# Patient Record
Sex: Female | Born: 1940
Health system: Southern US, Community
[De-identification: ages and names within clinical notes are randomized; demographics above are authoritative.]

## PROBLEM LIST (undated history)

## (undated) DIAGNOSIS — Z9889 Other specified postprocedural states: Secondary | ICD-10-CM

## (undated) DIAGNOSIS — K219 Gastro-esophageal reflux disease without esophagitis: Secondary | ICD-10-CM

## (undated) DIAGNOSIS — R0600 Dyspnea, unspecified: Secondary | ICD-10-CM

## (undated) DIAGNOSIS — M1990 Unspecified osteoarthritis, unspecified site: Secondary | ICD-10-CM

## (undated) DIAGNOSIS — Z8719 Personal history of other diseases of the digestive system: Secondary | ICD-10-CM

## (undated) DIAGNOSIS — E7800 Pure hypercholesterolemia, unspecified: Secondary | ICD-10-CM

## (undated) DIAGNOSIS — E039 Hypothyroidism, unspecified: Secondary | ICD-10-CM

## (undated) DIAGNOSIS — R112 Nausea with vomiting, unspecified: Secondary | ICD-10-CM

## (undated) DIAGNOSIS — D649 Anemia, unspecified: Secondary | ICD-10-CM

## (undated) HISTORY — DX: Pure hypercholesterolemia, unspecified: E78.00

## (undated) HISTORY — PX: COLONOSCOPY: SHX174

## (undated) HISTORY — PX: UPPER GASTROINTESTINAL ENDOSCOPY: SHX188

## (undated) HISTORY — DX: Hypothyroidism, unspecified: E03.9

---

## 1983-11-27 HISTORY — PX: TUBAL LIGATION: SHX77

## 1999-01-12 ENCOUNTER — Other Ambulatory Visit: Admission: RE | Admit: 1999-01-12 | Discharge: 1999-01-12 | Payer: Self-pay | Admitting: Obstetrics and Gynecology

## 2000-02-12 ENCOUNTER — Other Ambulatory Visit: Admission: RE | Admit: 2000-02-12 | Discharge: 2000-02-12 | Payer: Self-pay | Admitting: Obstetrics and Gynecology

## 2000-08-05 ENCOUNTER — Other Ambulatory Visit: Admission: RE | Admit: 2000-08-05 | Discharge: 2000-08-05 | Payer: Self-pay | Admitting: Obstetrics and Gynecology

## 2001-10-09 ENCOUNTER — Other Ambulatory Visit: Admission: RE | Admit: 2001-10-09 | Discharge: 2001-10-09 | Payer: Self-pay | Admitting: Obstetrics and Gynecology

## 2003-02-15 ENCOUNTER — Other Ambulatory Visit: Admission: RE | Admit: 2003-02-15 | Discharge: 2003-02-15 | Payer: Self-pay | Admitting: Obstetrics and Gynecology

## 2004-05-31 ENCOUNTER — Other Ambulatory Visit: Admission: RE | Admit: 2004-05-31 | Discharge: 2004-05-31 | Payer: Self-pay | Admitting: Obstetrics and Gynecology

## 2006-01-23 ENCOUNTER — Other Ambulatory Visit: Admission: RE | Admit: 2006-01-23 | Discharge: 2006-01-23 | Payer: Self-pay | Admitting: Obstetrics and Gynecology

## 2008-11-29 ENCOUNTER — Other Ambulatory Visit: Admission: RE | Admit: 2008-11-29 | Discharge: 2008-11-29 | Payer: Self-pay | Admitting: Obstetrics and Gynecology

## 2010-12-27 ENCOUNTER — Encounter: Payer: Self-pay | Admitting: Emergency Medicine

## 2012-01-10 DIAGNOSIS — IMO0001 Reserved for inherently not codable concepts without codable children: Secondary | ICD-10-CM | POA: Diagnosis not present

## 2012-01-10 DIAGNOSIS — M999 Biomechanical lesion, unspecified: Secondary | ICD-10-CM | POA: Diagnosis not present

## 2012-01-10 DIAGNOSIS — M461 Sacroiliitis, not elsewhere classified: Secondary | ICD-10-CM | POA: Diagnosis not present

## 2012-02-21 DIAGNOSIS — M999 Biomechanical lesion, unspecified: Secondary | ICD-10-CM | POA: Diagnosis not present

## 2012-02-21 DIAGNOSIS — M461 Sacroiliitis, not elsewhere classified: Secondary | ICD-10-CM | POA: Diagnosis not present

## 2012-02-21 DIAGNOSIS — IMO0001 Reserved for inherently not codable concepts without codable children: Secondary | ICD-10-CM | POA: Diagnosis not present

## 2012-07-14 DIAGNOSIS — Z79899 Other long term (current) drug therapy: Secondary | ICD-10-CM | POA: Diagnosis not present

## 2012-07-14 DIAGNOSIS — F329 Major depressive disorder, single episode, unspecified: Secondary | ICD-10-CM | POA: Diagnosis not present

## 2012-07-14 DIAGNOSIS — R9431 Abnormal electrocardiogram [ECG] [EKG]: Secondary | ICD-10-CM | POA: Diagnosis not present

## 2012-07-14 DIAGNOSIS — Z0000 Encounter for general adult medical examination without abnormal findings: Secondary | ICD-10-CM | POA: Diagnosis not present

## 2012-07-14 DIAGNOSIS — E559 Vitamin D deficiency, unspecified: Secondary | ICD-10-CM | POA: Diagnosis not present

## 2012-07-14 DIAGNOSIS — F3289 Other specified depressive episodes: Secondary | ICD-10-CM | POA: Diagnosis not present

## 2012-07-14 DIAGNOSIS — E785 Hyperlipidemia, unspecified: Secondary | ICD-10-CM | POA: Diagnosis not present

## 2012-07-30 DIAGNOSIS — E538 Deficiency of other specified B group vitamins: Secondary | ICD-10-CM | POA: Diagnosis not present

## 2012-08-06 DIAGNOSIS — Z23 Encounter for immunization: Secondary | ICD-10-CM | POA: Diagnosis not present

## 2012-08-06 DIAGNOSIS — E538 Deficiency of other specified B group vitamins: Secondary | ICD-10-CM | POA: Diagnosis not present

## 2012-08-07 DIAGNOSIS — Z1231 Encounter for screening mammogram for malignant neoplasm of breast: Secondary | ICD-10-CM | POA: Diagnosis not present

## 2012-08-11 DIAGNOSIS — R9431 Abnormal electrocardiogram [ECG] [EKG]: Secondary | ICD-10-CM | POA: Diagnosis not present

## 2012-09-23 DIAGNOSIS — Z01419 Encounter for gynecological examination (general) (routine) without abnormal findings: Secondary | ICD-10-CM | POA: Diagnosis not present

## 2012-09-23 DIAGNOSIS — Z124 Encounter for screening for malignant neoplasm of cervix: Secondary | ICD-10-CM | POA: Diagnosis not present

## 2012-09-23 DIAGNOSIS — Z0000 Encounter for general adult medical examination without abnormal findings: Secondary | ICD-10-CM | POA: Diagnosis not present

## 2013-03-16 DIAGNOSIS — M5412 Radiculopathy, cervical region: Secondary | ICD-10-CM | POA: Diagnosis not present

## 2013-03-16 DIAGNOSIS — M999 Biomechanical lesion, unspecified: Secondary | ICD-10-CM | POA: Diagnosis not present

## 2013-03-16 DIAGNOSIS — IMO0001 Reserved for inherently not codable concepts without codable children: Secondary | ICD-10-CM | POA: Diagnosis not present

## 2013-03-16 DIAGNOSIS — IMO0002 Reserved for concepts with insufficient information to code with codable children: Secondary | ICD-10-CM | POA: Diagnosis not present

## 2013-03-16 DIAGNOSIS — M9981 Other biomechanical lesions of cervical region: Secondary | ICD-10-CM | POA: Diagnosis not present

## 2013-08-20 DIAGNOSIS — Z1231 Encounter for screening mammogram for malignant neoplasm of breast: Secondary | ICD-10-CM | POA: Diagnosis not present

## 2013-08-31 DIAGNOSIS — E079 Disorder of thyroid, unspecified: Secondary | ICD-10-CM | POA: Diagnosis not present

## 2013-08-31 DIAGNOSIS — Z0000 Encounter for general adult medical examination without abnormal findings: Secondary | ICD-10-CM | POA: Diagnosis not present

## 2013-08-31 DIAGNOSIS — E785 Hyperlipidemia, unspecified: Secondary | ICD-10-CM | POA: Diagnosis not present

## 2013-08-31 DIAGNOSIS — E559 Vitamin D deficiency, unspecified: Secondary | ICD-10-CM | POA: Diagnosis not present

## 2013-08-31 DIAGNOSIS — E538 Deficiency of other specified B group vitamins: Secondary | ICD-10-CM | POA: Diagnosis not present

## 2013-09-07 DIAGNOSIS — E559 Vitamin D deficiency, unspecified: Secondary | ICD-10-CM | POA: Diagnosis not present

## 2013-09-07 DIAGNOSIS — E538 Deficiency of other specified B group vitamins: Secondary | ICD-10-CM | POA: Diagnosis not present

## 2013-09-07 DIAGNOSIS — Z1331 Encounter for screening for depression: Secondary | ICD-10-CM | POA: Diagnosis not present

## 2013-09-07 DIAGNOSIS — N951 Menopausal and female climacteric states: Secondary | ICD-10-CM | POA: Diagnosis not present

## 2013-09-07 DIAGNOSIS — F329 Major depressive disorder, single episode, unspecified: Secondary | ICD-10-CM | POA: Diagnosis not present

## 2013-09-07 DIAGNOSIS — IMO0002 Reserved for concepts with insufficient information to code with codable children: Secondary | ICD-10-CM | POA: Diagnosis not present

## 2013-09-07 DIAGNOSIS — E079 Disorder of thyroid, unspecified: Secondary | ICD-10-CM | POA: Diagnosis not present

## 2013-09-07 DIAGNOSIS — F3289 Other specified depressive episodes: Secondary | ICD-10-CM | POA: Diagnosis not present

## 2013-09-07 DIAGNOSIS — Z0000 Encounter for general adult medical examination without abnormal findings: Secondary | ICD-10-CM | POA: Diagnosis not present

## 2013-09-07 DIAGNOSIS — E785 Hyperlipidemia, unspecified: Secondary | ICD-10-CM | POA: Diagnosis not present

## 2013-09-16 DIAGNOSIS — Z1212 Encounter for screening for malignant neoplasm of rectum: Secondary | ICD-10-CM | POA: Diagnosis not present

## 2013-10-26 ENCOUNTER — Telehealth: Payer: Self-pay | Admitting: Gynecology

## 2013-10-26 MED ORDER — ESTROPIPATE 0.75 MG PO TABS: 0.7500 mg | ORAL_TABLET | Freq: Every day | ORAL | Status: DC

## 2013-10-26 MED ORDER — MEDROXYPROGESTERONE ACETATE 2.5 MG PO TABS: 2.5000 mg | ORAL_TABLET | Freq: Every day | ORAL | Status: DC

## 2013-10-26 NOTE — Telephone Encounter (Signed)
AEX was 09/23/12 #90 with refills x 1 year was sent for both the Provera and Ortho Est.  Aex scheduled for 12/14/13 with Dr. Reina Fuse Patient had 3D mammogram done 08/20/13, requested to have MMG sent over to [place].  Patient notified of refills being sent.

## 2013-10-26 NOTE — Telephone Encounter (Signed)
Patient needs refill for  Medroxyprogesterone 2.5 mg Estroipate 0.75 mg  [facility] on [address]  Has annual scheduled for 12/14/13 with lathrop

## 2013-11-26 DIAGNOSIS — E039 Hypothyroidism, unspecified: Secondary | ICD-10-CM | POA: Diagnosis present

## 2013-11-26 HISTORY — DX: Hypothyroidism, unspecified: E03.9

## 2013-12-14 ENCOUNTER — Ambulatory Visit: Payer: Self-pay | Admitting: Gynecology

## 2014-01-01 ENCOUNTER — Encounter: Payer: Self-pay | Admitting: Obstetrics and Gynecology

## 2014-01-04 ENCOUNTER — Ambulatory Visit: Payer: Self-pay | Admitting: Gynecology

## 2014-01-08 ENCOUNTER — Ambulatory Visit: Payer: Self-pay | Admitting: Gynecology

## 2014-01-13 ENCOUNTER — Ambulatory Visit: Payer: Self-pay | Admitting: Gynecology

## 2014-01-13 ENCOUNTER — Encounter: Payer: Self-pay | Admitting: Gynecology

## 2014-01-13 ENCOUNTER — Ambulatory Visit (INDEPENDENT_AMBULATORY_CARE_PROVIDER_SITE_OTHER): Payer: Medicare Other | Admitting: Gynecology

## 2014-01-13 VITALS — BP 121/85 | HR 61 | Resp 12 | Ht 66.75 in | Wt 152.0 lb

## 2014-01-13 DIAGNOSIS — Z01419 Encounter for gynecological examination (general) (routine) without abnormal findings: Secondary | ICD-10-CM

## 2014-01-13 DIAGNOSIS — Z124 Encounter for screening for malignant neoplasm of cervix: Secondary | ICD-10-CM

## 2014-01-13 DIAGNOSIS — Z79890 Hormone replacement therapy (postmenopausal): Secondary | ICD-10-CM

## 2014-01-13 MED ORDER — ESTROPIPATE 0.75 MG PO TABS: 0.7500 mg | ORAL_TABLET | Freq: Every day | ORAL | Status: DC

## 2014-01-13 MED ORDER — MEDROXYPROGESTERONE ACETATE 2.5 MG PO TABS: 2.5000 mg | ORAL_TABLET | Freq: Every day | ORAL | Status: DC

## 2014-01-13 NOTE — Patient Instructions (Signed)

## 2014-01-13 NOTE — Progress Notes (Signed)
73 y.o. Married Caucasian female   G2P2002 here for annual exam. Pt reports menses are absent due to menopause. She does have night sweats. She does not report post-menopasual bleeding.  Pt is on HRT since menopause.  She reports very happy on regimen and is not interested in stopping.  Pt reports no bleeding.  Had all her labs done with Dr Wellington Hampshire.  Pt refused colonoscopy, had hemocult done with PCP  No LMP recorded.          Sexually active: yes  The current method of family planning is post menopausal status.    Exercising: yes  gym 3-4x/wk Last pap: 06/08/11 Negative Abnormal PAP: no Mammogram: 08/20/13 Bi-Rads 1 BSE: yes  Colonoscopy: none DEXA: 05/31/11  Alcohol: yes 1-2 drinks/wk Tobacco: no  Labs: Crist Infante, MD  Health Maintenance  Topic Date Due  . Colonoscopy  05/27/1991  . Zostavax  05/26/2001  . Pneumococcal Polysaccharide Vaccine Age 28 And Over  05/26/2006  . Influenza Vaccine  06/26/2013  . Mammogram  08/21/2015  . Tetanus/tdap  11/27/2015    No family history on file.  There are no active problems to display for this patient.   No past medical history on file.  Past Surgical History  Procedure Laterality Date  . Tubal ligation  1985    Allergies: Review of patient's allergies indicates no known allergies.  Current Outpatient Prescriptions  Medication Sig Dispense Refill  . Atorvastatin Calcium (LIPITOR PO) Take by mouth.      . calcium carbonate (OS-CAL) 600 MG TABS tablet Take 600 mg by mouth 2 (two) times daily with a meal.      . Cholecalciferol (VITAMIN D PO) Take by mouth.      . Cyanocobalamin (B-12) 1000 MCG CAPS Take by mouth.      . estropipate (ORTHO-EST 0.625) 0.75 MG tablet Take 1 tablet (0.75 mg total) by mouth daily.  90 tablet  0  . medroxyPROGESTERone (PROVERA) 2.5 MG tablet Take 1 tablet (2.5 mg total) by mouth daily.  90 tablet  0  . Multiple Vitamins-Minerals (MULTIVITAMIN PO) Take by mouth.       No current facility-administered  medications for this visit.    ROS: Pertinent items are noted in HPI.  Exam:    There were no vitals taken for this visit. Weight change: @WEIGHTCHANGE @ Last 3 height recordings:  Ht Readings from Last 3 Encounters:  No data found for Ht   General appearance: alert, cooperative and appears stated age Head: Normocephalic, without obvious abnormality, atraumatic Neck: no adenopathy, no carotid bruit, no JVD, supple, symmetrical, trachea midline and thyroid not enlarged, symmetric, no tenderness/mass/nodules Lungs: clear to auscultation bilaterally Breasts: normal appearance, no masses or tenderness Heart: regular rate and rhythm, S1, S2 normal, no murmur, click, rub or gallop Abdomen: soft, non-tender; bowel sounds normal; no masses,  no organomegaly Extremities: extremities normal, atraumatic, no cyanosis or edema Skin: Skin color, texture, turgor normal. No rashes or lesions Lymph nodes: Cervical, supraclavicular, and axillary nodes normal. no inguinal nodes palpated Neurologic: Grossly normal   Pelvic: External genitalia:  no lesions              Urethra: normal appearing urethra with no masses, tenderness or lesions              Bartholins and Skenes: normal                 Vagina: atrophic  Cervix: normal appearance              Pap taken: no        Bimanual Exam:  Uterus:  uterus is normal size, shape, consistency and nontender                                      Adnexa:    no masses                                      Rectovaginal: Confirms                                      Anus:  normal sphincter tone, no lesions  A: well woman no contraindication to continue hormonal therapy      P: mammogram annually Recommend colon cancer screening beyond hemocult cards-pt refused Discussed recent guidelines regarding pelvic exams and ACOG's position Pt is not interested in stopping her hormones, we discussed the the findings of the WHI and ACOG's position, pt  understands these risks but is not interested in stopping.  Non-hormonal medications were also reviewed. counseled on breast self exam, mammography screening, adequate intake of calcium and vitamin D, diet and exercise return annually or prn Discussed PAP guideline changes, importance of weight bearing exercises, calcium, vit D and balanced diet.  An After Visit Summary was printed and given to the patient.

## 2014-04-14 DIAGNOSIS — B351 Tinea unguium: Secondary | ICD-10-CM | POA: Diagnosis not present

## 2014-04-14 DIAGNOSIS — D485 Neoplasm of uncertain behavior of skin: Secondary | ICD-10-CM | POA: Diagnosis not present

## 2014-04-14 DIAGNOSIS — C44519 Basal cell carcinoma of skin of other part of trunk: Secondary | ICD-10-CM | POA: Diagnosis not present

## 2014-04-14 DIAGNOSIS — L609 Nail disorder, unspecified: Secondary | ICD-10-CM | POA: Diagnosis not present

## 2014-05-19 DIAGNOSIS — C44519 Basal cell carcinoma of skin of other part of trunk: Secondary | ICD-10-CM | POA: Diagnosis not present

## 2014-05-19 DIAGNOSIS — B351 Tinea unguium: Secondary | ICD-10-CM | POA: Diagnosis not present

## 2014-05-19 DIAGNOSIS — Z79899 Other long term (current) drug therapy: Secondary | ICD-10-CM | POA: Diagnosis not present

## 2014-06-16 DIAGNOSIS — Z79899 Other long term (current) drug therapy: Secondary | ICD-10-CM | POA: Diagnosis not present

## 2014-08-16 DIAGNOSIS — M5412 Radiculopathy, cervical region: Secondary | ICD-10-CM | POA: Diagnosis not present

## 2014-08-16 DIAGNOSIS — M999 Biomechanical lesion, unspecified: Secondary | ICD-10-CM | POA: Diagnosis not present

## 2014-08-16 DIAGNOSIS — IMO0001 Reserved for inherently not codable concepts without codable children: Secondary | ICD-10-CM | POA: Diagnosis not present

## 2014-08-16 DIAGNOSIS — M9981 Other biomechanical lesions of cervical region: Secondary | ICD-10-CM | POA: Diagnosis not present

## 2014-08-16 DIAGNOSIS — IMO0002 Reserved for concepts with insufficient information to code with codable children: Secondary | ICD-10-CM | POA: Diagnosis not present

## 2014-09-27 ENCOUNTER — Encounter: Payer: Self-pay | Admitting: Gynecology

## 2014-09-29 DIAGNOSIS — E785 Hyperlipidemia, unspecified: Secondary | ICD-10-CM | POA: Diagnosis not present

## 2014-09-29 DIAGNOSIS — Z008 Encounter for other general examination: Secondary | ICD-10-CM | POA: Diagnosis not present

## 2014-09-29 DIAGNOSIS — E559 Vitamin D deficiency, unspecified: Secondary | ICD-10-CM | POA: Diagnosis not present

## 2014-09-29 DIAGNOSIS — E538 Deficiency of other specified B group vitamins: Secondary | ICD-10-CM | POA: Diagnosis not present

## 2014-10-06 DIAGNOSIS — E785 Hyperlipidemia, unspecified: Secondary | ICD-10-CM | POA: Diagnosis not present

## 2014-10-06 DIAGNOSIS — E538 Deficiency of other specified B group vitamins: Secondary | ICD-10-CM | POA: Diagnosis not present

## 2014-10-06 DIAGNOSIS — N951 Menopausal and female climacteric states: Secondary | ICD-10-CM | POA: Diagnosis not present

## 2014-10-06 DIAGNOSIS — Z6822 Body mass index (BMI) 22.0-22.9, adult: Secondary | ICD-10-CM | POA: Diagnosis not present

## 2014-10-06 DIAGNOSIS — E559 Vitamin D deficiency, unspecified: Secondary | ICD-10-CM | POA: Diagnosis not present

## 2014-10-06 DIAGNOSIS — Z0000 Encounter for general adult medical examination without abnormal findings: Secondary | ICD-10-CM | POA: Diagnosis not present

## 2014-10-06 DIAGNOSIS — Z780 Asymptomatic menopausal state: Secondary | ICD-10-CM | POA: Diagnosis not present

## 2014-10-06 DIAGNOSIS — E039 Hypothyroidism, unspecified: Secondary | ICD-10-CM | POA: Diagnosis not present

## 2014-10-06 DIAGNOSIS — Z1212 Encounter for screening for malignant neoplasm of rectum: Secondary | ICD-10-CM | POA: Diagnosis not present

## 2014-10-06 DIAGNOSIS — Z1389 Encounter for screening for other disorder: Secondary | ICD-10-CM | POA: Diagnosis not present

## 2014-10-06 DIAGNOSIS — F329 Major depressive disorder, single episode, unspecified: Secondary | ICD-10-CM | POA: Diagnosis not present

## 2014-10-06 DIAGNOSIS — Z23 Encounter for immunization: Secondary | ICD-10-CM | POA: Diagnosis not present

## 2014-10-07 DIAGNOSIS — M9905 Segmental and somatic dysfunction of pelvic region: Secondary | ICD-10-CM | POA: Diagnosis not present

## 2014-10-07 DIAGNOSIS — M9903 Segmental and somatic dysfunction of lumbar region: Secondary | ICD-10-CM | POA: Diagnosis not present

## 2014-10-07 DIAGNOSIS — M5414 Radiculopathy, thoracic region: Secondary | ICD-10-CM | POA: Diagnosis not present

## 2014-10-07 DIAGNOSIS — M609 Myositis, unspecified: Secondary | ICD-10-CM | POA: Diagnosis not present

## 2014-10-07 DIAGNOSIS — M9901 Segmental and somatic dysfunction of cervical region: Secondary | ICD-10-CM | POA: Diagnosis not present

## 2014-10-07 DIAGNOSIS — M9902 Segmental and somatic dysfunction of thoracic region: Secondary | ICD-10-CM | POA: Diagnosis not present

## 2014-10-07 DIAGNOSIS — M5412 Radiculopathy, cervical region: Secondary | ICD-10-CM | POA: Diagnosis not present

## 2014-10-07 DIAGNOSIS — M7061 Trochanteric bursitis, right hip: Secondary | ICD-10-CM | POA: Diagnosis not present

## 2015-01-13 DIAGNOSIS — M609 Myositis, unspecified: Secondary | ICD-10-CM | POA: Diagnosis not present

## 2015-01-13 DIAGNOSIS — M5414 Radiculopathy, thoracic region: Secondary | ICD-10-CM | POA: Diagnosis not present

## 2015-01-13 DIAGNOSIS — M9903 Segmental and somatic dysfunction of lumbar region: Secondary | ICD-10-CM | POA: Diagnosis not present

## 2015-01-13 DIAGNOSIS — M9901 Segmental and somatic dysfunction of cervical region: Secondary | ICD-10-CM | POA: Diagnosis not present

## 2015-01-13 DIAGNOSIS — M9902 Segmental and somatic dysfunction of thoracic region: Secondary | ICD-10-CM | POA: Diagnosis not present

## 2015-01-13 DIAGNOSIS — M5412 Radiculopathy, cervical region: Secondary | ICD-10-CM | POA: Diagnosis not present

## 2015-01-21 DIAGNOSIS — H35363 Drusen (degenerative) of macula, bilateral: Secondary | ICD-10-CM | POA: Diagnosis not present

## 2015-01-21 DIAGNOSIS — H2513 Age-related nuclear cataract, bilateral: Secondary | ICD-10-CM | POA: Diagnosis not present

## 2015-01-24 DIAGNOSIS — E785 Hyperlipidemia, unspecified: Secondary | ICD-10-CM | POA: Diagnosis not present

## 2015-01-24 DIAGNOSIS — E039 Hypothyroidism, unspecified: Secondary | ICD-10-CM | POA: Diagnosis not present

## 2015-01-31 ENCOUNTER — Telehealth: Payer: Self-pay | Admitting: Obstetrics and Gynecology

## 2015-01-31 DIAGNOSIS — Z79890 Hormone replacement therapy (postmenopausal): Secondary | ICD-10-CM

## 2015-01-31 MED ORDER — MEDROXYPROGESTERONE ACETATE 2.5 MG PO TABS: 2.5000 mg | ORAL_TABLET | Freq: Every day | ORAL | Status: DC

## 2015-01-31 MED ORDER — ESTROPIPATE 0.75 MG PO TABS: 0.7500 mg | ORAL_TABLET | Freq: Every day | ORAL | Status: DC

## 2015-01-31 NOTE — Telephone Encounter (Signed)
Patient is a former Location manager patient. She states that she needs to have her hormone prescription refilled and needs an appt

## 2015-01-31 NOTE — Telephone Encounter (Signed)
Left message to call Kaitlyn at [phone]. 

## 2015-01-31 NOTE — Telephone Encounter (Signed)
Spoke with patient. Patient was last seen 01/13/2014 with Dr.Lathrop. Patient would like to schedule aex and is requesting refills on HRT. Patient has mammogram scheduled with [facility] on 3/10 at 3:30pm. Currently taking Ortho-est 0.75mg  daily and Provera 2.5mg  daily. Last mammogram was 08/20/13 Bi-Rads 1. "What am I going to do about my hormones until I can get in for an appointment?" Patient is going out of town until the first week in April. Appointment scheduled for 03/04/2015 at 10am with Dr.Silva. Patient is agreeable to date and time. Advised will have Dr.Silva review and return call in regards to medication refill. Advised patient final report from [place] will need to be sent to the office for Dr.Silva's review. Patient is agreeable.

## 2015-01-31 NOTE — Telephone Encounter (Signed)
Ok to refill Ortho-est and provera for one month only.  This should give her enough to take until her next visit.  I will be happy to see patient in April for her annual exam as scheduled.

## 2015-02-03 DIAGNOSIS — Z1231 Encounter for screening mammogram for malignant neoplasm of breast: Secondary | ICD-10-CM | POA: Diagnosis not present

## 2015-02-03 NOTE — Telephone Encounter (Signed)
Left message to call Kaitlyn at [phone]. 

## 2015-02-08 NOTE — Telephone Encounter (Signed)
Spoke with patient. Advised patient of Ortho-est and Provera sent in for one month until annual by Dr.Silva. Patient is agreeable.   Routing to provider for final review. Patient agreeable to disposition. Will close encounter

## 2015-02-08 NOTE — Telephone Encounter (Signed)
Patient is returning a call to Kaitlyn. °

## 2015-02-08 NOTE — Telephone Encounter (Signed)
Left message to call Isel Skufca at [phone]. 

## 2015-02-14 DIAGNOSIS — R51 Headache: Secondary | ICD-10-CM | POA: Diagnosis not present

## 2015-02-14 DIAGNOSIS — Z6823 Body mass index (BMI) 23.0-23.9, adult: Secondary | ICD-10-CM | POA: Diagnosis not present

## 2015-02-28 ENCOUNTER — Other Ambulatory Visit: Payer: Self-pay | Admitting: Internal Medicine

## 2015-02-28 DIAGNOSIS — R51 Headache: Principal | ICD-10-CM

## 2015-02-28 DIAGNOSIS — R519 Headache, unspecified: Secondary | ICD-10-CM

## 2015-03-04 ENCOUNTER — Ambulatory Visit (INDEPENDENT_AMBULATORY_CARE_PROVIDER_SITE_OTHER): Payer: Medicare Other | Admitting: Obstetrics and Gynecology

## 2015-03-04 ENCOUNTER — Ambulatory Visit
Admission: RE | Admit: 2015-03-04 | Discharge: 2015-03-04 | Disposition: A | Discharge status: Home / Self Care | Payer: Medicare Other | Source: Ambulatory Visit | Attending: Internal Medicine | Admitting: Internal Medicine

## 2015-03-04 ENCOUNTER — Encounter: Payer: Self-pay | Admitting: Obstetrics and Gynecology

## 2015-03-04 VITALS — BP 100/80 | HR 64 | Resp 16 | Ht 67.0 in | Wt 149.0 lb

## 2015-03-04 DIAGNOSIS — I6782 Cerebral ischemia: Secondary | ICD-10-CM | POA: Diagnosis not present

## 2015-03-04 DIAGNOSIS — R51 Headache: Principal | ICD-10-CM

## 2015-03-04 DIAGNOSIS — R519 Headache, unspecified: Secondary | ICD-10-CM

## 2015-03-04 DIAGNOSIS — Z124 Encounter for screening for malignant neoplasm of cervix: Secondary | ICD-10-CM | POA: Diagnosis not present

## 2015-03-04 DIAGNOSIS — Z01419 Encounter for gynecological examination (general) (routine) without abnormal findings: Secondary | ICD-10-CM

## 2015-03-04 DIAGNOSIS — Z79890 Hormone replacement therapy (postmenopausal): Secondary | ICD-10-CM | POA: Diagnosis not present

## 2015-03-04 MED ORDER — MEDROXYPROGESTERONE ACETATE 2.5 MG PO TABS: 2.5000 mg | ORAL_TABLET | Freq: Every day | ORAL | Status: AC

## 2015-03-04 MED ORDER — ESTROPIPATE 0.75 MG PO TABS: 0.7500 mg | ORAL_TABLET | Freq: Every day | ORAL | Status: DC

## 2015-03-04 NOTE — Patient Instructions (Signed)

## 2015-03-04 NOTE — Progress Notes (Signed)
74 y.o. Monica White UnknownCaucasianF here for annual exam.    On HRT.  Still having hot flashes.  Wants it to treat hot flashes.  Patient is having MRI of the brain today due to headaches.  Waking up with headache occasionally.  Treated for sinusitis.   Has son and daughter.   Patient's last menstrual period was 01/13/1994.          Sexually active: Yes.    The current method of family planning is tubal ligation and post menopausal.    Exercising: Yes.    gym, golf and tennis Smoker:  no  Health Maintenance: Pap: 05/2011 Neg History of abnormal Pap:  no MMG: 02/03/2015 BIRADS1:Neg Self Breast Exam: yes, once or twice a month. Colonoscopy:Never BMD:  2015 - Normal  TDaP:  2007 Screening Labs: PCP, Hb today: PCP, Urine today: PCP   reports that she has never smoked. She has never used smokeless tobacco. She reports that she drinks about 0.5 - 1.0 oz of alcohol per week. She reports that she does not use illicit drugs.  Past Medical History  Diagnosis Date  . Hypothyroid 2015  . Hypercholesteremia     Past Surgical History  Procedure Laterality Date  . Tubal ligation  1985    Current Outpatient Prescriptions  Medication Sig Dispense Refill  . calcium carbonate (OS-CAL) 600 MG TABS tablet Take 600 mg by mouth 2 (two) times daily with a meal.    . Cholecalciferol (VITAMIN D PO) Take by mouth.    . Cyanocobalamin (B-12) 1000 MCG CAPS Take by mouth.    . estropipate (ORTHO-EST 0.625) 0.75 MG tablet Take 1 tablet (0.75 mg total) by mouth daily. 30 tablet 0  . levothyroxine (SYNTHROID, LEVOTHROID) 50 MCG tablet Take 1 tablet by mouth daily.  11  . medroxyPROGESTERone (PROVERA) 2.5 MG tablet Take 1 tablet (2.5 mg total) by mouth daily. 30 tablet 0  . Multiple Vitamins-Minerals (MULTIVITAMIN PO) Take by mouth.    . pravastatin (PRAVACHOL) 20 MG tablet Take 1 tablet by mouth. 3 x weekly  11   No current facility-administered medications for this visit.    Family History   Problem Relation Age of Onset  . Heart disease Mother     ROS:  Pertinent items are noted in HPI.  Otherwise, a comprehensive ROS was negative.  Exam:   BP 100/80 mmHg  Pulse 64  Resp 16  Ht 5\' 7"  (1.702 m)  Wt 149 lb (67.586 kg)  BMI 23.33 kg/m2  LMP 01/13/1994     Height: 5\' 7"  (170.2 cm)  Ht Readings from Last 3 Encounters:  03/04/15 5\' 7"  (1.702 m)  01/13/14 5' 6.75" (1.695 m)    General appearance: alert, cooperative and appears stated age Head: Normocephalic, without obvious abnormality, atraumatic Neck: no adenopathy, supple, symmetrical, trachea midline and thyroid normal to inspection and palpation Lungs: clear to auscultation bilaterally Breasts: normal appearance, no masses or tenderness, Inspection negative, No nipple retraction or dimpling, No nipple discharge or bleeding, No axillary or supraclavicular adenopathy Heart: regular rate and rhythm Abdomen: soft, non-tender; bowel sounds normal; no masses,  no organomegaly Extremities: extremities normal, atraumatic, no cyanosis or edema Skin: Skin color, texture, turgor normal. No rashes or lesions Lymph nodes: Cervical, supraclavicular, and axillary nodes normal. No abnormal inguinal nodes palpated Neurologic: Grossly normal   Pelvic: External genitalia:  no lesions              Urethra:  normal appearing urethra with no masses, tenderness or  lesions              Bartholins and Skenes: normal                 Vagina: normal appearing vagina with normal color and discharge, no lesions              Cervix: no lesions              Pap taken: Yes.   Bimanual Exam:  Uterus:  normal size, contour, position, consistency, mobility, non-tender              Adnexa: normal adnexa and no mass, fullness, tenderness               Rectovaginal: Confirms               Anus:  normal sphincter tone, no lesions  Chaperone was present for exam.  A:  Well Woman with normal exam HRT patient.  Headaches.   P:   Mammogram  recommended yearly. pap smear done.  Discussed Women's Health Initiative - risks and benefits of HRT.  Discussed risk of MI, stroke, PE, DVT, breast cancer.  Wishes to continue HRT.  See orders for OrthoEst and Provera. Colonoscopy recommended.  MRI of brain as ordered by PCP.  Routine labs with PCP.  return annually or prn

## 2015-03-07 LAB — IPS PAP SMEAR ONLY

## 2015-10-14 DIAGNOSIS — E039 Hypothyroidism, unspecified: Secondary | ICD-10-CM | POA: Diagnosis not present

## 2015-10-14 DIAGNOSIS — E538 Deficiency of other specified B group vitamins: Secondary | ICD-10-CM | POA: Diagnosis not present

## 2015-10-14 DIAGNOSIS — E559 Vitamin D deficiency, unspecified: Secondary | ICD-10-CM | POA: Diagnosis not present

## 2015-10-14 DIAGNOSIS — E785 Hyperlipidemia, unspecified: Secondary | ICD-10-CM | POA: Diagnosis not present

## 2015-10-27 DIAGNOSIS — E038 Other specified hypothyroidism: Secondary | ICD-10-CM | POA: Diagnosis not present

## 2015-10-27 DIAGNOSIS — Z0000 Encounter for general adult medical examination without abnormal findings: Secondary | ICD-10-CM | POA: Diagnosis not present

## 2015-10-27 DIAGNOSIS — E559 Vitamin D deficiency, unspecified: Secondary | ICD-10-CM | POA: Diagnosis not present

## 2015-10-27 DIAGNOSIS — I872 Venous insufficiency (chronic) (peripheral): Secondary | ICD-10-CM | POA: Diagnosis not present

## 2015-10-27 DIAGNOSIS — N951 Menopausal and female climacteric states: Secondary | ICD-10-CM | POA: Diagnosis not present

## 2015-10-27 DIAGNOSIS — Z23 Encounter for immunization: Secondary | ICD-10-CM | POA: Diagnosis not present

## 2015-10-27 DIAGNOSIS — Z6823 Body mass index (BMI) 23.0-23.9, adult: Secondary | ICD-10-CM | POA: Diagnosis not present

## 2015-10-27 DIAGNOSIS — E784 Other hyperlipidemia: Secondary | ICD-10-CM | POA: Diagnosis not present

## 2015-10-27 DIAGNOSIS — R51 Headache: Secondary | ICD-10-CM | POA: Diagnosis not present

## 2015-10-27 DIAGNOSIS — Z1389 Encounter for screening for other disorder: Secondary | ICD-10-CM | POA: Diagnosis not present

## 2015-10-27 DIAGNOSIS — E538 Deficiency of other specified B group vitamins: Secondary | ICD-10-CM | POA: Diagnosis not present

## 2015-10-27 DIAGNOSIS — F329 Major depressive disorder, single episode, unspecified: Secondary | ICD-10-CM | POA: Diagnosis not present

## 2015-10-28 DIAGNOSIS — Z1212 Encounter for screening for malignant neoplasm of rectum: Secondary | ICD-10-CM | POA: Diagnosis not present

## 2015-11-23 ENCOUNTER — Other Ambulatory Visit: Payer: Self-pay | Admitting: Internal Medicine

## 2015-11-23 DIAGNOSIS — G4489 Other headache syndrome: Secondary | ICD-10-CM

## 2016-02-06 DIAGNOSIS — M9901 Segmental and somatic dysfunction of cervical region: Secondary | ICD-10-CM | POA: Diagnosis not present

## 2016-02-06 DIAGNOSIS — M545 Low back pain: Secondary | ICD-10-CM | POA: Diagnosis not present

## 2016-02-06 DIAGNOSIS — M62830 Muscle spasm of back: Secondary | ICD-10-CM | POA: Diagnosis not present

## 2016-02-06 DIAGNOSIS — Z1231 Encounter for screening mammogram for malignant neoplasm of breast: Secondary | ICD-10-CM | POA: Diagnosis not present

## 2016-02-06 DIAGNOSIS — M9903 Segmental and somatic dysfunction of lumbar region: Secondary | ICD-10-CM | POA: Diagnosis not present

## 2016-02-06 DIAGNOSIS — M41126 Adolescent idiopathic scoliosis, lumbar region: Secondary | ICD-10-CM | POA: Diagnosis not present

## 2016-02-06 DIAGNOSIS — M9905 Segmental and somatic dysfunction of pelvic region: Secondary | ICD-10-CM | POA: Diagnosis not present

## 2016-02-08 ENCOUNTER — Inpatient Hospital Stay: Admission: RE | Admit: 2016-02-08 | Payer: Medicare Other | Source: Ambulatory Visit

## 2016-02-08 DIAGNOSIS — M9901 Segmental and somatic dysfunction of cervical region: Secondary | ICD-10-CM | POA: Diagnosis not present

## 2016-02-08 DIAGNOSIS — M9903 Segmental and somatic dysfunction of lumbar region: Secondary | ICD-10-CM | POA: Diagnosis not present

## 2016-02-08 DIAGNOSIS — M545 Low back pain: Secondary | ICD-10-CM | POA: Diagnosis not present

## 2016-02-08 DIAGNOSIS — M62830 Muscle spasm of back: Secondary | ICD-10-CM | POA: Diagnosis not present

## 2016-02-08 DIAGNOSIS — M9905 Segmental and somatic dysfunction of pelvic region: Secondary | ICD-10-CM | POA: Diagnosis not present

## 2016-02-08 DIAGNOSIS — M41126 Adolescent idiopathic scoliosis, lumbar region: Secondary | ICD-10-CM | POA: Diagnosis not present

## 2016-02-09 DIAGNOSIS — M9905 Segmental and somatic dysfunction of pelvic region: Secondary | ICD-10-CM | POA: Diagnosis not present

## 2016-02-09 DIAGNOSIS — M545 Low back pain: Secondary | ICD-10-CM | POA: Diagnosis not present

## 2016-02-09 DIAGNOSIS — M62830 Muscle spasm of back: Secondary | ICD-10-CM | POA: Diagnosis not present

## 2016-02-09 DIAGNOSIS — M9901 Segmental and somatic dysfunction of cervical region: Secondary | ICD-10-CM | POA: Diagnosis not present

## 2016-02-09 DIAGNOSIS — M41126 Adolescent idiopathic scoliosis, lumbar region: Secondary | ICD-10-CM | POA: Diagnosis not present

## 2016-02-09 DIAGNOSIS — M9903 Segmental and somatic dysfunction of lumbar region: Secondary | ICD-10-CM | POA: Diagnosis not present

## 2016-02-13 DIAGNOSIS — M41126 Adolescent idiopathic scoliosis, lumbar region: Secondary | ICD-10-CM | POA: Diagnosis not present

## 2016-02-13 DIAGNOSIS — M62830 Muscle spasm of back: Secondary | ICD-10-CM | POA: Diagnosis not present

## 2016-02-13 DIAGNOSIS — M9905 Segmental and somatic dysfunction of pelvic region: Secondary | ICD-10-CM | POA: Diagnosis not present

## 2016-02-13 DIAGNOSIS — M9901 Segmental and somatic dysfunction of cervical region: Secondary | ICD-10-CM | POA: Diagnosis not present

## 2016-02-13 DIAGNOSIS — M9903 Segmental and somatic dysfunction of lumbar region: Secondary | ICD-10-CM | POA: Diagnosis not present

## 2016-02-13 DIAGNOSIS — M545 Low back pain: Secondary | ICD-10-CM | POA: Diagnosis not present

## 2016-02-15 DIAGNOSIS — M545 Low back pain: Secondary | ICD-10-CM | POA: Diagnosis not present

## 2016-02-15 DIAGNOSIS — M9905 Segmental and somatic dysfunction of pelvic region: Secondary | ICD-10-CM | POA: Diagnosis not present

## 2016-02-15 DIAGNOSIS — M41126 Adolescent idiopathic scoliosis, lumbar region: Secondary | ICD-10-CM | POA: Diagnosis not present

## 2016-02-15 DIAGNOSIS — M9901 Segmental and somatic dysfunction of cervical region: Secondary | ICD-10-CM | POA: Diagnosis not present

## 2016-02-15 DIAGNOSIS — M9903 Segmental and somatic dysfunction of lumbar region: Secondary | ICD-10-CM | POA: Diagnosis not present

## 2016-02-15 DIAGNOSIS — M62830 Muscle spasm of back: Secondary | ICD-10-CM | POA: Diagnosis not present

## 2016-02-16 DIAGNOSIS — M545 Low back pain: Secondary | ICD-10-CM | POA: Diagnosis not present

## 2016-02-16 DIAGNOSIS — M9901 Segmental and somatic dysfunction of cervical region: Secondary | ICD-10-CM | POA: Diagnosis not present

## 2016-02-16 DIAGNOSIS — M62830 Muscle spasm of back: Secondary | ICD-10-CM | POA: Diagnosis not present

## 2016-02-16 DIAGNOSIS — M41126 Adolescent idiopathic scoliosis, lumbar region: Secondary | ICD-10-CM | POA: Diagnosis not present

## 2016-02-16 DIAGNOSIS — M9903 Segmental and somatic dysfunction of lumbar region: Secondary | ICD-10-CM | POA: Diagnosis not present

## 2016-02-16 DIAGNOSIS — M9905 Segmental and somatic dysfunction of pelvic region: Secondary | ICD-10-CM | POA: Diagnosis not present

## 2016-02-29 DIAGNOSIS — M9901 Segmental and somatic dysfunction of cervical region: Secondary | ICD-10-CM | POA: Diagnosis not present

## 2016-02-29 DIAGNOSIS — M545 Low back pain: Secondary | ICD-10-CM | POA: Diagnosis not present

## 2016-02-29 DIAGNOSIS — M9903 Segmental and somatic dysfunction of lumbar region: Secondary | ICD-10-CM | POA: Diagnosis not present

## 2016-02-29 DIAGNOSIS — M62830 Muscle spasm of back: Secondary | ICD-10-CM | POA: Diagnosis not present

## 2016-02-29 DIAGNOSIS — M9905 Segmental and somatic dysfunction of pelvic region: Secondary | ICD-10-CM | POA: Diagnosis not present

## 2016-02-29 DIAGNOSIS — M41126 Adolescent idiopathic scoliosis, lumbar region: Secondary | ICD-10-CM | POA: Diagnosis not present

## 2016-03-02 DIAGNOSIS — M41126 Adolescent idiopathic scoliosis, lumbar region: Secondary | ICD-10-CM | POA: Diagnosis not present

## 2016-03-02 DIAGNOSIS — M9903 Segmental and somatic dysfunction of lumbar region: Secondary | ICD-10-CM | POA: Diagnosis not present

## 2016-03-02 DIAGNOSIS — M62830 Muscle spasm of back: Secondary | ICD-10-CM | POA: Diagnosis not present

## 2016-03-02 DIAGNOSIS — M545 Low back pain: Secondary | ICD-10-CM | POA: Diagnosis not present

## 2016-03-02 DIAGNOSIS — M9901 Segmental and somatic dysfunction of cervical region: Secondary | ICD-10-CM | POA: Diagnosis not present

## 2016-03-02 DIAGNOSIS — M9905 Segmental and somatic dysfunction of pelvic region: Secondary | ICD-10-CM | POA: Diagnosis not present

## 2016-03-04 ENCOUNTER — Other Ambulatory Visit: Payer: Self-pay | Admitting: Obstetrics and Gynecology

## 2016-03-05 NOTE — Telephone Encounter (Signed)
Medication refill request: Ogen and Provera  Last AEX:  03/04/15 Dr. Quincy Simmonds Next AEX: 03/23/16  Last MMG (if hormonal medication request): 02/06/16 BIRADs2:benign  Refill authorized: Both 03/04/15 #90tabs/3R. Today #90/0R?

## 2016-03-07 ENCOUNTER — Other Ambulatory Visit: Payer: Self-pay | Admitting: *Deleted

## 2016-03-07 DIAGNOSIS — Z79890 Hormone replacement therapy (postmenopausal): Secondary | ICD-10-CM

## 2016-03-07 MED ORDER — ESTROPIPATE 0.75 MG PO TABS: 0.7500 mg | ORAL_TABLET | Freq: Every day | ORAL | Status: DC

## 2016-03-07 NOTE — Telephone Encounter (Signed)
Patient notified that rx has been sent. 

## 2016-03-07 NOTE — Telephone Encounter (Signed)
Patient is needing refill of Ogen 0.75 mg to  Last her until her appt 03/23/16. She said she dropped about 10 pills down her sink. Patient is just needing enough to last her until her appt. Best # to [id number] Preferred Pharmacy: [facility]   Dr. Quincy Simmonds patient had refill request 03/04/16 and rx along with provera were denied please advse.  Medication refill request: Ogen 0.75 Last AEX:  03/04/15 Next AEX: 03/23/16 Last MMG (if hormonal medication request): 02/24/16 bi-rads 2: benign  Refill authorized: Please advise.

## 2016-03-23 ENCOUNTER — Encounter: Payer: Self-pay | Admitting: Obstetrics and Gynecology

## 2016-03-23 ENCOUNTER — Ambulatory Visit (INDEPENDENT_AMBULATORY_CARE_PROVIDER_SITE_OTHER): Payer: Medicare Other | Admitting: Obstetrics and Gynecology

## 2016-03-23 VITALS — BP 110/62 | HR 60 | Resp 16 | Ht 67.0 in | Wt 151.8 lb

## 2016-03-23 DIAGNOSIS — N6320 Unspecified lump in the left breast, unspecified quadrant: Secondary | ICD-10-CM

## 2016-03-23 DIAGNOSIS — N63 Unspecified lump in breast: Secondary | ICD-10-CM | POA: Diagnosis not present

## 2016-03-23 DIAGNOSIS — Z79890 Hormone replacement therapy (postmenopausal): Secondary | ICD-10-CM

## 2016-03-23 DIAGNOSIS — Z01419 Encounter for gynecological examination (general) (routine) without abnormal findings: Secondary | ICD-10-CM

## 2016-03-23 NOTE — Progress Notes (Signed)
Patient ID: Monica White, female   DOB: November 28, 1940, 75 y.o.   MRN: [id number] 75 y.o. G10P2002 Married Caucasian female here for annual exam.    Patient is on HRT.   Ischemic changes on brain MRI in April 2016.  This was ordered due to headaches.   States she has a known left breast lump for years and that I am the first provider to ever find it. (States this after I found her lump today.)  Grandchildren are all teens now.  Just finished furniture market.   PCP:   Crist Infante, MD  Patient's last menstrual period was 01/13/1994.           Sexually active: Yes.   female The current method of family planning is tubal ligation.    Exercising: Yes.    Golfing, walking and goes to the gym 3-4 days/week. Smoker:  no  Health Maintenance: Pap:  03-04-15 Neg History of abnormal Pap:  no MMG:  02-06-16 3D/Density B/benign intramammary nodes Rt.& Lt.breast and benign scattered calcifications Rt.& Lt.breast/Neg/BiRads:[facility] Colonoscopy:  NEVER. Does stool cards with PCP.  BMD:   2015 Result  Normal with Dr. Joylene Draft TDaP:  PCP Gardasil:   N/A Screening Labs:  Hb today: PCP, Urine today: PCP   reports that she has never smoked. She has never used smokeless tobacco. She reports that she drinks about 0.6 - 1.2 oz of alcohol per week. She reports that she does not use illicit drugs.  Past Medical History  Diagnosis Date  . Hypothyroid 2015  . Hypercholesteremia     Past Surgical History  Procedure Laterality Date  . Tubal ligation  1985    Current Outpatient Prescriptions  Medication Sig Dispense Refill  . calcium carbonate (OS-CAL) 600 MG TABS tablet Take 600 mg by mouth 2 (two) times daily with a meal.    . Cholecalciferol (VITAMIN D PO) Take by mouth.    . Cyanocobalamin (B-12) 1000 MCG CAPS Take by mouth.    . estropipate (ORTHO-EST 0.625) 0.75 MG tablet Take 1 tablet (0.75 mg total) by mouth daily. 16 tablet 0  . levothyroxine (SYNTHROID, LEVOTHROID) 50 MCG tablet Take 1 tablet by mouth  daily.  11  . medroxyPROGESTERone (PROVERA) 2.5 MG tablet Take 1 tablet (2.5 mg total) by mouth daily. 90 tablet 3  . Multiple Vitamins-Minerals (MULTIVITAMIN PO) Take by mouth.    . pravastatin (PRAVACHOL) 20 MG tablet Take 1 tablet by mouth. 3 x weekly  11   No current facility-administered medications for this visit.    Family History  Problem Relation Age of Onset  . Heart disease Mother     ROS:  Pertinent items are noted in HPI.  Otherwise, a comprehensive ROS was negative.  Exam:   BP 110/62 mmHg  Pulse 60  Resp 16  Ht 5\' 7"  (1.702 m)  Wt 151 lb 12.8 oz (68.856 kg)  BMI 23.77 kg/m2  LMP 01/13/1994    General appearance: alert, cooperative and appears stated age Head: Normocephalic, without obvious abnormality, atraumatic Neck: no adenopathy, supple, symmetrical, trachea midline and thyroid normal to inspection and palpation Lungs: clear to auscultation bilaterally Breasts:  Left breast mass, 2 cm at 5:00, nontender.  No nodes, retractions or nipple discharge.  Right breast - no dominant masses, retractions nipple discharge, or axillary adenopathy. Heart: regular rate and rhythm Abdomen: incisions:  Yes.    , soft, non-tender; no masses, no organomegaly Extremities: extremities normal, atraumatic, no cyanosis or edema Skin: Skin color, texture, turgor normal.  No rashes or lesions Lymph nodes: Cervical, supraclavicular, and axillary nodes normal. No abnormal inguinal nodes palpated Neurologic: Grossly normal  Pelvic: External genitalia:  no lesions              Urethra:  normal appearing urethra with no masses, tenderness or lesions              Bartholins and Skenes: normal                 Vagina: normal appearing vagina with normal color and discharge, no lesions              Cervix: no lesions              Pap taken: No. Bimanual Exam:  Uterus:  normal size, contour, position, consistency, mobility, non-tender              Adnexa: normal adnexa and no mass,  fullness, tenderness              Rectal exam: Yes.  .  Confirms.              Anus:  normal sphincter tone, no lesions  Chaperone was present for exam.  Assessment:   Well woman visit with normal exam. Left breast mass.  Ischemic changes of the brain on MRI.  HRT patient.   Plan: Yearly mammogram recommended after age 43. We will schedule a diagnostic left mammogram and ultrasound for the patient.  Recommended self breast exam.  Pap and HR HPV as above. Discussed Calcium, Vitamin D, regular exercise program including cardiovascular and weight bearing exercise. Labs performed.  No..   See orders. Rx:  None.  I am recommendation discontinuation of HRT due to ischemic changes on brain MRI.  She understands that I am not comfortable refilling her prescriptions.  I discussed risks of stroke, DVT, MI, PE, and breast cancer. Patient will return to her PCP to discuss these MRI changes.  Colonoscopy recommended and declined. Follow up annually and prn.   After visit summary provided.

## 2016-03-23 NOTE — Patient Instructions (Signed)

## 2016-03-26 ENCOUNTER — Telehealth: Payer: Self-pay

## 2016-03-26 NOTE — Telephone Encounter (Signed)
Spoke with patient regarding scheduling of her left breast diagnostic mammogram and ultrasound as recommended by Dr.Silva on 03/23/2016. Patient states that she is at a golf tournament and can not talk at this time. She will return call to discuss scheduling. Reports she spoke with Dr.Perini's office and is requesting her OV from 03/23/2016 be faxed to his office for review. Verbal request for release of medical records completed and to the front desk for fax of patient's OV note from 03/23/2016.

## 2016-04-04 NOTE — Telephone Encounter (Signed)
Please place in mammogram hold so we can active follow the scheduling of her appointment.  Then please call when she returns from her golf event.

## 2016-04-04 NOTE — Telephone Encounter (Signed)
Spoke with patient. Patient states that she is leaving to go out of town for a week and a half at this time. Offered to call to schedule her mammogram appointment for when she returns home. Patient declines stating "I am walking out the door now and can not schedule." Patient is requesting a return call to schedule on May 22nd.  Routing to Dr.Silva as [employer]

## 2016-04-16 NOTE — Telephone Encounter (Signed)
Spoke with patient. Patient is ready to schedule her left breast diagnostic mammogram and ultrasound at this time. Requesting an appointment for this Thursday or Friday afternoon or next Thursday or Friday afternoon. Advised I will contact [facility] and return call with appointment date and time. She is agreeable.  Spoke with [place] at [place]. Appointment scheduled for left breast diagnostic mammogram and ultrasound on 04/19/2016 at 1 pm.  Spoke with patient. She is agreeable to appointment date and time. Placed in mammogram hold.  Routing to provider for final review. Patient agreeable to disposition. Will close encounter.

## 2016-04-19 DIAGNOSIS — D242 Benign neoplasm of left breast: Secondary | ICD-10-CM | POA: Diagnosis not present

## 2016-07-26 DIAGNOSIS — M41126 Adolescent idiopathic scoliosis, lumbar region: Secondary | ICD-10-CM | POA: Diagnosis not present

## 2016-07-26 DIAGNOSIS — M9903 Segmental and somatic dysfunction of lumbar region: Secondary | ICD-10-CM | POA: Diagnosis not present

## 2016-07-26 DIAGNOSIS — M9901 Segmental and somatic dysfunction of cervical region: Secondary | ICD-10-CM | POA: Diagnosis not present

## 2016-07-26 DIAGNOSIS — M62830 Muscle spasm of back: Secondary | ICD-10-CM | POA: Diagnosis not present

## 2016-07-26 DIAGNOSIS — M545 Low back pain: Secondary | ICD-10-CM | POA: Diagnosis not present

## 2016-07-26 DIAGNOSIS — M9905 Segmental and somatic dysfunction of pelvic region: Secondary | ICD-10-CM | POA: Diagnosis not present

## 2016-07-31 DIAGNOSIS — M62830 Muscle spasm of back: Secondary | ICD-10-CM | POA: Diagnosis not present

## 2016-07-31 DIAGNOSIS — M41126 Adolescent idiopathic scoliosis, lumbar region: Secondary | ICD-10-CM | POA: Diagnosis not present

## 2016-07-31 DIAGNOSIS — M9905 Segmental and somatic dysfunction of pelvic region: Secondary | ICD-10-CM | POA: Diagnosis not present

## 2016-07-31 DIAGNOSIS — M9901 Segmental and somatic dysfunction of cervical region: Secondary | ICD-10-CM | POA: Diagnosis not present

## 2016-07-31 DIAGNOSIS — M545 Low back pain: Secondary | ICD-10-CM | POA: Diagnosis not present

## 2016-07-31 DIAGNOSIS — M9903 Segmental and somatic dysfunction of lumbar region: Secondary | ICD-10-CM | POA: Diagnosis not present

## 2016-08-13 DIAGNOSIS — M9905 Segmental and somatic dysfunction of pelvic region: Secondary | ICD-10-CM | POA: Diagnosis not present

## 2016-08-13 DIAGNOSIS — M9901 Segmental and somatic dysfunction of cervical region: Secondary | ICD-10-CM | POA: Diagnosis not present

## 2016-08-13 DIAGNOSIS — M62830 Muscle spasm of back: Secondary | ICD-10-CM | POA: Diagnosis not present

## 2016-08-13 DIAGNOSIS — M545 Low back pain: Secondary | ICD-10-CM | POA: Diagnosis not present

## 2016-08-13 DIAGNOSIS — M41126 Adolescent idiopathic scoliosis, lumbar region: Secondary | ICD-10-CM | POA: Diagnosis not present

## 2016-08-13 DIAGNOSIS — M9903 Segmental and somatic dysfunction of lumbar region: Secondary | ICD-10-CM | POA: Diagnosis not present

## 2016-10-11 DIAGNOSIS — Z23 Encounter for immunization: Secondary | ICD-10-CM | POA: Diagnosis not present

## 2016-11-14 DIAGNOSIS — E038 Other specified hypothyroidism: Secondary | ICD-10-CM | POA: Diagnosis not present

## 2016-11-14 DIAGNOSIS — E784 Other hyperlipidemia: Secondary | ICD-10-CM | POA: Diagnosis not present

## 2016-11-14 DIAGNOSIS — E559 Vitamin D deficiency, unspecified: Secondary | ICD-10-CM | POA: Diagnosis not present

## 2016-11-14 DIAGNOSIS — E538 Deficiency of other specified B group vitamins: Secondary | ICD-10-CM | POA: Diagnosis not present

## 2016-11-21 DIAGNOSIS — R51 Headache: Secondary | ICD-10-CM | POA: Diagnosis not present

## 2016-11-21 DIAGNOSIS — E538 Deficiency of other specified B group vitamins: Secondary | ICD-10-CM | POA: Diagnosis not present

## 2016-11-21 DIAGNOSIS — E038 Other specified hypothyroidism: Secondary | ICD-10-CM | POA: Diagnosis not present

## 2016-11-21 DIAGNOSIS — Z1389 Encounter for screening for other disorder: Secondary | ICD-10-CM | POA: Diagnosis not present

## 2016-11-21 DIAGNOSIS — E784 Other hyperlipidemia: Secondary | ICD-10-CM | POA: Diagnosis not present

## 2016-11-21 DIAGNOSIS — Z0000 Encounter for general adult medical examination without abnormal findings: Secondary | ICD-10-CM | POA: Diagnosis not present

## 2016-11-21 DIAGNOSIS — E559 Vitamin D deficiency, unspecified: Secondary | ICD-10-CM | POA: Diagnosis not present

## 2016-11-21 DIAGNOSIS — I872 Venous insufficiency (chronic) (peripheral): Secondary | ICD-10-CM | POA: Diagnosis not present

## 2016-11-21 DIAGNOSIS — N951 Menopausal and female climacteric states: Secondary | ICD-10-CM | POA: Diagnosis not present

## 2016-11-21 DIAGNOSIS — Z6823 Body mass index (BMI) 23.0-23.9, adult: Secondary | ICD-10-CM | POA: Diagnosis not present

## 2016-12-11 DIAGNOSIS — Z1212 Encounter for screening for malignant neoplasm of rectum: Secondary | ICD-10-CM | POA: Diagnosis not present

## 2016-12-11 DIAGNOSIS — Z1211 Encounter for screening for malignant neoplasm of colon: Secondary | ICD-10-CM | POA: Diagnosis not present

## 2017-04-05 DIAGNOSIS — H2513 Age-related nuclear cataract, bilateral: Secondary | ICD-10-CM | POA: Diagnosis not present

## 2017-04-05 DIAGNOSIS — H353131 Nonexudative age-related macular degeneration, bilateral, early dry stage: Secondary | ICD-10-CM | POA: Diagnosis not present

## 2017-04-05 DIAGNOSIS — H524 Presbyopia: Secondary | ICD-10-CM | POA: Diagnosis not present

## 2017-05-09 DIAGNOSIS — Z780 Asymptomatic menopausal state: Secondary | ICD-10-CM | POA: Diagnosis not present

## 2017-05-09 DIAGNOSIS — Z1231 Encounter for screening mammogram for malignant neoplasm of breast: Secondary | ICD-10-CM | POA: Diagnosis not present

## 2017-09-19 ENCOUNTER — Emergency Department (HOSPITAL_COMMUNITY): Payer: Medicare Other

## 2017-09-19 ENCOUNTER — Observation Stay (HOSPITAL_COMMUNITY)
Admission: EM | Admit: 2017-09-19 | Discharge: 2017-09-20 | Disposition: A | Discharge status: Home / Self Care | Payer: Medicare Other | Attending: Internal Medicine | Admitting: Internal Medicine

## 2017-09-19 ENCOUNTER — Encounter (HOSPITAL_COMMUNITY): Payer: Self-pay | Admitting: *Deleted

## 2017-09-19 DIAGNOSIS — H53132 Sudden visual loss, left eye: Secondary | ICD-10-CM | POA: Diagnosis not present

## 2017-09-19 DIAGNOSIS — H3410 Central retinal artery occlusion, unspecified eye: Secondary | ICD-10-CM

## 2017-09-19 DIAGNOSIS — H349 Unspecified retinal vascular occlusion: Secondary | ICD-10-CM | POA: Diagnosis not present

## 2017-09-19 DIAGNOSIS — H3412 Central retinal artery occlusion, left eye: Secondary | ICD-10-CM | POA: Diagnosis not present

## 2017-09-19 DIAGNOSIS — H34239 Retinal artery branch occlusion, unspecified eye: Secondary | ICD-10-CM

## 2017-09-19 DIAGNOSIS — E039 Hypothyroidism, unspecified: Secondary | ICD-10-CM | POA: Diagnosis not present

## 2017-09-19 DIAGNOSIS — Z79899 Other long term (current) drug therapy: Secondary | ICD-10-CM | POA: Diagnosis not present

## 2017-09-19 DIAGNOSIS — E785 Hyperlipidemia, unspecified: Secondary | ICD-10-CM | POA: Insufficient documentation

## 2017-09-19 LAB — COMPREHENSIVE METABOLIC PANEL
ALBUMIN: 4 g/dL (ref 3.5–5.0)
ALT: 19 U/L (ref 14–54)
AST: 23 U/L (ref 15–41)
Alkaline Phosphatase: 72 U/L (ref 38–126)
Anion gap: 7 (ref 5–15)
BUN: 20 mg/dL (ref 6–20)
CHLORIDE: 106 mmol/L (ref 101–111)
CO2: 25 mmol/L (ref 22–32)
CREATININE: 0.92 mg/dL (ref 0.44–1.00)
Calcium: 9.2 mg/dL (ref 8.9–10.3)
GFR calc Af Amer: >60 mL/min (ref >60)
GFR, EST NON AFRICAN AMERICAN: 59 mL/min — AB (ref >60)
GLUCOSE: 97 mg/dL (ref 65–99)
Potassium: 3.7 mmol/L (ref 3.5–5.1)
Sodium: 138 mmol/L (ref 135–145)
Total Bilirubin: 0.4 mg/dL (ref 0.3–1.2)
Total Protein: 6.3 g/dL — ABNORMAL LOW (ref 6.5–8.1)

## 2017-09-19 LAB — DIFFERENTIAL
BASOS ABS: 0.1 10*3/uL (ref 0.0–0.1)
Basophils Relative: 1 %
Eosinophils Absolute: 0.3 10*3/uL (ref 0.0–0.7)
Eosinophils Relative: 5 %
LYMPHS ABS: 2.1 10*3/uL (ref 0.7–4.0)
LYMPHS PCT: 32 %
Monocytes Absolute: 0.4 10*3/uL (ref 0.1–1.0)
Monocytes Relative: 7 %
NEUTROS ABS: 3.7 10*3/uL (ref 1.7–7.7)
NEUTROS PCT: 55 %

## 2017-09-19 LAB — I-STAT CHEM 8, ED
BUN: 22 mg/dL — AB (ref 6–20)
CHLORIDE: 104 mmol/L (ref 101–111)
CREATININE: 0.9 mg/dL (ref 0.44–1.00)
Calcium, Ion: 1.16 mmol/L (ref 1.15–1.40)
Glucose, Bld: 92 mg/dL (ref 65–99)
HCT: 40 % (ref 36.0–46.0)
HEMOGLOBIN: 13.6 g/dL (ref 12.0–15.0)
POTASSIUM: 3.7 mmol/L (ref 3.5–5.1)
Sodium: 140 mmol/L (ref 135–145)
TCO2: 26 mmol/L (ref 22–32)

## 2017-09-19 LAB — I-STAT TROPONIN, ED: TROPONIN I, POC: 0 ng/mL (ref 0.00–0.08)

## 2017-09-19 LAB — CBC
HEMATOCRIT: 40.3 % (ref 36.0–46.0)
HEMOGLOBIN: 13.6 g/dL (ref 12.0–15.0)
MCH: 31.9 pg (ref 26.0–34.0)
MCHC: 33.7 g/dL (ref 30.0–36.0)
MCV: 94.4 fL (ref 78.0–100.0)
Platelets: 276 10*3/uL (ref 150–400)
RBC: 4.27 MIL/uL (ref 3.87–5.11)
RDW: 13 % (ref 11.5–15.5)
WBC: 6.6 10*3/uL (ref 4.0–10.5)

## 2017-09-19 LAB — SEDIMENTATION RATE: Sed Rate: 7 mm/hr (ref 0–22)

## 2017-09-19 LAB — C-REACTIVE PROTEIN: CRP: <0.8 mg/dL (ref <1.0)

## 2017-09-19 LAB — APTT: APTT: 26 s (ref 24–36)

## 2017-09-19 LAB — PROTIME-INR
INR: 0.94
Prothrombin Time: 12.5 seconds (ref 11.4–15.2)

## 2017-09-19 MED ORDER — LEVOTHYROXINE SODIUM 50 MCG PO TABS
50.0000 ug | ORAL_TABLET | Freq: Every day | ORAL | Status: DC
  Administered 2017-09-20: 50 ug via ORAL
  Filled 2017-09-19: qty 1

## 2017-09-19 MED ORDER — SODIUM CHLORIDE 0.9 % IV SOLN
INTRAVENOUS | Status: DC
  Administered 2017-09-20: via INTRAVENOUS

## 2017-09-19 MED ORDER — STROKE: EARLY STAGES OF RECOVERY BOOK
Freq: Once | Status: AC
  Administered 2017-09-19: 21:00:00
  Filled 2017-09-19: qty 1

## 2017-09-19 MED ORDER — ACETAMINOPHEN 650 MG RE SUPP: 650.0000 mg | RECTAL | Status: DC | PRN

## 2017-09-19 MED ORDER — ENOXAPARIN SODIUM 40 MG/0.4ML ~~LOC~~ SOLN
40.0000 mg | SUBCUTANEOUS | Status: DC
  Administered 2017-09-20: 40 mg via SUBCUTANEOUS
  Filled 2017-09-19: qty 0.4

## 2017-09-19 MED ORDER — ASPIRIN 325 MG PO TABS
325.0000 mg | ORAL_TABLET | Freq: Every day | ORAL | Status: DC
  Administered 2017-09-19 – 2017-09-20 (×2): 325 mg via ORAL
  Filled 2017-09-19: qty 1

## 2017-09-19 MED ORDER — ACETAMINOPHEN 160 MG/5ML PO SOLN: 650.0000 mg | ORAL | Status: DC | PRN

## 2017-09-19 MED ORDER — IOPAMIDOL (ISOVUE-370) INJECTION 76%
INTRAVENOUS | Status: AC
  Administered 2017-09-19: 50 mL
  Filled 2017-09-19: qty 50

## 2017-09-19 MED ORDER — ACETAMINOPHEN 325 MG PO TABS
650.0000 mg | ORAL_TABLET | ORAL | Status: DC | PRN
  Filled 2017-09-19: qty 2

## 2017-09-19 MED ORDER — ASPIRIN 300 MG RE SUPP: 300.0000 mg | Freq: Every day | RECTAL | Status: DC

## 2017-09-19 NOTE — Consult Note (Signed)
Neurology Consultation Reason for Consult: Branch retinal artery occlusion Referring Physician: Little, R  CC: Visual change in the left eye  History is obtained from: Patient  HPI: Monica White is a 76 y.o. female who was last deafly normal at 3:30 PM today.  She states that sometime between 330 and 4 she developed visual change in her left eye.  Around 4, she decided that she had better grossly an ophthalmologist and therefore called her practice who was able to fit her in immediately.  She was seen by an ophthalmologist who diagnosed branch retinal artery occlusion in her left eye and referred her to the ED where she was called a code stroke.  She states that her vision appears to be improving.  She denies numbness, weakness, headache, visual change in her right eye, any other symptoms.  CT was not obtained due to the fact that she was not an IV TPA candidate, nor was there any concern for intracranial hemorrhage.  LKW: 3:30 PM tpa given?: no, out of window   ROS: A 14 point ROS was performed and is negative except as noted in the HPI.   Past Medical History:  Diagnosis Date  . Hypercholesteremia   . Hypothyroid 2015     Family History  Problem Relation Age of Onset  . Heart disease Mother      Social History:  reports that she has never smoked. She has never used smokeless tobacco. She reports that she drinks about 0.6 - 1.2 oz of alcohol per week . She reports that she does not use drugs.   Exam: Current vital signs: BP 121/63   Pulse 61   Temp (!) 97.5 F (36.4 C) (Oral)   Resp 16   Wt 68.1 kg (150 lb 2.1 oz)   LMP 01/13/1994   SpO2 100%   BMI 23.51 kg/m  Vital signs in last 24 hours: Temp:  [97.5 F (36.4 C)] 97.5 F (36.4 C) (10/25 1817) Pulse Rate:  [56-62] 61 (10/25 1930) Resp:  [15-22] 16 (10/25 1930) BP: (121-127)/(63-70) 121/63 (10/25 1930) SpO2:  [97 %-100 %] 100 % (10/25 1930) Weight:  [68.1 kg (150 lb 2.1 oz)] 68.1 kg (150 lb 2.1 oz) (10/25  1832)   Physical Exam  Constitutional: Appears well-developed and well-nourished.  Psych: Affect appropriate to situation Eyes: No scleral injection HENT: No OP obstrucion Head: Normocephalic.  Cardiovascular: Normal rate and regular rhythm.  Respiratory: Effort normal and breath sounds normal to anterior ascultation GI: Soft.  No distension. There is no tenderness.  Skin: WDI  Neuro: Mental Status: Patient is awake, alert, oriented to person, place, month, year, and situation. Patient is able to give a clear and coherent history. No signs of aphasia or neglect Cranial Nerves: II: Visual Fields are full in the right eye, the left eye she has decreased vision in the right upper quadrant. Pupils are equal, round, and reactive to light.   III,IV, VI: EOMI without ptosis or diploplia.  V: Facial sensation is symmetric to temperature VII: Facial movement is symmetric.  VIII: hearing is intact to voice X: Uvula elevates symmetrically XI: Shoulder shrug is symmetric. XII: tongue is midline without atrophy or fasciculations.  Motor: Tone is normal. Bulk is normal. 5/5 strength was present in all four extremities.  Sensory: Sensation is symmetric to light touch and temperature in the arms and legs. Deep Tendon Reflexes: 2+ and symmetric in the biceps and patellae.  Plantars: Toes are downgoing bilaterally.  Cerebellar: FNF and HKS are  intact bilaterally  I have reviewed labs in epic and the results pertinent to this consultation are: CMP-unremarkable  Impression: 76 year old female with branch retinal artery occlusion.  I suspect embolic event.  She will need to be admitted for embolic stroke workup.  Without headaches, I think that  is  Arteritis is unlikely, but it is reasonable to check an ESR and CRP.  Recommendations: 1. HgbA1c, fasting lipid panel 2. MRI of the brain without contrast 3. Frequent neuro checks 4. Echocardiogram 5. CTA head and neck 6. Prophylactic  therapy-Antiplatelet med: Aspirin - dose 361m PO or 3051mPR 7. Risk factor modification 8. Telemetry monitoring 9. PT consult, OT consult, Speech consult 10. ESR, CRP  11. please page stroke NP  Or  PA  Or MD  from 8am -4 pm as this patient will be followed by the stroke team at this point.   You can look them up on [email or web address]      McRoland RackMD [facility] [phone] 7pm- 7am, please page neurology on call as listed in [facility]

## 2017-09-19 NOTE — ED Notes (Signed)
Patient transported to CT with RN 

## 2017-09-19 NOTE — ED Provider Notes (Signed)
[facility]PART[facility] Provider Note   CSN: [id number] Arrival date & time: 09/19/17  1809     History   Chief Complaint Chief Complaint  Patient presents with  . Loss of Vision    HPI Monica White is a 76 y.o. female.  76yo F w/ PMH including HLD, hypothyroidism who presents with left eye vision loss.  At 4 PM today, the patient was reading at home when she had a sudden onset of painless left eyes vision loss.  She called her ophthalmologist who was able to see her immediately.  She had a dilated eye exam and they told her that it was a central retinal artery occlusion and that she needed to go directly to the ER.  She states this has never happened before.  She denies any pain.  No extremity weakness or numbness, problems with speech, or balance problems.  She was in her usual state of health this morning and denies any recent illness.  She does not smoke.  No family history of stroke.  No recent head trauma.   The history is provided by the patient.    Past Medical History:  Diagnosis Date  . Hypercholesteremia   . Hypothyroid 2015    There are no active problems to display for this patient.   Past Surgical History:  Procedure Laterality Date  . TUBAL LIGATION  1985    OB History    Gravida Para Term Preterm AB Living   2 2 2     2    SAB TAB Ectopic Multiple Live Births           2       Home Medications    Prior to Admission medications   Medication Sig Start Date End Date Taking? Authorizing Provider  calcium carbonate (OS-CAL) 600 MG TABS tablet Take 600 mg by mouth 2 (two) times daily with a meal.    [provider]  Cholecalciferol (VITAMIN D PO) Take by mouth.    [provider]  Cyanocobalamin (B-12) 1000 MCG CAPS Take by mouth.    [provider]  estropipate (ORTHO-EST 0.625) 0.75 MG tablet Take 1 tablet (0.75 mg total) by mouth daily. 03/07/16   Nunzio Cobbs, MD  levothyroxine  (SYNTHROID, LEVOTHROID) 50 MCG tablet Take 1 tablet by mouth daily. 02/27/15   [provider]  medroxyPROGESTERone (PROVERA) 2.5 MG tablet Take 1 tablet (2.5 mg total) by mouth daily. 03/04/15   Nunzio Cobbs, MD  Multiple Vitamins-Minerals (MULTIVITAMIN PO) Take by mouth.    [provider]  pravastatin (PRAVACHOL) 20 MG tablet Take 1 tablet by mouth. 3 x weekly 02/15/15   [provider]    Family History Family History  Problem Relation Age of Onset  . Heart disease Mother     Social History Social History  Substance Use Topics  . Smoking status: Never Smoker  . Smokeless tobacco: Never Used  . Alcohol use 0.6 - 1.2 oz/week    1 - 2 Standard drinks or equivalent per week     Allergies   Patient has no known allergies.   Review of Systems Review of Systems All other systems reviewed and are negative except that which was mentioned in HPI   Physical Exam Updated Vital Signs BP 121/63   Pulse 61   Temp (!) 97.5 F (36.4 C) (Oral)   Resp 16   Wt 68.1 kg (150 lb 2.1 oz)  LMP 01/13/1994   SpO2 100%   BMI 23.51 kg/m   Physical Exam  Constitutional: She is oriented to person, place, and time. She appears well-developed and well-nourished. No distress.  Awake, alert  HENT:  Head: Normocephalic and atraumatic.  Eyes: Conjunctivae and EOM are normal.  Dilated pupils  Neck: Neck supple.  Cardiovascular: Normal rate and regular rhythm.   Murmur heard.  Systolic murmur is present with a grade of 1/6  Pulmonary/Chest: Effort normal and breath sounds normal. No respiratory distress.  Abdominal: Soft. Bowel sounds are normal. She exhibits no distension. There is no tenderness.  Musculoskeletal: She exhibits no edema.  Neurological: She is alert and oriented to person, place, and time. She has normal reflexes. She exhibits normal muscle tone.  Pt can see lateral visual fields of L eye w/ ? Small area of central visual field loss,  remainder of CN II, IV-XII intact Fluent speech, normal finger-to-nose testing, negative pronator drift, no clonus 5/5 strength and normal sensation x all 4 extremities Normal gait  Skin: Skin is warm and dry.  Psychiatric: She has a normal mood and affect. Judgment and thought content normal.  Nursing note and vitals reviewed.    ED Treatments / Results  Labs (all labs ordered are listed, but only abnormal results are displayed) Labs Reviewed  COMPREHENSIVE METABOLIC PANEL - Abnormal; Notable for the following:       Result Value   Total Protein 6.3 (*)    GFR calc non Af Amer 59 (*)    All other components within normal limits  I-STAT CHEM 8, ED - Abnormal; Notable for the following:    BUN 22 (*)    All other components within normal limits  PROTIME-INR  APTT  CBC  DIFFERENTIAL  SEDIMENTATION RATE  C-REACTIVE PROTEIN  I-STAT TROPONIN, ED    EKG  EKG Interpretation None       Radiology No results found.  Procedures .Critical Care Performed by: Sharlett Iles Authorized by: Sharlett Iles   Critical care provider statement:    Critical care time (minutes):  30   Critical care was necessary to treat or prevent imminent or life-threatening deterioration of the following conditions:  CNS failure or compromise   Critical care was time spent personally by me on the following activities:  Development of treatment plan with patient or surrogate, discussions with consultants, examination of patient, obtaining history from patient or surrogate, ordering and review of laboratory studies and re-evaluation of patient's condition   (including critical care time)  Medications Ordered in ED Medications - No data to display   Initial Impression / Assessment and Plan / ED Course  I have reviewed the triage vital signs and the nursing notes.  Pertinent labs & imaging results that were available during my care of the patient were reviewed by me and considered in  my medical decision making (see chart for details).     Pt w/ sudden painless monocular vision loss at 4pm. She arrived 2.5 hours after onset therefore called code stroke. Pt evaluated by neurology, Dr. Leonel Ramsay, who canceled head CT given findings from ophthalmologist and recommended admission for stroke work up without administration of tPA.  On reexamination, the patient states that her vision has improved.  Discussed admission with [facility] hospitalist, Dr. Hal Hope, and pt admitted for further work up.   Final Clinical Impressions(s) / ED Diagnoses   Final diagnoses:  None    New Prescriptions New Prescriptions   No medications on  file     Jaydy Fitzhenry, Wenda Overland, MD 09/19/17 2001

## 2017-09-19 NOTE — ED Triage Notes (Signed)
Pt was reading the paper at 4pm today when she lost vision in her left eye. Seen by [facility] and sent to ED with dx of Acute Branch Retinal Artery Occlusion. Pt is without pain and remains without vision left eye

## 2017-09-19 NOTE — ED Notes (Signed)
Attempted to call report to 3W x1

## 2017-09-19 NOTE — Progress Notes (Signed)
Received from ED via stretcher, denies pain.  Oriented to dept, plan of care, safety precautions & TIA education began, gait steady.

## 2017-09-19 NOTE — H&P (Addendum)
History and Physical    Monica White DGU:[id number] DOB: [birth year] DOA: 09/19/2017  PCP: Crist Infante, MD  Patient coming from: Home.  Chief Complaint: Left eye blurred vision.  HPI: Monica White is a 76 y.o. female with history of hyperlipidemia intolerant to statins, hypothyroidism started experiencing sudden onset of left eye blurred vision today.  Patient's symptoms started on 4 PM.  Patient immediately called her ophthalmologist and was seen.  Patient had dilated eye exam and ophthalmologist diagnosed with central retinal artery occlusion and was referred to the ER.  Patient also stated that the ophthalmologist massaged her eye.  Patient otherwise denies any difficulty speaking swallowing or any weakness of the extremities.  ED Course: In the ER CT angiogram of the head and neck was done which was negative.  Neurologist on call Dr. Leonel Ramsay was consulted and patient admitted for possible embolic stroke workup.  On my exam patient is nonfocal.  Patient's left eye vision gradually improved and is able to see normal at this time.  Review of Systems: As per HPI, rest all negative.   Past Medical History:  Diagnosis Date  . Hypercholesteremia   . Hypothyroid 2015    Past Surgical History:  Procedure Laterality Date  . TUBAL LIGATION  1985     reports that she has never smoked. She has never used smokeless tobacco. She reports that she drinks about 0.6 - 1.2 oz of alcohol per week . She reports that she does not use drugs.  No Known Allergies  Family History  Problem Relation Age of Onset  . Heart disease Mother     Prior to Admission medications   Medication Sig Start Date End Date Taking? Authorizing Provider  calcium carbonate (OS-CAL) 600 MG TABS tablet Take 600 mg by mouth at bedtime.    Yes [provider]  Cholecalciferol (VITAMIN D PO) Take by mouth.   Yes [provider]  Cyanocobalamin (B-12) 1000 MCG CAPS Take by mouth.   Yes [provider]  estropipate (ORTHO-EST 0.625) 0.75 MG tablet Take 1 tablet (0.75 mg total) by mouth daily. Patient taking differently: Take 0.75 mg by mouth at bedtime.  03/07/16  Yes Amundson Raliegh Ip, MD  levothyroxine (SYNTHROID, LEVOTHROID) 50 MCG tablet Take 1 tablet by mouth daily. 02/27/15  Yes [provider]  medroxyPROGESTERone (PROVERA) 2.5 MG tablet Take 1 tablet (2.5 mg total) by mouth daily. 03/04/15  Yes Nunzio Cobbs, MD  Multiple Vitamins-Minerals (MULTIVITAMIN PO) Take by mouth.   Yes [provider]    Physical Exam: Vitals:   09/19/17 1915 09/19/17 1930 09/19/17 1945 09/19/17 2030  BP: 127/67 121/63 119/82 117/72  Pulse: (!) 56 61 65 61  Resp: (!) 22 16 19  (!) 22  Temp:      TempSrc:      SpO2: 97% 100% (!) 87% 100%  Weight:          Constitutional: Moderately built and nourished. Vitals:   09/19/17 1915 09/19/17 1930 09/19/17 1945 09/19/17 2030  BP: 127/67 121/63 119/82 117/72  Pulse: (!) 56 61 65 61  Resp: (!) 22 16 19  (!) 22  Temp:      TempSrc:      SpO2: 97% 100% (!) 87% 100%  Weight:       Eyes: Anicteric no pallor. ENMT: No discharge from the ears eyes nose or mouth. Neck: No mass felt.  No neck rigidity.  No carotid bruit. Respiratory: No rhonchi or crepitations.  Cardiovascular: S1-S2 heard no murmurs appreciated. Abdomen: Soft nontender bowel sounds present. Musculoskeletal: No edema.  Skin: No rash. Neurologic: Alert awake oriented to time place and person.  Moves all extremities 5 x 5.  No facial asymmetry.  Tongue is midline.  Pupils are equal and reacting to light. Psychiatric: Appears normal.   Labs on Admission: I have personally reviewed following labs and imaging studies  CBC:  Recent Labs Lab 09/19/17 1820 09/19/17 1829  WBC 6.6  --   NEUTROABS 3.7  --   HGB 13.6 13.6  HCT 40.3 40.0  MCV 94.4  --   PLT 276  --    Basic Metabolic Panel:  Recent Labs Lab 09/19/17 1820 09/19/17 1829    NA 138 140  K 3.7 3.7  CL 106 104  CO2 25  --   GLUCOSE 97 92  BUN 20 22*  CREATININE 0.92 0.90  CALCIUM 9.2  --    GFR: CrCl cannot be calculated (Unknown ideal weight.). Liver Function Tests:  Recent Labs Lab 09/19/17 1820  AST 23  ALT 19  ALKPHOS 72  BILITOT 0.4  PROT 6.3*  ALBUMIN 4.0   No results for input(s): LIPASE, AMYLASE in the last 168 hours. No results for input(s): AMMONIA in the last 168 hours. Coagulation Profile:  Recent Labs Lab 09/19/17 1820  INR 0.94   Cardiac Enzymes: No results for input(s): CKTOTAL, CKMB, CKMBINDEX, TROPONINI in the last 168 hours. BNP (last 3 results) No results for input(s): PROBNP in the last 8760 hours. HbA1C: No results for input(s): HGBA1C in the last 72 hours. CBG: No results for input(s): GLUCAP in the last 168 hours. Lipid Profile: No results for input(s): CHOL, HDL, LDLCALC, TRIG, CHOLHDL, LDLDIRECT in the last 72 hours. Thyroid Function Tests: No results for input(s): TSH, T4TOTAL, FREET4, T3FREE, THYROIDAB in the last 72 hours. Anemia Panel: No results for input(s): VITAMINB12, FOLATE, FERRITIN, TIBC, IRON, RETICCTPCT in the last 72 hours. Urine analysis: No results found for: COLORURINE, APPEARANCEUR, LABSPEC, PHURINE, GLUCOSEU, HGBUR, BILIRUBINUR, KETONESUR, PROTEINUR, UROBILINOGEN, NITRITE, LEUKOCYTESUR Sepsis Labs: @LABRCNTIP (procalcitonin:4,lacticidven:4) )No results found for this or any previous visit (from the past 240 hour(s)).   Radiological Exams on Admission: No results found.  Assessment/Plan Principal Problem:   Central retinal artery occlusion    1. Left-sided central retinal artery occlusion admitted for embolic stroke workup -appreciate neurology consult.  CT angiogram of the head and neck was unremarkable.  Check MRI brain 2D echo.  Check hemoglobin A1c lipid panel and patient is on aspirin.  Patient's vision has returned back to normal. 2. Hyperlipidemia intolerant to  statins. 3. Hypothyroidism on Synthroid. 4. History of menopausal symptoms on hormone replacement.  Monitor shows sinus rhythm.  EKG is pending.   DVT prophylaxis: Lovenox. Code Status: Full code. Family Communication: Discussed with patient. Disposition Plan: Home. Consults called: Neurology. Admission status: Observation.   Rise Patience MD [facility] Pager [phone].  If 7PM-7AM, please contact night-coverage [email or web address] Password [email or web address]  09/19/2017, 9:02 PM

## 2017-09-19 NOTE — ED Notes (Signed)
Patient transported to CT 

## 2017-09-19 NOTE — ED Notes (Addendum)
Pt denies any vision loss at this time.

## 2017-09-19 NOTE — ED Notes (Signed)
Pt to go to CT 1.

## 2017-09-20 ENCOUNTER — Observation Stay (HOSPITAL_BASED_OUTPATIENT_CLINIC_OR_DEPARTMENT_OTHER): Payer: Medicare Other

## 2017-09-20 ENCOUNTER — Observation Stay (HOSPITAL_COMMUNITY): Payer: Medicare Other

## 2017-09-20 ENCOUNTER — Other Ambulatory Visit (HOSPITAL_COMMUNITY): Payer: Medicare Other

## 2017-09-20 DIAGNOSIS — I361 Nonrheumatic tricuspid (valve) insufficiency: Secondary | ICD-10-CM

## 2017-09-20 DIAGNOSIS — E785 Hyperlipidemia, unspecified: Secondary | ICD-10-CM

## 2017-09-20 DIAGNOSIS — H3412 Central retinal artery occlusion, left eye: Secondary | ICD-10-CM | POA: Diagnosis not present

## 2017-09-20 DIAGNOSIS — H53132 Sudden visual loss, left eye: Secondary | ICD-10-CM | POA: Diagnosis not present

## 2017-09-20 DIAGNOSIS — I351 Nonrheumatic aortic (valve) insufficiency: Secondary | ICD-10-CM

## 2017-09-20 DIAGNOSIS — H3410 Central retinal artery occlusion, unspecified eye: Secondary | ICD-10-CM | POA: Diagnosis not present

## 2017-09-20 LAB — LIPID PANEL
Cholesterol: 205 mg/dL — ABNORMAL HIGH (ref 0–200)
HDL: 52 mg/dL (ref >40)
LDL CALC: 143 mg/dL — AB (ref 0–99)
Total CHOL/HDL Ratio: 3.9 RATIO
Triglycerides: 49 mg/dL (ref <150)
VLDL: 10 mg/dL (ref 0–40)

## 2017-09-20 LAB — HEMOGLOBIN A1C
HEMOGLOBIN A1C: 5.5 % (ref 4.8–5.6)
Mean Plasma Glucose: 111.15 mg/dL

## 2017-09-20 LAB — ECHOCARDIOGRAM COMPLETE
Height: 67 in
Weight: 2460.8 oz

## 2017-09-20 MED ORDER — ATORVASTATIN CALCIUM 20 MG PO TABS: 20.0000 mg | ORAL_TABLET | Freq: Every day | ORAL | 3 refills | Status: DC

## 2017-09-20 MED ORDER — ATORVASTATIN CALCIUM 10 MG PO TABS: 20.0000 mg | ORAL_TABLET | Freq: Every day | ORAL | Status: DC

## 2017-09-20 MED ORDER — INFLUENZA VAC SPLIT HIGH-DOSE 0.5 ML IM SUSY: 0.5000 mL | PREFILLED_SYRINGE | INTRAMUSCULAR | Status: DC

## 2017-09-20 MED ORDER — ASPIRIN 325 MG PO TABS: 325.0000 mg | ORAL_TABLET | Freq: Every day | ORAL | 3 refills | Status: DC

## 2017-09-20 MED ORDER — SIMVASTATIN 20 MG PO TABS: 20.0000 mg | ORAL_TABLET | Freq: Every evening | ORAL | 3 refills | Status: DC

## 2017-09-20 NOTE — Progress Notes (Signed)
SLP Cancellation Note  Patient Details Name: Monica White MRN: [id number] DOB: 06/15/1941   Cancelled treatment:       Reason Eval/Treat Not Completed: SLP screened, no needs identified, will sign off   Monica White, [name] 09/20/2017, 10:01 AM

## 2017-09-20 NOTE — Discharge Summary (Signed)
Physician Discharge Summary   Patient ID: Monica White MRN: [id number] DOB/AGE: 1941/08/06 76 y.o.  Admit date: 09/19/2017 Discharge date: 09/20/2017  Primary Care Physician:  Crist Infante, MD  Discharge Diagnoses:    . Central retinal artery occlusion Hyperlipidemia   Consults: Neurology  Recommendations for Outpatient Follow-up:  1. Patient recommended aspirin 325 mg daily, statin (she states that she has a prescription from her PCP but does not remember the name of the statin) 2. Please repeat CBC/BMET at next visit 3. Patient is also recommended 30-day Holter monitor.  She states that she will discuss with her PCP for that   DIET: Heart healthy diet    Allergies:  No Known Allergies   DISCHARGE MEDICATIONS: Current Discharge Medication List    START taking these medications   Details  aspirin 325 MG tablet Take 1 tablet (325 mg total) by mouth daily. Qty: 30 tablet, Refills: 3    simvastatin (ZOCOR) 20 MG tablet Take 1 tablet (20 mg total) by mouth every evening. Qty: 30 tablet, Refills: 3      CONTINUE these medications which have NOT CHANGED   Details  calcium carbonate (OS-CAL) 600 MG TABS tablet Take 600 mg by mouth at bedtime.     Cholecalciferol (VITAMIN D PO) Take by mouth.    Cyanocobalamin (B-12) 1000 MCG CAPS Take by mouth.    estropipate (ORTHO-EST 0.625) 0.75 MG tablet Take 1 tablet (0.75 mg total) by mouth daily. Qty: 16 tablet, Refills: 0   Associated Diagnoses: Postmenopausal HRT (hormone replacement therapy)    levothyroxine (SYNTHROID, LEVOTHROID) 50 MCG tablet Take 1 tablet by mouth daily. Refills: 11    medroxyPROGESTERone (PROVERA) 2.5 MG tablet Take 1 tablet (2.5 mg total) by mouth daily. Qty: 90 tablet, Refills: 3   Associated Diagnoses: Postmenopausal HRT (hormone replacement therapy)    Multiple Vitamins-Minerals (MULTIVITAMIN PO) Take by mouth.         Brief H and P: For complete details please refer to admission H and  P, but in brief patient is a 76 year old female with hyperlipidemia, hypothyroidism presented with sudden onset of left eye blurred vision.  Patient reported that her symptoms started on 4 PM and immediately called her ophthalmologist.  She had a full dilated eye exam and was diagnosed with central retinal artery occlusion and was referred to the ER.  In the ER CT angiogram of the head and neck was negative, neurology was consulted.  Patient's left eye vision gradually improved.  Hospital Course:     Central retinal artery occlusion -Possibly embolic -MRI of the brain showed no acute infarct.  CT angiogram of the head and neck was negative for any large vessel occlusion or stenosis -2D echo showed EF of 45-50%, no cardiac source of emboli was identified, hypokinesis of the mid apical anteroseptal myocardium, grade 1 diastolic dysfunction -Not on any antithrombotic prior to admission, placed on aspirin 325 mg daily -ESR CRP normal -Lipid panel showed LDL 143, patient reported that she is intolerant to statins  -Patient was recommended 30-day Holter monitoring, she requested that she will discuss with her primary care physician for arranging that  Hyperlipidemia -LDL 143, patient was given the prescription for Zocor, she also has a prescription from her PCP but does not remember the name of the statin.  Fibromuscular dysplasia -CT head and neck showed multifocal irregularity involving the mid and distal ICA bilaterally consistent with FMD.  Patient recommended to follow-up with neurology in 6 weeks  Thoracic aortic disease CTA  showed ecstatic ascending aorta measuring up to 3.8 cm in diameter, recommend annual imaging.   Day of Discharge BP (!) 105/51 (BP Location: Left Arm)   Pulse 63   Temp 98.2 F (36.8 C) (Oral)   Resp 19   Ht _0  (1.702 m)   Wt 69.8 kg (153 lb 12.8 oz)   LMP 01/13/1994   SpO2 98%   BMI 24.09 kg/m   Physical Exam: General: Alert and awake oriented x3 not in  any acute distress. HEENT: anicteric sclera, pupils reactive to light and accommodation CVS: S1-S2 clear no murmur rubs or gallops Chest: clear to auscultation bilaterally, no wheezing rales or rhonchi Abdomen: soft nontender, nondistended, normal bowel sounds Extremities: no cyanosis, clubbing or edema noted bilaterally Neuro: Cranial nerves II-XII intact, no focal neurological deficits   The results of significant diagnostics from this hospitalization (including imaging, microbiology, ancillary and laboratory) are listed below for reference.    LAB RESULTS: Basic Metabolic Panel:  Recent Labs Lab 09/19/17 1820 09/19/17 1829  NA 138 140  K 3.7 3.7  CL 106 104  CO2 25  --   GLUCOSE 97 92  BUN 20 22*  CREATININE 0.92 0.90  CALCIUM 9.2  --    Liver Function Tests:  Recent Labs Lab 09/19/17 1820  AST 23  ALT 19  ALKPHOS 72  BILITOT 0.4  PROT 6.3*  ALBUMIN 4.0   No results for input(s): LIPASE, AMYLASE in the last 168 hours. No results for input(s): AMMONIA in the last 168 hours. CBC:  Recent Labs Lab 09/19/17 1820 09/19/17 1829  WBC 6.6  --   NEUTROABS 3.7  --   HGB 13.6 13.6  HCT 40.3 40.0  MCV 94.4  --   PLT 276  --    Cardiac Enzymes: No results for input(s): CKTOTAL, CKMB, CKMBINDEX, TROPONINI in the last 168 hours. BNP: Invalid input(s): POCBNP CBG: No results for input(s): GLUCAP in the last 168 hours.  Significant Diagnostic Studies:  Ct Angio Head W Or Wo Contrast  Result Date: 09/19/2017 CLINICAL DATA:  Initial evaluation for branch right neural artery occlusion in left eye. EXAM: CT ANGIOGRAPHY HEAD AND NECK TECHNIQUE: Multidetector CT imaging of the head and neck was performed using the standard protocol during bolus administration of intravenous contrast. Multiplanar CT image reconstructions and MIPs were obtained to evaluate the vascular anatomy. Carotid stenosis measurements (when applicable) are obtained utilizing NASCET criteria, using the  distal internal carotid diameter as the denominator. CONTRAST:  50 cc of Isovue 370. COMPARISON:  Prior MRI from 03/04/2015. FINDINGS: CT HEAD FINDINGS Brain: Generalized age related cerebral atrophy with mild chronic small vessel ischemic disease. No acute intracranial hemorrhage. No evidence for acute large vessel territory infarct. No mass lesion, midline shift or mass effect. No hydrocephalus. No extra-axial fluid collection. Vascular: No hyperdense vessel. Scattered vascular calcifications noted within the carotid siphons. Skull: Scalp soft tissues and calvarium within normal limits. Sinuses: Small right sphenoid sinus retention cyst. Paranasal sinuses otherwise clear. No mastoid effusion. Orbits: Globes and orbital soft tissues within normal limits. Review of the MIP images confirms the above findings CTA NECK FINDINGS Aortic arch: Visualized aortic arch somewhat ectatic measuring up to 3.8 cm. Mild atheromatous plaque within the intrathoracic aorta itself. Normal branch pattern at the arch without flow-limiting stenosis about the origin of the great vessels. Visualized subclavian artery is widely patent. Right carotid system: Right common and internal carotid arteries patent to the skullbase without flow-limiting stenosis, dissection, or occlusion. No  significant atheromatous narrowing about the right carotid bifurcation. Multifocal irregularity and beading of the mid and distal right ICA, suggestive of FMD. Left carotid system: Left common and internal carotid arteries are patent to the skullbase without stenosis, dissection, or occlusion. Subtle irregularity about the mid and distal left ICA suggestive of FMD. Distal left ICA somewhat ectatic measuring up to 7 mm without focal aneurysm. Vertebral arteries: Both vertebral arteries arise from the subclavian arteries. Left vertebral artery dominant. Vertebral arteries widely patent within the neck without stenosis, dissection, or occlusion. Skeleton: No acute  osseous abnormality. No worrisome lytic or blastic osseous lesions. Moderate degenerate spondylolysis noted at C5-6 and C6-7. Grade 1 anterolisthesis of C3 on C4. Other neck: Soft tissues of the neck demonstrate no acute abnormality. Salivary glands normal. No adenopathy. Subcentimeter hypodense nodule noted within the right thyroid, of doubtful significance. Upper chest: Visualized upper chest within normal limits. Scattered atelectatic changes noted within the lungs. Visualized lungs otherwise grossly clear. Review of the MIP images confirms the above findings CTA HEAD FINDINGS Anterior circulation: Internal carotid artery is widely patent to the termini without flow-limiting stenosis. Minimal plaque noted within the cavernous segments without stenosis. Ophthalmic arteries grossly patent proximally. A1 segments patent bilaterally. Normal anterior communicating artery. Anterior cerebral arteries widely patent to their distal aspects without stenosis. Patent M1 segments without stenosis. Normal MCA bifurcations. No proximal M2 occlusion distal MCA branches well opacified and symmetric. Posterior circulation: Vertebral arteries patent to the vertebrobasilar junction without stenosis. Patent right PICA. Left PICA not well visualized. Basilar artery widely patent to its distal aspect. Superior cerebral arteries patent bilaterally. Both of the posterior cerebral artery supplied via the basilar and are well perfused to their distal aspects without stenosis. Venous sinuses: Patent. Anatomic variants: None significant. No aneurysm or vascular malformation. Delayed phase: No pathologic enhancement. Review of the MIP images confirms the above findings IMPRESSION: 1. Negative CTA for large vessel occlusion. No high-grade or correctable stenosis. 2. Multifocal irregularity involving the mid and distal ICAs bilaterally, consistent with FMD. 3. Ectatic ascending aorta measuring up to 3.8 cm in diameter. Recommend annual imaging  followup by CTA or MRA. This recommendation follows 2010 ACCF/AHA/AATS/ACR/ASA/SCA/SCAI/SIR/STS/SVM Guidelines for the Diagnosis and Management of Patients with Thoracic Aortic Disease. Circulation.2010; 121: V956-L875. 4. Minor carotid siphon atherosclerosis for age. Electronically Signed   By: Jeannine Boga M.D.   On: 09/19/2017 23:01   Ct Angio Neck W Or Wo Contrast  Result Date: 09/19/2017 CLINICAL DATA:  Initial evaluation for branch right neural artery occlusion in left eye. EXAM: CT ANGIOGRAPHY HEAD AND NECK TECHNIQUE: Multidetector CT imaging of the head and neck was performed using the standard protocol during bolus administration of intravenous contrast. Multiplanar CT image reconstructions and MIPs were obtained to evaluate the vascular anatomy. Carotid stenosis measurements (when applicable) are obtained utilizing NASCET criteria, using the distal internal carotid diameter as the denominator. CONTRAST:  50 cc of Isovue 370. COMPARISON:  Prior MRI from 03/04/2015. FINDINGS: CT HEAD FINDINGS Brain: Generalized age related cerebral atrophy with mild chronic small vessel ischemic disease. No acute intracranial hemorrhage. No evidence for acute large vessel territory infarct. No mass lesion, midline shift or mass effect. No hydrocephalus. No extra-axial fluid collection. Vascular: No hyperdense vessel. Scattered vascular calcifications noted within the carotid siphons. Skull: Scalp soft tissues and calvarium within normal limits. Sinuses: Small right sphenoid sinus retention cyst. Paranasal sinuses otherwise clear. No mastoid effusion. Orbits: Globes and orbital soft tissues within normal limits. Review of the MIP  images confirms the above findings CTA NECK FINDINGS Aortic arch: Visualized aortic arch somewhat ectatic measuring up to 3.8 cm. Mild atheromatous plaque within the intrathoracic aorta itself. Normal branch pattern at the arch without flow-limiting stenosis about the origin of the great  vessels. Visualized subclavian artery is widely patent. Right carotid system: Right common and internal carotid arteries patent to the skullbase without flow-limiting stenosis, dissection, or occlusion. No significant atheromatous narrowing about the right carotid bifurcation. Multifocal irregularity and beading of the mid and distal right ICA, suggestive of FMD. Left carotid system: Left common and internal carotid arteries are patent to the skullbase without stenosis, dissection, or occlusion. Subtle irregularity about the mid and distal left ICA suggestive of FMD. Distal left ICA somewhat ectatic measuring up to 7 mm without focal aneurysm. Vertebral arteries: Both vertebral arteries arise from the subclavian arteries. Left vertebral artery dominant. Vertebral arteries widely patent within the neck without stenosis, dissection, or occlusion. Skeleton: No acute osseous abnormality. No worrisome lytic or blastic osseous lesions. Moderate degenerate spondylolysis noted at C5-6 and C6-7. Grade 1 anterolisthesis of C3 on C4. Other neck: Soft tissues of the neck demonstrate no acute abnormality. Salivary glands normal. No adenopathy. Subcentimeter hypodense nodule noted within the right thyroid, of doubtful significance. Upper chest: Visualized upper chest within normal limits. Scattered atelectatic changes noted within the lungs. Visualized lungs otherwise grossly clear. Review of the MIP images confirms the above findings CTA HEAD FINDINGS Anterior circulation: Internal carotid artery is widely patent to the termini without flow-limiting stenosis. Minimal plaque noted within the cavernous segments without stenosis. Ophthalmic arteries grossly patent proximally. A1 segments patent bilaterally. Normal anterior communicating artery. Anterior cerebral arteries widely patent to their distal aspects without stenosis. Patent M1 segments without stenosis. Normal MCA bifurcations. No proximal M2 occlusion distal MCA branches  well opacified and symmetric. Posterior circulation: Vertebral arteries patent to the vertebrobasilar junction without stenosis. Patent right PICA. Left PICA not well visualized. Basilar artery widely patent to its distal aspect. Superior cerebral arteries patent bilaterally. Both of the posterior cerebral artery supplied via the basilar and are well perfused to their distal aspects without stenosis. Venous sinuses: Patent. Anatomic variants: None significant. No aneurysm or vascular malformation. Delayed phase: No pathologic enhancement. Review of the MIP images confirms the above findings IMPRESSION: 1. Negative CTA for large vessel occlusion. No high-grade or correctable stenosis. 2. Multifocal irregularity involving the mid and distal ICAs bilaterally, consistent with FMD. 3. Ectatic ascending aorta measuring up to 3.8 cm in diameter. Recommend annual imaging followup by CTA or MRA. This recommendation follows 2010 ACCF/AHA/AATS/ACR/ASA/SCA/SCAI/SIR/STS/SVM Guidelines for the Diagnosis and Management of Patients with Thoracic Aortic Disease. Circulation.2010; 121: Z610-R604. 4. Minor carotid siphon atherosclerosis for age. Electronically Signed   By: Jeannine Boga M.D.   On: 09/19/2017 23:01   Mr Brain Wo Contrast  Result Date: 09/20/2017 CLINICAL DATA:  Acute onset LEFT vision loss from retinal artery occlusion. History of hypercholesterolemia. EXAM: MRI HEAD WITHOUT CONTRAST TECHNIQUE: Multiplanar, multiecho pulse sequences of the brain and surrounding structures were obtained without intravenous contrast. COMPARISON:  CT angiogram head and neck and CT HEAD September 19, 2017 and MRI of the head March 04, 2015 FINDINGS: BRAIN: No reduced diffusion to suggest acute ischemia with particular attention to the optic tracts. No susceptibility artifact to suggest hemorrhage. The ventricles and sulci are normal for patient's age. LEFT inferior basal ganglia perivascular spaces. Unchanged scattered  subcentimeter and patchy supratentorial white matter FLAIR T2 hyperintensities compatible with mild to  moderate chronic small vessel ischemic disease, less than expected for age. No suspicious parenchymal signal, mass or mass effect. No abnormal extra-axial fluid collections. VASCULAR: Normal major intracranial vascular flow voids present at skull base. SKULL AND UPPER CERVICAL SPINE: No abnormal sellar expansion. No suspicious calvarial bone marrow signal. Craniocervical junction maintained. SINUSES/ORBITS: The mastoid air-cells and included paranasal sinuses are well-aerated. The included ocular globes and orbital contents are non-suspicious. OTHER: None. IMPRESSION: No acute intracranial process ; stable negative noncontrast MRI of the head for age. Electronically Signed   By: Elon Alas M.D.   On: 09/20/2017 01:43    2D ECHO: Study Conclusions  - Left ventricle: The cavity size was normal. There was mild focal   basal hypertrophy of the septum. Systolic function was mildly   reduced. The estimated ejection fraction was in the range of 45%   to 50%. There is hypokinesis of the mid-apicalanteroseptal   myocardium. Doppler parameters are consistent with abnormal left   ventricular relaxation (grade 1 diastolic dysfunction). - Ventricular septum: Septal motion showed abnormal function and   dyssynergy. - Aortic valve: There was mild regurgitation. - Mitral valve: There was trivial regurgitation. - Tricuspid valve: There was mild regurgitation.  Impressions:  - No cardiac source of emboli was indentified.   Disposition and Follow-up: Discharge Instructions    Ambulatory referral to Neurology    Complete by:  As directed    An appointment is requested in approximately: 6 Week(s): for TIA   Diet - low sodium heart healthy    Complete by:  As directed    Increase activity slowly    Complete by:  As directed        DISPOSITION: Home   DISCHARGE FOLLOW-UP Follow-up  Information    Crist Infante, MD. Schedule an appointment as soon as possible for a visit in 2 week(s).   Specialty:  Internal Medicine Contact information: [place] [address] [phone]        Rosalin Hawking, MD. Schedule an appointment as soon as possible for a visit in 6 week(s).   Specialty:  Neurology Contact information: [address] [place] [address] [phone]            Time spent on Discharge: 48mns   Signed:   REstill CottaM.D. [facility] 09/20/2017, 2:43 PM Pager: [phone]

## 2017-09-20 NOTE — Progress Notes (Addendum)
STROKE TEAM PROGRESS NOTE   SUBJECTIVE (INTERVAL HISTORY) Her husband is at the bedside.  Overall she feels her condition is completely resolved, all symptoms spontaneously resolved at 7PM last evening. Patient states she has tried Lipitor and Crestor in the past with "bad leg cramps". Voices no new complaints on exam today. No new events reported overnight.  OBJECTIVE Temp:  [97.5 F (36.4 C)-98.6 F (37 C)] 98.2 F (36.8 C) (10/26 1304) Pulse Rate:  [56-71] 63 (10/26 1304) Cardiac Rhythm: Normal sinus rhythm (10/26 0830) Resp:  [15-22] 19 (10/26 1304) BP: (100-140)/(51-82) 105/51 (10/26 1304) SpO2:  [87 %-100 %] 98 % (10/26 1304) Weight:  [68.1 kg (150 lb 2.1 oz)-69.8 kg (153 lb 12.8 oz)] 69.8 kg (153 lb 12.8 oz) (10/25 2250)  No results for input(s): GLUCAP in the last 168 hours.  Recent Labs Lab 09/19/17 1820 09/19/17 1829  NA 138 140  K 3.7 3.7  CL 106 104  CO2 25  --   GLUCOSE 97 92  BUN 20 22*  CREATININE 0.92 0.90  CALCIUM 9.2  --     Recent Labs Lab 09/19/17 1820  AST 23  ALT 19  ALKPHOS 72  BILITOT 0.4  PROT 6.3*  ALBUMIN 4.0    Recent Labs Lab 09/19/17 1820 09/19/17 1829  WBC 6.6  --   NEUTROABS 3.7  --   HGB 13.6 13.6  HCT 40.3 40.0  MCV 94.4  --   PLT 276  --    No results for input(s): CKTOTAL, CKMB, CKMBINDEX, TROPONINI in the last 168 hours.  Recent Labs  09/19/17 1820  LABPROT 12.5  INR 0.94   No results for input(s): COLORURINE, LABSPEC, PHURINE, GLUCOSEU, HGBUR, BILIRUBINUR, KETONESUR, PROTEINUR, UROBILINOGEN, NITRITE, LEUKOCYTESUR in the last 72 hours.  Invalid input(s): APPERANCEUR     Component Value Date/Time   CHOL 205 (H) 09/20/2017 0607   TRIG 49 09/20/2017 0607   HDL 52 09/20/2017 0607   CHOLHDL 3.9 09/20/2017 0607   VLDL 10 09/20/2017 0607   LDLCALC 143 (H) 09/20/2017 0607   Lab Results  Component Value Date   HGBA1C 5.5 09/20/2017   No results found for: LABOPIA, COCAINSCRNUR, LABBENZ, AMPHETMU, THCU, LABBARB   No results for input(s): ETH in the last 168 hours.  I have personally reviewed the radiological images below and agree with the radiology interpretations.  Ct Angio Head W Or Wo Contrast Result Date: 09/19/2017 IMPRESSION: 1. Negative CTA for large vessel occlusion. No high-grade or correctable stenosis. 2. Multifocal irregularity involving the mid and distal ICAs bilaterally, consistent with FMD. 3. Ectatic ascending aorta measuring up to 3.8 cm in diameter. Recommend annual imaging followup by CTA or MRA. This recommendation follows  2010 ACCF/AHA/AATS/ACR/ASA/SCA/SCAI/SIR/STS/SVM Guidelines for the Diagnosis and Management of Patients with Thoracic Aortic Disease. Circulation.2010; 121: F643-P295. 4. Minor carotid siphon atherosclerosis for age.   Ct Angio Neck W Or Wo Contrast Result Date: 09/19/2017 IMPRESSION: 1. Negative CTA for large vessel occlusion. No high-grade or correctable stenosis. 2. Multifocal irregularity involving the mid and distal ICAs bilaterally, consistent with FMD. 3. Ectatic ascending aorta measuring up to 3.8 cm in diameter. Recommend annual imaging followup by CTA or MRA. This recommendation follows 2010 ACCF/AHA/AATS/ACR/ASA/SCA/SCAI/SIR/STS/SVM Guidelines for the Diagnosis and Management of Patients with Thoracic Aortic Disease. Circulation.2010; 121: J884-Z660. 4. Minor carotid siphon atherosclerosis for age.   Mr Brain Wo Contrast Result Date: 09/20/2017 IMPRESSION: No acute intracranial process; stable negative noncontrast MRI of the head for age.  2D Echocardiogram Left ventricle:  The cavity size was normal. There was mild focal basal hypertrophy of the septum. Systolic function was mildly reduced. The estimated ejection fraction was in the range of 45% to 50%. There is hypokinesis of the mid-apicalanteroseptal myocardium.LV grade 1 diastolic dysfunction. Ventricular septum: Septal motion showed abnormal function and dyssynergy. Aortic, mitral and tricuspid  valve: there was mild regurgitation. No cardiac source of emboli was indentified.   PHYSICAL EXAM  Temp:  [97.5 F (36.4 C)-98.6 F (37 C)] 98.2 F (36.8 C) (10/26 1304) Pulse Rate:  [56-71] 63 (10/26 1304) Resp:  [15-22] 19 (10/26 1304) BP: (100-140)/(51-82) 105/51 (10/26 1304) SpO2:  [87 %-100 %] 98 % (10/26 1304) Weight:  [68.1 kg (150 lb 2.1 oz)-69.8 kg (153 lb 12.8 oz)] 69.8 kg (153 lb 12.8 oz) (10/25 2250)  General - Well nourished, well developed, in no apparent distress Respiratory - Lungs clear bilaterally. No wheezing. Cardiovascular - Regular rate and rhythm with no murmur  Mental Status -  Level of arousal and orientation to time, place, and person were intact. Language including expression, naming, repetition, comprehension was assessed and found intact Attention span and concentration were normal Recent and remote memory were intact Fund of Knowledge was assessed and was intact  Cranial Nerves II - XII - II - Visual field intact OU III, IV, VI - Extraocular movements intact V - Facial sensation intact bilaterally VII - Facial movement intact bilaterally VIII - Hearing & vestibular intact bilaterally X - Palate elevates symmetrically XI - Chin turning & shoulder shrug intact bilaterally XII - Tongue protrusion intact  Motor Strength - The patient's strength was normal in all extremities and pronator drift was absent.  Bulk was normal and fasciculations were absent   Motor Tone - Muscle tone was assessed at the neck and appendages and was normal  Reflexes - The patient's reflexes were symmetrical in all extremities and she had no pathological reflexes  Sensory - Light touch was symmetrical   Coordination - The patient had normal movements in the hands and feet with no ataxia or dysmetria.  Tremor was absent.  Gait and Station - deferred.   ASSESSMENT/PLAN [name] is a 76 y.o. female with a PMH of  history of hyperlipidemia intolerant to statins  and hypothyroidism admitted for Branch Retinal artery occlusion and complaints of RUQ vision loss. All symptoms spontaneously resolved at 7 PM yesterday.  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  CTA Head/Neck - Negative CTA for large vessel occlusion/stenosis  MRI Head -  No acute intracranial process  2D Echo  EF 45% to 50%.   Recommend 30-day cardiac event monitoring as outpatient to rule out A. fib.  Patient would like to discuss with PCP regarding this.  LDL 143  HgbA1c 5.5  ESR/CRP negative  Lovenox for VTE prophylaxis  Diet Heart Room service appropriate? Yes; Fluid consistency: Thin   No antithrombotic prior to admission, now on aspirin 325 mg daily. Please continue ASA upon discharge  Patient counseled to be compliant with her antithrombotic medications  Ongoing aggressive stroke risk factor management  Therapy recommendations:  None  Disposition:  HOME, Close follow up with PCP. Follow up with [facility] in 6 weeks   Hyperlipidemia  Home meds:  None  Pt has intolerance history to statins. Has tried Lipitor and Crestor with "bad leg cramps". She states that her PCP recently wrote here another Rx for a statin she cannot remember the name of,  the Rx was filled and the bottle is at home.   If patient continues to be intolerant to new statin may want to consider a PCSK-9 inhibitor, such as, Praluent or Repatha  LDL 143, goal < 70  Patient to start new statin prescribed by PCP at home, states does not remember the name of the medication but the Rx has been filled.  Other Stroke Risk Factors  Advanced age  Other Active Problems  History of menopausal symptoms on hormone replacement  Hospital day # 0  Monica White Stroke Neurology Team 09/20/2017 1:13 PM   I reviewed above note and agree with the assessment and plan. I have made any additions or clarifications directly to the above  note. Pt was seen and examined.  Stated that her left eye vision has been back to normal.  Etiology not clear, cannot rule out embolic source.  Recommend 30-day cardiac event monitoring to rule out A. fib, patient would like to her PCP to arrange.  Risk factor including hyperlipidemia, intolerant with previous statins.  Has new statin ordered at home, would like to start as outpatient.  If still not tolerant, consider PCSK9 inhibitors with PCP.  Continue aspirin on discharge. Neurology will sign off. Please call with questions. Pt will follow up with Monica Rubin, NP, at [facility] in about 6 weeks. Thanks for the consult.  I spent  35 minutes in total face-to-face time with the patient, more than 50% of which was spent in counseling and coordination of care, reviewing test results, images and medication, and discussing the diagnosis of CRAO, treatment plan and potential prognosis. This patient's care requiresreview of multiple databases, neurological assessment, discussion with family, other specialists and medical decision making of high complexity. I had long discussion with patient and the husband at bedside, updated pt current condition, treatment plan and potential prognosis. They expressed understanding and appreciation.  I also discussed with Dr. Tana Coast.  Rosalin Hawking, MD PhD Stroke Neurology 09/20/2017 4:21 PM     To contact Stroke Continuity provider, please refer to [email or web address] After hours, contact General Neurology

## 2017-09-20 NOTE — Progress Notes (Signed)
Discharge instructions given. Pt verbalized understanding and all questions were answered.  

## 2017-09-20 NOTE — Care Management Note (Signed)
Case Management Note  Patient Details  Name: Monica White MRN: [id number] Date of Birth: 1941-02-26  Subjective/Objective:   Pt in to r/o CVA. She is from home with spouse.                 Action/Plan: No f/u per PT/OT. Pt discharging home with self care. Pt has PCP, insurance and transportation home. No further needs per CM.  Expected Discharge Date:  09/20/17               Expected Discharge Plan:  Home/Self Care  In-House Referral:     Discharge planning Services     Post Acute Care Choice:    Choice offered to:     DME Arranged:    DME Agency:     HH Arranged:    HH Agency:     Status of Service:  Completed, signed off  If discussed at [employer] of Stay Meetings, dates discussed:    Additional Comments:  Pollie Friar, RN 09/20/2017, 2:48 PM

## 2017-09-20 NOTE — Progress Notes (Signed)
  Echocardiogram 2D Echocardiogram has been performed.  Jennette Dubin 09/20/2017, 10:24 AM

## 2017-09-20 NOTE — Progress Notes (Signed)
PT Cancellation Note  Patient Details Name: Monica White MRN: [id number] DOB: 12-Dec-1940   Cancelled Treatment:    Reason Eval/Treat Not Completed: PT screened, no needs identified, will sign off. Pt evaluated by OT with no acute PT needs identified. PT signing off.    [place] 09/20/2017, 9:02 AM

## 2017-09-20 NOTE — Progress Notes (Signed)
Occupational Therapy Evaluation Patient Details Name: Monica White MRN: [id number] DOB: 11/03/1941 Today's Date: 09/20/2017    History of Present Illness 76 y.o. female with history of hyperlipidemia intolerant to statins, hypothyroidism started experiencing sudden onset of left eye blurred vision.  Patient had dilated eye exam and ophthalmologist diagnosed with central retinal artery occlusion and was referred to the ER.Marland Kitchen MRI -.   Clinical Impression   Pt at baseline level of function. Educated pt on warning signs/sypmtoms of CVA. Pt safe to DC home when medically stable.     Follow Up Recommendations  No OT follow up    Equipment Recommendations  None recommended by OT    Recommendations for Other Services       Precautions / Restrictions Precautions Precautions: None      Mobility Bed Mobility Overal bed mobility: Independent                Transfers Overall transfer level: Independent                    Balance Overall balance assessment: Independent                                         ADL either performed or assessed with clinical judgement   ADL Overall ADL's : At baseline                                             Vision Baseline Vision/History: No visual deficits Patient Visual Report:  (returned to normal) Vision Assessment?: Yes;No apparent visual deficits Eye Alignment: Within Functional Limits Ocular Range of Motion: Within Functional Limits Alignment/Gaze Preference: Within Defined Limits Tracking/Visual Pursuits: Able to track stimulus in all quads without difficulty Saccades: Within functional limits Convergence: Within functional limits Visual Fields: No apparent deficits     Perception     Praxis      Pertinent Vitals/Pain Pain Assessment: No/denies pain     Hand Dominance Right   Extremity/Trunk Assessment Upper Extremity Assessment Upper Extremity Assessment: Overall WFL for  tasks assessed   Lower Extremity Assessment Lower Extremity Assessment: Overall WFL for tasks assessed   Cervical / Trunk Assessment Cervical / Trunk Assessment: Normal   Communication Communication Communication: No difficulties   Cognition Arousal/Alertness: Awake/alert Behavior During Therapy: WFL for tasks assessed/performed Overall Cognitive Status: Within Functional Limits for tasks assessed                                     General Comments       Exercises     Shoulder Instructions      Home Living Family/patient expects to be discharged to:: Private residence Living Arrangements: Spouse/significant other Available Help at Discharge: Family;Available 24 hours/day Type of Home: House       Home Layout: One level     Bathroom Shower/Tub: Tub/shower unit;Walk-in shower   Bathroom Toilet: Standard Bathroom Accessibility: Yes How Accessible: Accessible via walker Home Equipment: None          Prior Functioning/Environment Level of Independence: Independent        Comments: active PTA; golfer; works out frequently        OT Problem List: Impaired  vision/perception      OT Treatment/Interventions:      OT Goals(Current goals can be found in the care plan section) Acute Rehab OT Goals Patient Stated Goal: to go home OT Goal Formulation: All assessment and education complete, DC therapy  OT Frequency:     Barriers to D/C:            Co-evaluation              AM-PAC PT "6 Clicks" Daily Activity     Outcome Measure Help from another person eating meals?: None Help from another person taking care of personal grooming?: None Help from another person toileting, which includes using toliet, bedpan, or urinal?: None Help from another person bathing (including washing, rinsing, drying)?: None Help from another person to put on and taking off regular upper body clothing?: None Help from another person to put on and taking off  regular lower body clothing?: None 6 Click Score: 24   End of Session Nurse Communication: Other (comment) (independent ambulator)  Activity Tolerance: Patient tolerated treatment well Patient left: in chair;with call bell/phone within reach  OT Visit Diagnosis: Low vision, both eyes (H54.2)                Time: 0347-4259 OT Time Calculation (min): 15 min Charges:  OT General Charges $OT Visit: 1 Visit OT Evaluation $OT Eval Low Complexity: 1 Low G-Codes: OT G-codes **NOT FOR INPATIENT CLASS** Functional Assessment Tool Used: Clinical judgement Functional Limitation: Self care Self Care Current Status (D6387): 0 percent impaired, limited or restricted Self Care Goal Status (F6433): 0 percent impaired, limited or restricted Self Care Discharge Status (I9518): 0 percent impaired, limited or restricted   [facility], OT/L  [phone] 09/20/2017  Monica White,HILLARY 09/20/2017, 9:16 AM

## 2017-09-23 DIAGNOSIS — H349 Unspecified retinal vascular occlusion: Secondary | ICD-10-CM | POA: Diagnosis not present

## 2017-09-27 ENCOUNTER — Telehealth: Payer: Self-pay | Admitting: *Deleted

## 2017-09-27 DIAGNOSIS — Z23 Encounter for immunization: Secondary | ICD-10-CM | POA: Diagnosis not present

## 2017-09-27 DIAGNOSIS — E785 Hyperlipidemia, unspecified: Secondary | ICD-10-CM | POA: Diagnosis not present

## 2017-09-27 DIAGNOSIS — E7849 Other hyperlipidemia: Secondary | ICD-10-CM | POA: Diagnosis not present

## 2017-09-27 NOTE — Telephone Encounter (Signed)
NOTES SENT TO SCHEDULING.  °

## 2017-10-20 DIAGNOSIS — I773 Arterial fibromuscular dysplasia: Secondary | ICD-10-CM | POA: Diagnosis not present

## 2017-10-20 DIAGNOSIS — I509 Heart failure, unspecified: Secondary | ICD-10-CM | POA: Diagnosis not present

## 2017-10-20 DIAGNOSIS — E7849 Other hyperlipidemia: Secondary | ICD-10-CM | POA: Diagnosis not present

## 2017-10-20 DIAGNOSIS — I700 Atherosclerosis of aorta: Secondary | ICD-10-CM | POA: Diagnosis not present

## 2017-10-20 DIAGNOSIS — H34232 Retinal artery branch occlusion, left eye: Secondary | ICD-10-CM | POA: Diagnosis not present

## 2017-10-20 DIAGNOSIS — Z6822 Body mass index (BMI) 22.0-22.9, adult: Secondary | ICD-10-CM | POA: Diagnosis not present

## 2017-10-20 DIAGNOSIS — I712 Thoracic aortic aneurysm, without rupture: Secondary | ICD-10-CM | POA: Diagnosis not present

## 2017-12-27 DIAGNOSIS — E538 Deficiency of other specified B group vitamins: Secondary | ICD-10-CM | POA: Diagnosis not present

## 2017-12-27 DIAGNOSIS — E038 Other specified hypothyroidism: Secondary | ICD-10-CM | POA: Diagnosis not present

## 2017-12-27 DIAGNOSIS — R82998 Other abnormal findings in urine: Secondary | ICD-10-CM | POA: Diagnosis not present

## 2017-12-27 DIAGNOSIS — E559 Vitamin D deficiency, unspecified: Secondary | ICD-10-CM | POA: Diagnosis not present

## 2017-12-27 DIAGNOSIS — E7849 Other hyperlipidemia: Secondary | ICD-10-CM | POA: Diagnosis not present

## 2018-01-03 DIAGNOSIS — I700 Atherosclerosis of aorta: Secondary | ICD-10-CM | POA: Diagnosis not present

## 2018-01-03 DIAGNOSIS — R51 Headache: Secondary | ICD-10-CM | POA: Diagnosis not present

## 2018-01-03 DIAGNOSIS — H34232 Retinal artery branch occlusion, left eye: Secondary | ICD-10-CM | POA: Diagnosis not present

## 2018-01-03 DIAGNOSIS — Z6822 Body mass index (BMI) 22.0-22.9, adult: Secondary | ICD-10-CM | POA: Diagnosis not present

## 2018-01-03 DIAGNOSIS — I872 Venous insufficiency (chronic) (peripheral): Secondary | ICD-10-CM | POA: Diagnosis not present

## 2018-01-03 DIAGNOSIS — F3289 Other specified depressive episodes: Secondary | ICD-10-CM | POA: Diagnosis not present

## 2018-01-03 DIAGNOSIS — I773 Arterial fibromuscular dysplasia: Secondary | ICD-10-CM | POA: Diagnosis not present

## 2018-01-03 DIAGNOSIS — I712 Thoracic aortic aneurysm, without rupture: Secondary | ICD-10-CM | POA: Diagnosis not present

## 2018-01-03 DIAGNOSIS — I509 Heart failure, unspecified: Secondary | ICD-10-CM | POA: Diagnosis not present

## 2018-01-03 DIAGNOSIS — E038 Other specified hypothyroidism: Secondary | ICD-10-CM | POA: Diagnosis not present

## 2018-01-03 DIAGNOSIS — Z1389 Encounter for screening for other disorder: Secondary | ICD-10-CM | POA: Diagnosis not present

## 2018-01-03 DIAGNOSIS — Z23 Encounter for immunization: Secondary | ICD-10-CM | POA: Diagnosis not present

## 2018-01-03 DIAGNOSIS — Z0000 Encounter for general adult medical examination without abnormal findings: Secondary | ICD-10-CM | POA: Diagnosis not present

## 2018-01-09 DIAGNOSIS — Z1212 Encounter for screening for malignant neoplasm of rectum: Secondary | ICD-10-CM | POA: Diagnosis not present

## 2018-01-17 ENCOUNTER — Telehealth: Payer: Self-pay | Admitting: *Deleted

## 2018-01-17 NOTE — Telephone Encounter (Signed)
Called and faxed to request recent medical records from  Dr Crist Infante of [facility] for referred patient Monica White.  [phone]

## 2018-01-22 ENCOUNTER — Encounter: Payer: Self-pay | Admitting: Internal Medicine

## 2018-01-24 ENCOUNTER — Encounter: Payer: Self-pay | Admitting: Internal Medicine

## 2018-01-24 ENCOUNTER — Ambulatory Visit (INDEPENDENT_AMBULATORY_CARE_PROVIDER_SITE_OTHER): Payer: Medicare Other | Admitting: Internal Medicine

## 2018-01-24 VITALS — BP 124/72 | HR 63 | Ht 67.0 in | Wt 154.1 lb

## 2018-01-24 DIAGNOSIS — H3412 Central retinal artery occlusion, left eye: Secondary | ICD-10-CM | POA: Diagnosis not present

## 2018-01-24 DIAGNOSIS — R931 Abnormal findings on diagnostic imaging of heart and coronary circulation: Secondary | ICD-10-CM

## 2018-01-24 MED ORDER — ROSUVASTATIN CALCIUM 20 MG PO TABS: 20.0000 mg | ORAL_TABLET | Freq: Every day | ORAL | 3 refills | Status: DC

## 2018-01-24 NOTE — Patient Instructions (Signed)
Medication Instructions:  Your physician has recommended you make the following change in your medication:  1.) stop simvastatin 2.) stop ezetimibe 3.) start rosuvastatin (Crestor) 20 mg    Labwork: Your physician recommends that you return for lab work in: 2 months (LIPIDS)   Testing/Procedures: Your physician has recommended that you wear an event monitor. Event monitors are medical devices that record the heart's electrical activity. Doctors most often [place] these monitors to diagnose arrhythmias. Arrhythmias are problems with the speed or rhythm of the heartbeat. The monitor is a small, portable device. You can wear one while you do your normal daily activities. This is usually used to diagnose what is causing palpitations/syncope (passing out).  We will plan to schedule an MRI of the heart.  A scheduler will be contacting you with further information.  Follow-Up: Follow up with your physician will depend on test results.   Any Other Special Instructions Will Be Listed Below (If Applicable).     If you need a refill on your cardiac medications before your next appointment, please call your pharmacy.

## 2018-02-02 NOTE — Progress Notes (Signed)
Cardiology Office Note   Date:  02/02/2018   ID:  Erick Blinks, DOB [birth year], MRN [id number]  PCP:  Crist Infante, MD  Cardiologist:   Dorris Carnes, MD   Pt isr eferred for abnormal echo by Dr Joylene Draft    History of Present Illness: Monica White is a 77 y.o. female with a history of Hyperlipidemia and thoracic aortic aneurysm.  Also retinal artery occlusion on L     She is followed by [facility]  Echo in Sept 2013 showed normal LVEF   Repeat echo in October 2018 LVEF  Reported at 45 to 50% with hypokinesis of the mid/apicl atneroseptal wall    Talking to pati she feels fine Breathing is OK  Never had CP  Active   Plays golf  Bikes at gym [place] in October for central artery occlusin  MRI showed no infarct  CT angio with no occlusion  Echo as noted   Monitor never done  She deneis palpitatons     LDL in Feb 79  HDL 55   previouslly LDL w [id number]  Current Meds  Medication Sig  . aspirin 325 MG tablet Take 1 tablet (325 mg total) by mouth daily.  . calcium carbonate (OS-CAL) 600 MG TABS tablet Take 600 mg by mouth at bedtime.   . Cholecalciferol (VITAMIN D PO) Take by mouth.  . Cyanocobalamin (B-12) 1000 MCG CAPS Take by mouth.  . estropipate (ORTHO-EST 0.625) 0.75 MG tablet Take 1 tablet (0.75 mg total) by mouth daily. (Patient taking differently: Take 0.75 mg by mouth at bedtime. )  . levothyroxine (SYNTHROID, LEVOTHROID) 50 MCG tablet Take 1 tablet by mouth daily.  . medroxyPROGESTERone (PROVERA) 2.5 MG tablet Take 1 tablet (2.5 mg total) by mouth daily.  . Multiple Vitamins-Minerals (MULTIVITAMIN PO) Take by mouth.  . [DISCONTINUED] ezetimibe (ZETIA) 10 MG tablet Take 10 mg by mouth daily.  . [DISCONTINUED] simvastatin (ZOCOR) 20 MG tablet Take 1 tablet (20 mg total) by mouth every evening.     Allergies:   Patient has no known allergies.   Past Medical History:  Diagnosis Date  . Hypercholesteremia   . Hypothyroid 2015    Past Surgical History:  Procedure Laterality Date  .  TUBAL LIGATION  1985     Social History:  The patient  reports that  has never smoked. she has never used smokeless tobacco. She reports that she drinks about 0.6 - 1.2 oz of alcohol per week. She reports that she does not use drugs.   Family History:  The patient's family history includes Heart disease in her mother.    ROS:  Please see the history of present illness. All other systems are reviewed and  Negative to the above problem except as noted.    PHYSICAL EXAM: VS:  BP 124/72   Pulse 63   Ht 5\' 7"  (1.702 m)   Wt 154 lb 1.9 oz (69.9 kg)   LMP 01/13/1994   SpO2 97%   BMI 24.14 kg/m   GEN: Well nourished, well developed, in no acute distress  HEENT: normal  Neck: no JVD, carotid bruits, or masses Cardiac: RRR; no murmurs, rubs, or gallops,no edema  Respiratory:  clear to auscultation bilaterally, normal work of breathing GI: soft, nontender, nondistended, + BS  No hepatomegaly  MS: no deformity Moving all extremities   Skin: warm and dry, no rash Neuro:  Strength and sensation are intact Psych: euthymic mood, full affect   EKG:  EKG is not ordered  today.  On 8/19/ 13  SR 60 bpm  Septal infarct  Posslble lateral MI   Lipid Panel    Component Value Date/Time   CHOL 205 (H) 09/20/2017 0607   TRIG 49 09/20/2017 0607   HDL 52 09/20/2017 0607   CHOLHDL 3.9 09/20/2017 0607   VLDL 10 09/20/2017 0607   LDLCALC 143 (H) 09/20/2017 0607      Wt Readings from Last 3 Encounters:  01/24/18 154 lb 1.9 oz (69.9 kg)  09/19/17 153 lb 12.8 oz (69.8 kg)  03/23/16 151 lb 12.8 oz (68.9 kg)      ASSESSMENT AND PLAN:  1  Abnormal echo   I have reviewed echo   There is sl lag (jump) of septum   Overall LVEF is normal   I am not convinced of prior event to explain  Looks more like conduction delay  I would set up for cardiac MRI to define LVEF and wall motion, look for scarring    2  Neuro   No recurrence  Vision normalized  I would recomm a 30 day event monitor which she did not  have done to eval for atiral fib    F/U based on test results  Continue activities as tolerated     Current medicines are reviewed at length with the patient today.  The patient does not have concerns regarding medicines.  Signed, Dorris Carnes, MD  02/02/2018 3:38 AM    [address], [place], [place]  [address] Phone: [phone]; Fax: [phone]

## 2018-02-03 ENCOUNTER — Encounter: Payer: Self-pay | Admitting: Internal Medicine

## 2018-02-17 ENCOUNTER — Ambulatory Visit (HOSPITAL_COMMUNITY)
Admission: RE | Admit: 2018-02-17 | Discharge: 2018-02-17 | Disposition: A | Discharge status: Home / Self Care | Payer: Medicare Other | Source: Ambulatory Visit | Attending: Internal Medicine | Admitting: Internal Medicine

## 2018-02-17 DIAGNOSIS — I351 Nonrheumatic aortic (valve) insufficiency: Secondary | ICD-10-CM | POA: Diagnosis not present

## 2018-02-17 DIAGNOSIS — H3412 Central retinal artery occlusion, left eye: Secondary | ICD-10-CM | POA: Insufficient documentation

## 2018-02-17 LAB — CREATININE, SERUM
Creatinine, Ser: 0.85 mg/dL (ref 0.44–1.00)
GFR calc non Af Amer: >60 mL/min (ref >60)

## 2018-02-17 MED ORDER — GADOBENATE DIMEGLUMINE 529 MG/ML IV SOLN
20.0000 mL | Freq: Once | INTRAVENOUS | Status: AC | PRN
  Administered 2018-02-17: 20 mL via INTRAVENOUS

## 2018-03-26 ENCOUNTER — Other Ambulatory Visit: Payer: Medicare Other | Admitting: *Deleted

## 2018-03-26 ENCOUNTER — Telehealth: Payer: Self-pay | Admitting: Internal Medicine

## 2018-03-26 DIAGNOSIS — H3412 Central retinal artery occlusion, left eye: Secondary | ICD-10-CM

## 2018-03-26 LAB — LIPID PANEL
CHOLESTEROL TOTAL: 169 mg/dL (ref 100–199)
Chol/HDL Ratio: 2.6 ratio (ref 0.0–4.4)
HDL: 64 mg/dL (ref >39)
LDL Calculated: 90 mg/dL (ref 0–99)
Triglycerides: 73 mg/dL (ref 0–149)
VLDL Cholesterol Cal: 15 mg/dL (ref 5–40)

## 2018-03-26 NOTE — Telephone Encounter (Signed)
Walk In pt Form-pt asking how to send monitor in with I-Phone Watch.  Please call. Placed in [facility] doc box.

## 2018-03-27 ENCOUNTER — Telehealth: Payer: Self-pay | Admitting: *Deleted

## 2018-03-27 NOTE — Telephone Encounter (Signed)
When pt was here for blood work she asked about sending reports from her new apple watch to Dr. Harrington White.  I provided a activation code for My Chart and she will sign up and then upload as PDF. So far her rhythm according to the watch has not shown any abnormal rhythms.  She feels normal/fine.  She had to cancel her monitor appointment twice due to funerals.   wants to know if the watch is sufficient to detect atrial fibrillation or if she should make another appointment to wear event monitor.  Only wearing watch about 8 hrs/day.  I advised to leave on all the time.  She is aware I will forward to Dr. Harrington White for recommendation but I thought she probably will still recommend event monitor.  We will call her back with recommendations.

## 2018-03-27 NOTE — Telephone Encounter (Signed)
Would need to wear watch all the time I am not sure that watch will pick up afib that is mildly irregular or not fast With event that she had would get event monitor for 1 month   Can wear watch after

## 2018-03-31 NOTE — Telephone Encounter (Signed)
Spoke to patient and gave Dr. Alan Ripper recommendations. She has heard and is afraid that wearing a monitor will be horrible.  She's heard terrible things about wearing it.  Especially now that it's going to be summer.  I reassured pt that it is pretty small and usually well tolerated by many patients and it is a useful tool when the doctor is trying to rule in/out abnormal rhythms.   Pt states she will call back and schedule day to have monitor put on.  Also reviewed using MyChart with her again.  Her activation could I provided did not work.  Advised her to go to the web site and click the "no activation code" link.   Also provided cholesterol results.

## 2018-04-09 DIAGNOSIS — H52203 Unspecified astigmatism, bilateral: Secondary | ICD-10-CM | POA: Diagnosis not present

## 2018-04-09 DIAGNOSIS — H2513 Age-related nuclear cataract, bilateral: Secondary | ICD-10-CM | POA: Diagnosis not present

## 2018-04-09 DIAGNOSIS — H349 Unspecified retinal vascular occlusion: Secondary | ICD-10-CM | POA: Diagnosis not present

## 2018-04-09 DIAGNOSIS — H524 Presbyopia: Secondary | ICD-10-CM | POA: Diagnosis not present

## 2018-06-11 DIAGNOSIS — Z1231 Encounter for screening mammogram for malignant neoplasm of breast: Secondary | ICD-10-CM | POA: Diagnosis not present

## 2018-07-17 DIAGNOSIS — Z23 Encounter for immunization: Secondary | ICD-10-CM | POA: Diagnosis not present

## 2018-08-15 ENCOUNTER — Encounter: Payer: Self-pay | Admitting: Internal Medicine

## 2018-09-08 ENCOUNTER — Other Ambulatory Visit: Payer: Self-pay | Admitting: Internal Medicine

## 2018-09-08 DIAGNOSIS — I712 Thoracic aortic aneurysm, without rupture, unspecified: Secondary | ICD-10-CM

## 2018-09-10 ENCOUNTER — Telehealth: Payer: Self-pay | Admitting: Internal Medicine

## 2018-09-10 NOTE — Telephone Encounter (Signed)
New Message       Patient called to let the device clinic know that right now she  Is dealing with her husband health and right now she can deal with the device, however; she is track her self with her Apple watch.

## 2018-10-09 ENCOUNTER — Ambulatory Visit
Admission: RE | Admit: 2018-10-09 | Discharge: 2018-10-09 | Disposition: A | Discharge status: Home / Self Care | Payer: Medicare Other | Source: Ambulatory Visit | Attending: Internal Medicine | Admitting: Internal Medicine

## 2018-10-09 DIAGNOSIS — I712 Thoracic aortic aneurysm, without rupture, unspecified: Secondary | ICD-10-CM

## 2018-10-09 MED ORDER — IOPAMIDOL (ISOVUE-370) INJECTION 76%
75.0000 mL | Freq: Once | INTRAVENOUS | Status: AC | PRN
  Administered 2018-10-09: 75 mL via INTRAVENOUS

## 2019-01-27 DIAGNOSIS — R82998 Other abnormal findings in urine: Secondary | ICD-10-CM | POA: Diagnosis not present

## 2019-01-27 DIAGNOSIS — E538 Deficiency of other specified B group vitamins: Secondary | ICD-10-CM | POA: Diagnosis not present

## 2019-01-27 DIAGNOSIS — E559 Vitamin D deficiency, unspecified: Secondary | ICD-10-CM | POA: Diagnosis not present

## 2019-01-27 DIAGNOSIS — E038 Other specified hypothyroidism: Secondary | ICD-10-CM | POA: Diagnosis not present

## 2019-01-27 DIAGNOSIS — E7849 Other hyperlipidemia: Secondary | ICD-10-CM | POA: Diagnosis not present

## 2019-02-03 DIAGNOSIS — I712 Thoracic aortic aneurysm, without rupture: Secondary | ICD-10-CM | POA: Diagnosis not present

## 2019-02-03 DIAGNOSIS — Z1339 Encounter for screening examination for other mental health and behavioral disorders: Secondary | ICD-10-CM | POA: Diagnosis not present

## 2019-02-03 DIAGNOSIS — Z1331 Encounter for screening for depression: Secondary | ICD-10-CM | POA: Diagnosis not present

## 2019-02-03 DIAGNOSIS — E038 Other specified hypothyroidism: Secondary | ICD-10-CM | POA: Diagnosis not present

## 2019-02-03 DIAGNOSIS — I509 Heart failure, unspecified: Secondary | ICD-10-CM | POA: Diagnosis not present

## 2019-02-03 DIAGNOSIS — I700 Atherosclerosis of aorta: Secondary | ICD-10-CM | POA: Diagnosis not present

## 2019-02-03 DIAGNOSIS — Z23 Encounter for immunization: Secondary | ICD-10-CM | POA: Diagnosis not present

## 2019-02-03 DIAGNOSIS — I773 Arterial fibromuscular dysplasia: Secondary | ICD-10-CM | POA: Diagnosis not present

## 2019-02-03 DIAGNOSIS — I872 Venous insufficiency (chronic) (peripheral): Secondary | ICD-10-CM | POA: Diagnosis not present

## 2019-02-03 DIAGNOSIS — F3289 Other specified depressive episodes: Secondary | ICD-10-CM | POA: Diagnosis not present

## 2019-02-03 DIAGNOSIS — K449 Diaphragmatic hernia without obstruction or gangrene: Secondary | ICD-10-CM | POA: Diagnosis not present

## 2019-02-03 DIAGNOSIS — H34232 Retinal artery branch occlusion, left eye: Secondary | ICD-10-CM | POA: Diagnosis not present

## 2019-02-03 DIAGNOSIS — Z0000 Encounter for general adult medical examination without abnormal findings: Secondary | ICD-10-CM | POA: Diagnosis not present

## 2019-02-11 ENCOUNTER — Other Ambulatory Visit (HOSPITAL_COMMUNITY): Payer: Self-pay | Admitting: Internal Medicine

## 2019-02-11 ENCOUNTER — Other Ambulatory Visit: Payer: Self-pay

## 2019-02-11 ENCOUNTER — Ambulatory Visit (HOSPITAL_COMMUNITY)
Admission: RE | Admit: 2019-02-11 | Discharge: 2019-02-11 | Disposition: A | Discharge status: Home / Self Care | Payer: Medicare Other | Source: Ambulatory Visit | Attending: Family | Admitting: Family

## 2019-02-11 DIAGNOSIS — I773 Arterial fibromuscular dysplasia: Secondary | ICD-10-CM | POA: Diagnosis not present

## 2019-03-10 ENCOUNTER — Other Ambulatory Visit: Payer: Self-pay | Admitting: Internal Medicine

## 2019-03-10 NOTE — Telephone Encounter (Signed)
[facility] pharmacy is requesting a refill on rosuvastatin. Would Dr. Harrington Challenger like to refill this medication? Please address

## 2019-03-10 NOTE — Telephone Encounter (Signed)
Refill Crestor Rx

## 2019-03-11 MED ORDER — ROSUVASTATIN CALCIUM 20 MG PO TABS: 20.0000 mg | ORAL_TABLET | Freq: Every day | ORAL | 1 refills | Status: DC

## 2019-03-11 NOTE — Telephone Encounter (Signed)
Pt's medication was sent to pt's pharmacy as requested. Confirmation received.  °

## 2019-08-20 DIAGNOSIS — M62830 Muscle spasm of back: Secondary | ICD-10-CM | POA: Diagnosis not present

## 2019-08-20 DIAGNOSIS — M9901 Segmental and somatic dysfunction of cervical region: Secondary | ICD-10-CM | POA: Diagnosis not present

## 2019-08-20 DIAGNOSIS — M41126 Adolescent idiopathic scoliosis, lumbar region: Secondary | ICD-10-CM | POA: Diagnosis not present

## 2019-08-20 DIAGNOSIS — M545 Low back pain: Secondary | ICD-10-CM | POA: Diagnosis not present

## 2019-08-20 DIAGNOSIS — M9905 Segmental and somatic dysfunction of pelvic region: Secondary | ICD-10-CM | POA: Diagnosis not present

## 2019-08-20 DIAGNOSIS — M9903 Segmental and somatic dysfunction of lumbar region: Secondary | ICD-10-CM | POA: Diagnosis not present

## 2019-09-02 DIAGNOSIS — Z23 Encounter for immunization: Secondary | ICD-10-CM | POA: Diagnosis not present

## 2019-09-03 DIAGNOSIS — C44722 Squamous cell carcinoma of skin of right lower limb, including hip: Secondary | ICD-10-CM | POA: Diagnosis not present

## 2019-09-03 DIAGNOSIS — Z23 Encounter for immunization: Secondary | ICD-10-CM | POA: Diagnosis not present

## 2019-09-03 DIAGNOSIS — L821 Other seborrheic keratosis: Secondary | ICD-10-CM | POA: Diagnosis not present

## 2019-09-03 DIAGNOSIS — D485 Neoplasm of uncertain behavior of skin: Secondary | ICD-10-CM | POA: Diagnosis not present

## 2019-09-03 DIAGNOSIS — L570 Actinic keratosis: Secondary | ICD-10-CM | POA: Diagnosis not present

## 2019-09-15 ENCOUNTER — Other Ambulatory Visit: Payer: Self-pay

## 2019-09-15 ENCOUNTER — Ambulatory Visit
Admission: RE | Admit: 2019-09-15 | Discharge: 2019-09-15 | Disposition: A | Discharge status: Home / Self Care | Payer: Medicare Other | Source: Ambulatory Visit | Attending: Sports Medicine | Admitting: Sports Medicine

## 2019-09-15 ENCOUNTER — Ambulatory Visit (INDEPENDENT_AMBULATORY_CARE_PROVIDER_SITE_OTHER): Payer: Medicare Other | Admitting: Sports Medicine

## 2019-09-15 VITALS — BP 96/72 | Ht 67.5 in | Wt 150.0 lb

## 2019-09-15 DIAGNOSIS — M25561 Pain in right knee: Secondary | ICD-10-CM | POA: Diagnosis not present

## 2019-09-15 DIAGNOSIS — M1711 Unilateral primary osteoarthritis, right knee: Secondary | ICD-10-CM

## 2019-09-16 ENCOUNTER — Encounter: Payer: Self-pay | Admitting: Sports Medicine

## 2019-09-16 NOTE — Progress Notes (Signed)
   Subjective:    Patient ID: Monica White, female    DOB: 07-May-1941, 78 y.o.   MRN: [id number]  HPI chief complaint: Left knee and leg pain  Very pleasant 78 year old female comes in today complaining of 1 month of left leg and knee pain.  She denies any specific injury but rather describes a gradual onset of pain that seems to begin in the left knee but will radiate both proximally and distally.  She is very active and an avid golfer.  She has minimal symptoms with activity.  She does take ibuprofen before golfing which does seem to help.  She has not noticed any knee swelling.  She denies locking or catching in the knee.  She denies numbness or tingling.  Main reason for today's visit is to figure out what exactly is causing her pain.  She denies any previous hip or knee surgeries.  She does have a history of scoliosis.  Past medical history reviewed Medications reviewed Allergies reviewed  Review of Systems As above    Objective:   Physical Exam  Well-developed, well-nourished.  No acute distress.  Awake alert and oriented x3.  Vital signs reviewed  Right hip: Smooth painless hip range of motion with a negative logroll.  No tenderness to palpation.  Right knee: Patient has about a 3 degree extension lag.  Flexion is to 120 degrees.  No obvious effusion.  No joint line tenderness to palpation.  Good ligamentous stability.  Valgus thrust with standing.  Neurovascularly intact distally.  Walking with a slight limp.  X-rays of the right knee including AP, lateral, and sunrise views shows moderate lateral compartmental DJD.  Nothing acute.      Assessment & Plan:  Right knee and leg pain secondary to lateral compartmental DJD  I explained to the patient that I think her symptoms are coming from her right knee DJD.  She has purchased a knee sleeve and I recommended that she continue wearing this when active.  She will also start isometric quad exercises daily.  I recommended that she try  a combination ibuprofen/Tylenol medication at night before bedtime.  She can purchase this over-the-counter.  Follow-up with me in 4 weeks for reevaluation.  If symptoms persist consider merits of cortisone injection.  Call with questions or concerns in the interim.

## 2019-09-17 DIAGNOSIS — M41126 Adolescent idiopathic scoliosis, lumbar region: Secondary | ICD-10-CM | POA: Diagnosis not present

## 2019-09-17 DIAGNOSIS — M9903 Segmental and somatic dysfunction of lumbar region: Secondary | ICD-10-CM | POA: Diagnosis not present

## 2019-09-17 DIAGNOSIS — M9905 Segmental and somatic dysfunction of pelvic region: Secondary | ICD-10-CM | POA: Diagnosis not present

## 2019-09-17 DIAGNOSIS — M62830 Muscle spasm of back: Secondary | ICD-10-CM | POA: Diagnosis not present

## 2019-09-17 DIAGNOSIS — M545 Low back pain: Secondary | ICD-10-CM | POA: Diagnosis not present

## 2019-09-17 DIAGNOSIS — M9901 Segmental and somatic dysfunction of cervical region: Secondary | ICD-10-CM | POA: Diagnosis not present

## 2019-09-25 DIAGNOSIS — H2513 Age-related nuclear cataract, bilateral: Secondary | ICD-10-CM | POA: Diagnosis not present

## 2019-09-25 DIAGNOSIS — H349 Unspecified retinal vascular occlusion: Secondary | ICD-10-CM | POA: Diagnosis not present

## 2019-09-25 DIAGNOSIS — H52203 Unspecified astigmatism, bilateral: Secondary | ICD-10-CM | POA: Diagnosis not present

## 2019-09-25 DIAGNOSIS — H353131 Nonexudative age-related macular degeneration, bilateral, early dry stage: Secondary | ICD-10-CM | POA: Diagnosis not present

## 2019-10-09 DIAGNOSIS — C44729 Squamous cell carcinoma of skin of left lower limb, including hip: Secondary | ICD-10-CM | POA: Diagnosis not present

## 2019-10-13 ENCOUNTER — Other Ambulatory Visit: Payer: Self-pay

## 2019-10-13 ENCOUNTER — Ambulatory Visit (INDEPENDENT_AMBULATORY_CARE_PROVIDER_SITE_OTHER): Payer: Medicare Other | Admitting: Sports Medicine

## 2019-10-13 VITALS — BP 127/72 | Ht 67.0 in | Wt 150.0 lb

## 2019-10-13 DIAGNOSIS — M1711 Unilateral primary osteoarthritis, right knee: Secondary | ICD-10-CM

## 2019-10-13 NOTE — Progress Notes (Signed)
   Subjective:    Patient ID: Monica White, female    DOB: 02-07-1941, 78 y.o.   MRN: [id number]  HPI   Patient comes in today for follow-up on right knee pain secondary to lateral compartmental DJD.  Overall, she seems to be improving.  She has been diligent about doing her isometric quad exercises.  In fact, she is doing them for both legs.  She is able to do 3 sets of 30 reps fairly easily.  She has also been using topical Voltaren gel which has been helpful.   Review of Systems    As above Objective:   Physical Exam  Well-developed, well-nourished.  No acute distress.  Awake alert and oriented x3.  Vital signs reviewed.  Right knee: Range of motion is 0 to 120 degrees.  No effusion.  Moderate valgus thrust with standing and walking.  Mild limp.  Neurovascularly intact distally.      Assessment & Plan:   Right knee pain secondary to moderate lateral compartmental DJD  Patient seems to be improving with isometric quad exercises and topical Voltaren.  I have asked that she get a 2 pound ankle weight to increase the resistance of her exercises and start doing 3 sets of 20 reps daily for both legs.  We also discussed a cortisone injection but she would like to wait on that for now.  She understands that she can call the office at a later date for the injection if she changes her mind.  Otherwise, follow-up as needed.

## 2019-12-10 DIAGNOSIS — I509 Heart failure, unspecified: Secondary | ICD-10-CM | POA: Diagnosis not present

## 2019-12-31 DIAGNOSIS — Z85828 Personal history of other malignant neoplasm of skin: Secondary | ICD-10-CM | POA: Diagnosis not present

## 2019-12-31 DIAGNOSIS — D224 Melanocytic nevi of scalp and neck: Secondary | ICD-10-CM | POA: Diagnosis not present

## 2019-12-31 DIAGNOSIS — C44612 Basal cell carcinoma of skin of right upper limb, including shoulder: Secondary | ICD-10-CM | POA: Diagnosis not present

## 2019-12-31 DIAGNOSIS — D3612 Benign neoplasm of peripheral nerves and autonomic nervous system, upper limb, including shoulder: Secondary | ICD-10-CM | POA: Diagnosis not present

## 2019-12-31 DIAGNOSIS — L218 Other seborrheic dermatitis: Secondary | ICD-10-CM | POA: Diagnosis not present

## 2020-01-28 DIAGNOSIS — M41126 Adolescent idiopathic scoliosis, lumbar region: Secondary | ICD-10-CM | POA: Diagnosis not present

## 2020-01-28 DIAGNOSIS — M9905 Segmental and somatic dysfunction of pelvic region: Secondary | ICD-10-CM | POA: Diagnosis not present

## 2020-01-28 DIAGNOSIS — M545 Low back pain: Secondary | ICD-10-CM | POA: Diagnosis not present

## 2020-01-28 DIAGNOSIS — M9901 Segmental and somatic dysfunction of cervical region: Secondary | ICD-10-CM | POA: Diagnosis not present

## 2020-01-28 DIAGNOSIS — M9903 Segmental and somatic dysfunction of lumbar region: Secondary | ICD-10-CM | POA: Diagnosis not present

## 2020-01-28 DIAGNOSIS — M62830 Muscle spasm of back: Secondary | ICD-10-CM | POA: Diagnosis not present

## 2020-02-17 DIAGNOSIS — Z1231 Encounter for screening mammogram for malignant neoplasm of breast: Secondary | ICD-10-CM | POA: Diagnosis not present

## 2020-02-22 DIAGNOSIS — E559 Vitamin D deficiency, unspecified: Secondary | ICD-10-CM | POA: Diagnosis not present

## 2020-02-22 DIAGNOSIS — M41126 Adolescent idiopathic scoliosis, lumbar region: Secondary | ICD-10-CM | POA: Diagnosis not present

## 2020-02-22 DIAGNOSIS — E038 Other specified hypothyroidism: Secondary | ICD-10-CM | POA: Diagnosis not present

## 2020-02-22 DIAGNOSIS — E7849 Other hyperlipidemia: Secondary | ICD-10-CM | POA: Diagnosis not present

## 2020-02-22 DIAGNOSIS — M62830 Muscle spasm of back: Secondary | ICD-10-CM | POA: Diagnosis not present

## 2020-02-22 DIAGNOSIS — M9901 Segmental and somatic dysfunction of cervical region: Secondary | ICD-10-CM | POA: Diagnosis not present

## 2020-02-22 DIAGNOSIS — M545 Low back pain: Secondary | ICD-10-CM | POA: Diagnosis not present

## 2020-02-22 DIAGNOSIS — M9905 Segmental and somatic dysfunction of pelvic region: Secondary | ICD-10-CM | POA: Diagnosis not present

## 2020-02-22 DIAGNOSIS — M9903 Segmental and somatic dysfunction of lumbar region: Secondary | ICD-10-CM | POA: Diagnosis not present

## 2020-02-22 DIAGNOSIS — E538 Deficiency of other specified B group vitamins: Secondary | ICD-10-CM | POA: Diagnosis not present

## 2020-03-07 DIAGNOSIS — H34232 Retinal artery branch occlusion, left eye: Secondary | ICD-10-CM | POA: Diagnosis not present

## 2020-03-07 DIAGNOSIS — E785 Hyperlipidemia, unspecified: Secondary | ICD-10-CM | POA: Diagnosis not present

## 2020-03-07 DIAGNOSIS — I509 Heart failure, unspecified: Secondary | ICD-10-CM | POA: Diagnosis not present

## 2020-03-07 DIAGNOSIS — Z1339 Encounter for screening examination for other mental health and behavioral disorders: Secondary | ICD-10-CM | POA: Diagnosis not present

## 2020-03-07 DIAGNOSIS — N951 Menopausal and female climacteric states: Secondary | ICD-10-CM | POA: Diagnosis not present

## 2020-03-07 DIAGNOSIS — R9431 Abnormal electrocardiogram [ECG] [EKG]: Secondary | ICD-10-CM | POA: Diagnosis not present

## 2020-03-07 DIAGNOSIS — I712 Thoracic aortic aneurysm, without rupture: Secondary | ICD-10-CM | POA: Diagnosis not present

## 2020-03-07 DIAGNOSIS — I773 Arterial fibromuscular dysplasia: Secondary | ICD-10-CM | POA: Diagnosis not present

## 2020-03-07 DIAGNOSIS — F329 Major depressive disorder, single episode, unspecified: Secondary | ICD-10-CM | POA: Diagnosis not present

## 2020-03-07 DIAGNOSIS — Z1331 Encounter for screening for depression: Secondary | ICD-10-CM | POA: Diagnosis not present

## 2020-03-07 DIAGNOSIS — M25561 Pain in right knee: Secondary | ICD-10-CM | POA: Diagnosis not present

## 2020-03-07 DIAGNOSIS — Z0000 Encounter for general adult medical examination without abnormal findings: Secondary | ICD-10-CM | POA: Diagnosis not present

## 2020-03-07 DIAGNOSIS — K449 Diaphragmatic hernia without obstruction or gangrene: Secondary | ICD-10-CM | POA: Diagnosis not present

## 2020-03-07 DIAGNOSIS — I872 Venous insufficiency (chronic) (peripheral): Secondary | ICD-10-CM | POA: Diagnosis not present

## 2020-03-16 DIAGNOSIS — M1712 Unilateral primary osteoarthritis, left knee: Secondary | ICD-10-CM | POA: Diagnosis not present

## 2020-03-16 DIAGNOSIS — M1711 Unilateral primary osteoarthritis, right knee: Secondary | ICD-10-CM | POA: Diagnosis not present

## 2020-04-12 DIAGNOSIS — Z85828 Personal history of other malignant neoplasm of skin: Secondary | ICD-10-CM | POA: Diagnosis not present

## 2020-04-12 DIAGNOSIS — L570 Actinic keratosis: Secondary | ICD-10-CM | POA: Diagnosis not present

## 2020-04-12 DIAGNOSIS — L821 Other seborrheic keratosis: Secondary | ICD-10-CM | POA: Diagnosis not present

## 2020-06-07 DIAGNOSIS — M545 Low back pain: Secondary | ICD-10-CM | POA: Diagnosis not present

## 2020-06-07 DIAGNOSIS — M9901 Segmental and somatic dysfunction of cervical region: Secondary | ICD-10-CM | POA: Diagnosis not present

## 2020-06-07 DIAGNOSIS — M9905 Segmental and somatic dysfunction of pelvic region: Secondary | ICD-10-CM | POA: Diagnosis not present

## 2020-06-07 DIAGNOSIS — M41126 Adolescent idiopathic scoliosis, lumbar region: Secondary | ICD-10-CM | POA: Diagnosis not present

## 2020-06-07 DIAGNOSIS — M9903 Segmental and somatic dysfunction of lumbar region: Secondary | ICD-10-CM | POA: Diagnosis not present

## 2020-06-07 DIAGNOSIS — M62830 Muscle spasm of back: Secondary | ICD-10-CM | POA: Diagnosis not present

## 2020-06-09 DIAGNOSIS — M545 Low back pain: Secondary | ICD-10-CM | POA: Diagnosis not present

## 2020-06-09 DIAGNOSIS — M9903 Segmental and somatic dysfunction of lumbar region: Secondary | ICD-10-CM | POA: Diagnosis not present

## 2020-06-09 DIAGNOSIS — M9905 Segmental and somatic dysfunction of pelvic region: Secondary | ICD-10-CM | POA: Diagnosis not present

## 2020-06-09 DIAGNOSIS — M9901 Segmental and somatic dysfunction of cervical region: Secondary | ICD-10-CM | POA: Diagnosis not present

## 2020-06-09 DIAGNOSIS — M41126 Adolescent idiopathic scoliosis, lumbar region: Secondary | ICD-10-CM | POA: Diagnosis not present

## 2020-06-09 DIAGNOSIS — M62830 Muscle spasm of back: Secondary | ICD-10-CM | POA: Diagnosis not present

## 2020-06-27 DIAGNOSIS — M1712 Unilateral primary osteoarthritis, left knee: Secondary | ICD-10-CM | POA: Diagnosis not present

## 2020-06-27 DIAGNOSIS — M1711 Unilateral primary osteoarthritis, right knee: Secondary | ICD-10-CM | POA: Diagnosis not present

## 2020-09-05 DIAGNOSIS — I700 Atherosclerosis of aorta: Secondary | ICD-10-CM | POA: Diagnosis not present

## 2020-09-05 DIAGNOSIS — I712 Thoracic aortic aneurysm, without rupture: Secondary | ICD-10-CM | POA: Diagnosis not present

## 2020-09-05 DIAGNOSIS — I773 Arterial fibromuscular dysplasia: Secondary | ICD-10-CM | POA: Diagnosis not present

## 2020-09-05 DIAGNOSIS — H34232 Retinal artery branch occlusion, left eye: Secondary | ICD-10-CM | POA: Diagnosis not present

## 2020-09-05 DIAGNOSIS — F329 Major depressive disorder, single episode, unspecified: Secondary | ICD-10-CM | POA: Diagnosis not present

## 2020-09-05 DIAGNOSIS — E039 Hypothyroidism, unspecified: Secondary | ICD-10-CM | POA: Diagnosis not present

## 2020-09-05 DIAGNOSIS — I872 Venous insufficiency (chronic) (peripheral): Secondary | ICD-10-CM | POA: Diagnosis not present

## 2020-09-05 DIAGNOSIS — Z23 Encounter for immunization: Secondary | ICD-10-CM | POA: Diagnosis not present

## 2020-09-05 DIAGNOSIS — I509 Heart failure, unspecified: Secondary | ICD-10-CM | POA: Diagnosis not present

## 2020-09-05 DIAGNOSIS — E785 Hyperlipidemia, unspecified: Secondary | ICD-10-CM | POA: Diagnosis not present

## 2020-09-14 ENCOUNTER — Other Ambulatory Visit: Payer: Self-pay | Admitting: Internal Medicine

## 2020-09-14 DIAGNOSIS — R5381 Other malaise: Secondary | ICD-10-CM

## 2020-09-28 LAB — COLOGUARD

## 2020-10-03 DIAGNOSIS — Z1212 Encounter for screening for malignant neoplasm of rectum: Secondary | ICD-10-CM | POA: Diagnosis not present

## 2020-10-03 DIAGNOSIS — Z1211 Encounter for screening for malignant neoplasm of colon: Secondary | ICD-10-CM | POA: Diagnosis not present

## 2020-10-12 DIAGNOSIS — M1712 Unilateral primary osteoarthritis, left knee: Secondary | ICD-10-CM | POA: Diagnosis not present

## 2020-10-12 DIAGNOSIS — M1711 Unilateral primary osteoarthritis, right knee: Secondary | ICD-10-CM | POA: Diagnosis not present

## 2020-10-17 LAB — COLOGUARD: COLOGUARD: NEGATIVE

## 2020-10-28 ENCOUNTER — Ambulatory Visit: Payer: Medicare Other | Attending: Internal Medicine

## 2020-10-28 DIAGNOSIS — Z23 Encounter for immunization: Secondary | ICD-10-CM

## 2020-10-28 NOTE — Progress Notes (Signed)
   Covid-19 Vaccination Clinic  Name:  Monica White    MRN: [id number] DOB: [birth year]  10/28/2020  Monica White was observed post Covid-19 immunization for 15 minutes without incident. She was provided with Vaccine Information Sheet and instruction to access the V-Safe system.   Monica White was instructed to call 911 with any severe reactions post vaccine: Marland Kitchen Difficulty breathing  . Swelling of face and throat  . A fast heartbeat  . A bad rash all over body  . Dizziness and weakness   Immunizations Administered    Name Date Dose VIS Date Route   Pfizer COVID-19 Vaccine 10/28/2020  1:38 PM 0.3 mL 09/14/2020 Intramuscular   Manufacturer: [place]   Lot: [id number]   NDC: [address]-9

## 2020-10-31 ENCOUNTER — Ambulatory Visit (INDEPENDENT_AMBULATORY_CARE_PROVIDER_SITE_OTHER): Payer: Medicare Other | Admitting: Internal Medicine

## 2020-10-31 ENCOUNTER — Encounter: Payer: Self-pay | Admitting: *Deleted

## 2020-10-31 ENCOUNTER — Encounter: Payer: Self-pay | Admitting: Internal Medicine

## 2020-10-31 VITALS — BP 94/60 | HR 63 | Ht 67.0 in | Wt 149.8 lb

## 2020-10-31 DIAGNOSIS — R0602 Shortness of breath: Secondary | ICD-10-CM | POA: Diagnosis not present

## 2020-10-31 DIAGNOSIS — E785 Hyperlipidemia, unspecified: Secondary | ICD-10-CM

## 2020-10-31 MED ORDER — ATORVASTATIN CALCIUM 20 MG PO TABS: 20.0000 mg | ORAL_TABLET | Freq: Every day | ORAL | 3 refills | Status: DC

## 2020-10-31 NOTE — Patient Instructions (Addendum)
Medication Instructions:  Your physician has recommended you make the following change in your medication:  1.) stop Crestor 2.) start atorvastatin (Lipitor) 20 mg daily  *If you need a refill on your cardiac medications before your next appointment, please call your pharmacy*   Lab Work: In 8 weeks please return for labs--fasting lipids, ast If you have labs (blood work) drawn today and your tests are completely normal, you will receive your results only by: Marland Kitchen MyChart Message (if you have MyChart) OR . A paper copy in the mail If you have any lab test that is abnormal or we need to change your treatment, we will call you to review the results.   Testing/Procedures: Your physician has requested that you have a lexiscan myoview. For further information please visit [email or web address]. Please follow instruction sheet, as given.   Follow-Up: Follow up with your physician will depend on test results.   Other Instructions

## 2020-10-31 NOTE — Progress Notes (Signed)
Cardiology Office Note   Date:  10/31/2020   ID:  Monica White, DOB 29-May-1941, MRN [id number]  PCP:  Crist Infante, MD  Cardiologist:   Dorris Carnes, MD   Pt presents for f/u of cardiac risk factors    History of Present Illness: Monica White is a 79 y.o. female with a history of Hyperlipidemia and thoracic aortic aneurysm.  Also retinal artery occlusion  Pt initially referred for abnormal echo  In 2018  Reported at 45 to 50% with hypokinesis of the mid/apical anteroseptal wall   Cardiac MRI in March 2019 LVEF calculated at 57%  No hypokinesis  RVEF 53%    Since I saw her she denies CP  But she says she gets more winded with activity   She golfs but cannot walk   Rides   Finds she gets SOB    No PND  No palpitations  Current Meds  Medication Sig  . aspirin 325 MG tablet Take 1 tablet (325 mg total) by mouth daily.  . calcium carbonate (OS-CAL) 600 MG TABS tablet Take 600 mg by mouth at bedtime.   . Cholecalciferol (VITAMIN D PO) Take by mouth.  . Cyanocobalamin (B-12) 1000 MCG CAPS Take by mouth.  . estropipate (ORTHO-EST 0.625) 0.75 MG tablet Take 1 tablet (0.75 mg total) by mouth daily. (Patient taking differently: Take 0.75 mg by mouth at bedtime. )  . levothyroxine (SYNTHROID, LEVOTHROID) 50 MCG tablet Take 1 tablet by mouth daily.  . medroxyPROGESTERone (PROVERA) 2.5 MG tablet Take 1 tablet (2.5 mg total) by mouth daily.  . Multiple Vitamins-Minerals (MULTIVITAMIN PO) Take by mouth.  . rosuvastatin (CRESTOR) 20 MG tablet Take 1 tablet (20 mg total) by mouth daily. Please make overdue appt with Dr. Harrington Challenger before anymore refills. 1st attempt  . zolpidem (AMBIEN) 5 MG tablet Take 1 tablet by mouth as needed.     Allergies:   Patient has no known allergies.   Past Medical History:  Diagnosis Date  . Hypercholesteremia   . Hypothyroid 2015    Past Surgical History:  Procedure Laterality Date  . TUBAL LIGATION  1985     Social History:  The patient  reports that she has  never smoked. She has never used smokeless tobacco. She reports current alcohol use of about 1.0 - 2.0 standard drink of alcohol per week. She reports that she does not use drugs.   Family History:  The patient's family history includes Heart disease in her mother.    ROS:  Please see the history of present illness. All other systems are reviewed and  Negative to the above problem except as noted.    PHYSICAL EXAM: VS:  BP 94/60   Pulse 63   Ht 5\' 7"  (1.702 m)   Wt 149 lb 12.8 oz (67.9 kg)   LMP 01/13/1994   SpO2 95%   BMI 23.46 kg/m   GEN: Well nourished, well developed, in no acute distress  HEENT: normal  Neck: no JVD, Cardiac: RRR; no murmurs, rubs, or gallops,no LE edema  Respiratory:  clear to auscultation bilaterally,  GI: soft, nontender, nondistended, + BS  No hepatomegaly  MS: no deformity Moving all extremities   Skin: warm and dry, no rash Neuro:  Strength and sensation are intact Psych: euthymic mood, full affect   EKG:  EKG is ordered today.   SR 63 bpm  LAFB  LVH with repolarization abnormality    Lipid Panel    Component Value Date/Time   CHOL  169 03/26/2018 0917   TRIG 73 03/26/2018 0917   HDL 64 03/26/2018 0917   CHOLHDL 2.6 03/26/2018 0917   CHOLHDL 3.9 09/20/2017 0607   VLDL 10 09/20/2017 0607   LDLCALC 90 03/26/2018 0917      Wt Readings from Last 3 Encounters:  10/31/20 149 lb 12.8 oz (67.9 kg)  10/13/19 150 lb (68 kg)  09/15/19 150 lb (68 kg)      ASSESSMENT AND PLAN:  1  Dyspnea. ? If this is an angina equivalent  WIll set up for a lexiscan myovue to r/o inducible ischemia  2  Hx Abnormal echo   MRI after showed normal LVEF with normal wall motion     2  HL  Last LDL was 149   She is not tolerating Crestor   Will stop   Give trial for lipitor 20 mg   F/U lipids in 8 wks with liver enzymes   Current medicines are reviewed at length with the patient today.  The patient does not have concerns regarding medicines.  Signed, Dorris Carnes,  MD  10/31/2020 4:23 PM    [address], [place], [place]  [address] Phone: [phone]; Fax: [phone]

## 2020-11-01 ENCOUNTER — Telehealth (HOSPITAL_COMMUNITY): Payer: Self-pay | Admitting: *Deleted

## 2020-11-01 NOTE — Telephone Encounter (Signed)
Patient given detailed instructions per Myocardial Perfusion Study Information Sheet for the test on 11/04/20. Patient notified to arrive 15 minutes early and that it is imperative to arrive on time for appointment to keep from having the test rescheduled.  If you need to cancel or reschedule your appointment, please call the office within 24 hours of your appointment. . Patient verbalized understanding. Kirstie Peri

## 2020-11-02 ENCOUNTER — Other Ambulatory Visit: Payer: Self-pay | Admitting: *Deleted

## 2020-11-02 NOTE — Progress Notes (Unsigned)
Attestation order placed and routed to Dr. Harrington Challenger to be signed.

## 2020-11-03 ENCOUNTER — Other Ambulatory Visit: Payer: Self-pay | Admitting: Internal Medicine

## 2020-11-03 NOTE — Addendum Note (Signed)
Addended by: Rodman Key on: 11/03/2020 12:03 PM   Modules accepted: Orders

## 2020-11-03 NOTE — Addendum Note (Signed)
Addended by: Fay Records on: 11/03/2020 12:17 PM   Modules accepted: Orders

## 2020-11-04 ENCOUNTER — Other Ambulatory Visit: Payer: Self-pay

## 2020-11-04 ENCOUNTER — Ambulatory Visit (HOSPITAL_COMMUNITY): Payer: Medicare Other | Attending: Cardiovascular Disease

## 2020-11-04 DIAGNOSIS — R0602 Shortness of breath: Secondary | ICD-10-CM | POA: Diagnosis not present

## 2020-11-04 DIAGNOSIS — E785 Hyperlipidemia, unspecified: Secondary | ICD-10-CM | POA: Insufficient documentation

## 2020-11-04 LAB — MYOCARDIAL PERFUSION IMAGING
LV dias vol: 67 mL (ref 46–106)
LV sys vol: 17 mL
Peak HR: 78 {beats}/min
Rest HR: 59 {beats}/min
SDS: 1
SRS: 0
SSS: 1
TID: 0.96

## 2020-11-04 MED ORDER — TECHNETIUM TC 99M TETROFOSMIN IV KIT
32.8000 | PACK | Freq: Once | INTRAVENOUS | Status: AC | PRN
  Administered 2020-11-04: 32.8 via INTRAVENOUS
  Filled 2020-11-04: qty 33

## 2020-11-04 MED ORDER — TECHNETIUM TC 99M TETROFOSMIN IV KIT
10.3000 | PACK | Freq: Once | INTRAVENOUS | Status: AC | PRN
  Administered 2020-11-04: 10.3 via INTRAVENOUS
  Filled 2020-11-04: qty 11

## 2020-11-04 MED ORDER — REGADENOSON 0.4 MG/5ML IV SOLN
0.4000 mg | Freq: Once | INTRAVENOUS | Status: AC
  Administered 2020-11-04: 0.4 mg via INTRAVENOUS

## 2020-12-10 ENCOUNTER — Other Ambulatory Visit: Payer: Self-pay | Admitting: Internal Medicine

## 2020-12-10 DIAGNOSIS — E2839 Other primary ovarian failure: Secondary | ICD-10-CM

## 2021-01-06 ENCOUNTER — Other Ambulatory Visit: Payer: Self-pay

## 2021-01-06 ENCOUNTER — Other Ambulatory Visit: Payer: Medicare Other | Admitting: *Deleted

## 2021-01-06 DIAGNOSIS — R0602 Shortness of breath: Secondary | ICD-10-CM | POA: Diagnosis not present

## 2021-01-06 DIAGNOSIS — E785 Hyperlipidemia, unspecified: Secondary | ICD-10-CM

## 2021-01-06 LAB — LIPID PANEL
Chol/HDL Ratio: 3.6 ratio (ref 0.0–4.4)
Cholesterol, Total: 204 mg/dL — ABNORMAL HIGH (ref 100–199)
HDL: 57 mg/dL
LDL Chol Calc (NIH): 129 mg/dL — ABNORMAL HIGH (ref 0–99)
Triglycerides: 102 mg/dL (ref 0–149)
VLDL Cholesterol Cal: 18 mg/dL (ref 5–40)

## 2021-01-06 LAB — AST: AST: 19 IU/L (ref 0–40)

## 2021-01-09 DIAGNOSIS — Z85828 Personal history of other malignant neoplasm of skin: Secondary | ICD-10-CM | POA: Diagnosis not present

## 2021-01-09 DIAGNOSIS — L821 Other seborrheic keratosis: Secondary | ICD-10-CM | POA: Diagnosis not present

## 2021-01-09 DIAGNOSIS — L570 Actinic keratosis: Secondary | ICD-10-CM | POA: Diagnosis not present

## 2021-01-13 DIAGNOSIS — H353121 Nonexudative age-related macular degeneration, left eye, early dry stage: Secondary | ICD-10-CM | POA: Diagnosis not present

## 2021-01-13 DIAGNOSIS — H349 Unspecified retinal vascular occlusion: Secondary | ICD-10-CM | POA: Diagnosis not present

## 2021-01-13 DIAGNOSIS — H2513 Age-related nuclear cataract, bilateral: Secondary | ICD-10-CM | POA: Diagnosis not present

## 2021-01-13 DIAGNOSIS — H52203 Unspecified astigmatism, bilateral: Secondary | ICD-10-CM | POA: Diagnosis not present

## 2021-02-20 DIAGNOSIS — M545 Low back pain, unspecified: Secondary | ICD-10-CM | POA: Diagnosis not present

## 2021-02-20 DIAGNOSIS — M1711 Unilateral primary osteoarthritis, right knee: Secondary | ICD-10-CM | POA: Diagnosis not present

## 2021-02-20 DIAGNOSIS — M4126 Other idiopathic scoliosis, lumbar region: Secondary | ICD-10-CM | POA: Diagnosis not present

## 2021-02-20 DIAGNOSIS — M1712 Unilateral primary osteoarthritis, left knee: Secondary | ICD-10-CM | POA: Diagnosis not present

## 2021-02-21 DIAGNOSIS — M6281 Muscle weakness (generalized): Secondary | ICD-10-CM | POA: Diagnosis not present

## 2021-02-21 DIAGNOSIS — M6289 Other specified disorders of muscle: Secondary | ICD-10-CM | POA: Diagnosis not present

## 2021-03-22 DIAGNOSIS — M6281 Muscle weakness (generalized): Secondary | ICD-10-CM | POA: Diagnosis not present

## 2021-03-22 DIAGNOSIS — M6289 Other specified disorders of muscle: Secondary | ICD-10-CM | POA: Diagnosis not present

## 2021-04-03 DIAGNOSIS — M6289 Other specified disorders of muscle: Secondary | ICD-10-CM | POA: Diagnosis not present

## 2021-04-03 DIAGNOSIS — M6281 Muscle weakness (generalized): Secondary | ICD-10-CM | POA: Diagnosis not present

## 2021-04-10 DIAGNOSIS — M6281 Muscle weakness (generalized): Secondary | ICD-10-CM | POA: Diagnosis not present

## 2021-04-10 DIAGNOSIS — M6289 Other specified disorders of muscle: Secondary | ICD-10-CM | POA: Diagnosis not present

## 2021-04-17 DIAGNOSIS — E559 Vitamin D deficiency, unspecified: Secondary | ICD-10-CM | POA: Diagnosis not present

## 2021-04-17 DIAGNOSIS — E785 Hyperlipidemia, unspecified: Secondary | ICD-10-CM | POA: Diagnosis not present

## 2021-04-17 DIAGNOSIS — E039 Hypothyroidism, unspecified: Secondary | ICD-10-CM | POA: Diagnosis not present

## 2021-04-17 DIAGNOSIS — M6281 Muscle weakness (generalized): Secondary | ICD-10-CM | POA: Diagnosis not present

## 2021-04-17 DIAGNOSIS — E538 Deficiency of other specified B group vitamins: Secondary | ICD-10-CM | POA: Diagnosis not present

## 2021-04-17 DIAGNOSIS — M6289 Other specified disorders of muscle: Secondary | ICD-10-CM | POA: Diagnosis not present

## 2021-04-20 DIAGNOSIS — M6281 Muscle weakness (generalized): Secondary | ICD-10-CM | POA: Diagnosis not present

## 2021-04-20 DIAGNOSIS — M6289 Other specified disorders of muscle: Secondary | ICD-10-CM | POA: Diagnosis not present

## 2021-04-21 IMAGING — DX DG KNEE AP/LAT W/ SUNRISE*R*
3 series · 3 of 3 positions shown · non-contrast
Comparison: None.

CLINICAL DATA: Chronic right knee pain

EXAM:
RIGHT KNEE 3 VIEWS

[dg knee ap/lat w/ sunrise right (1 of 3)]
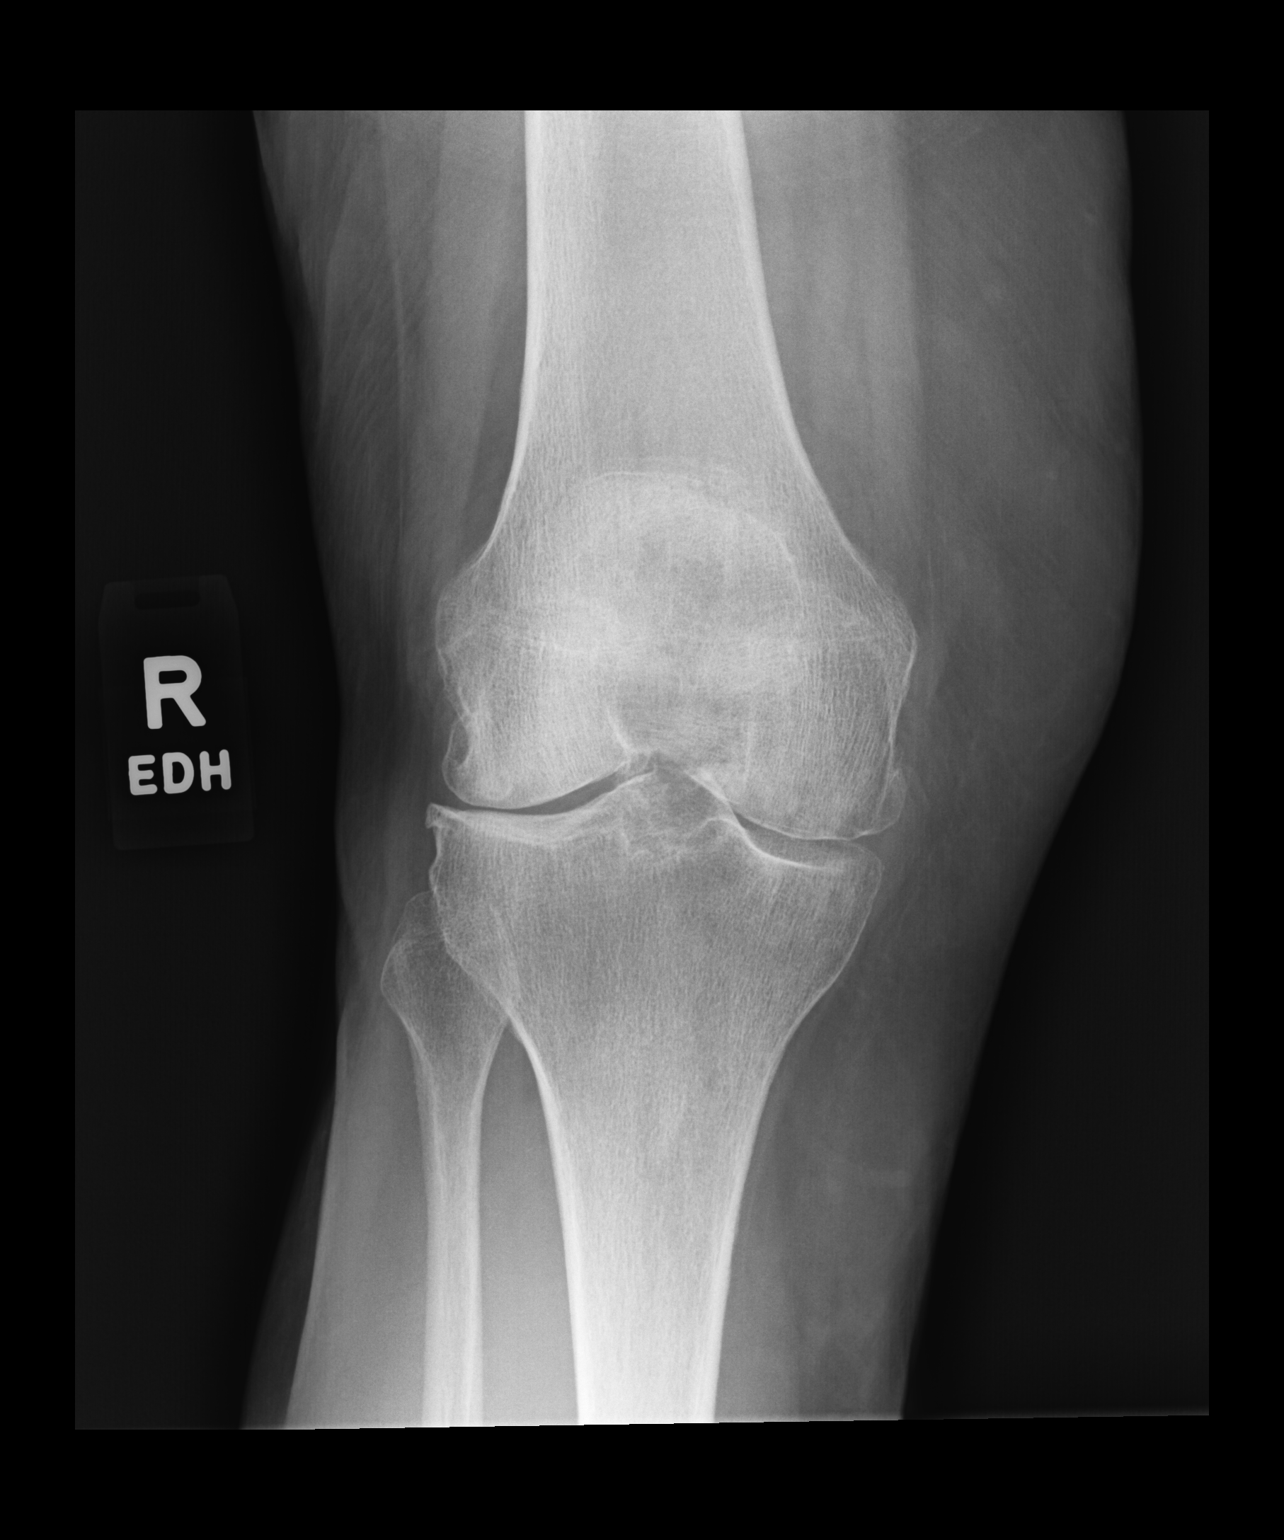

[dg knee ap/lat w/ sunrise right (2 of 3)]
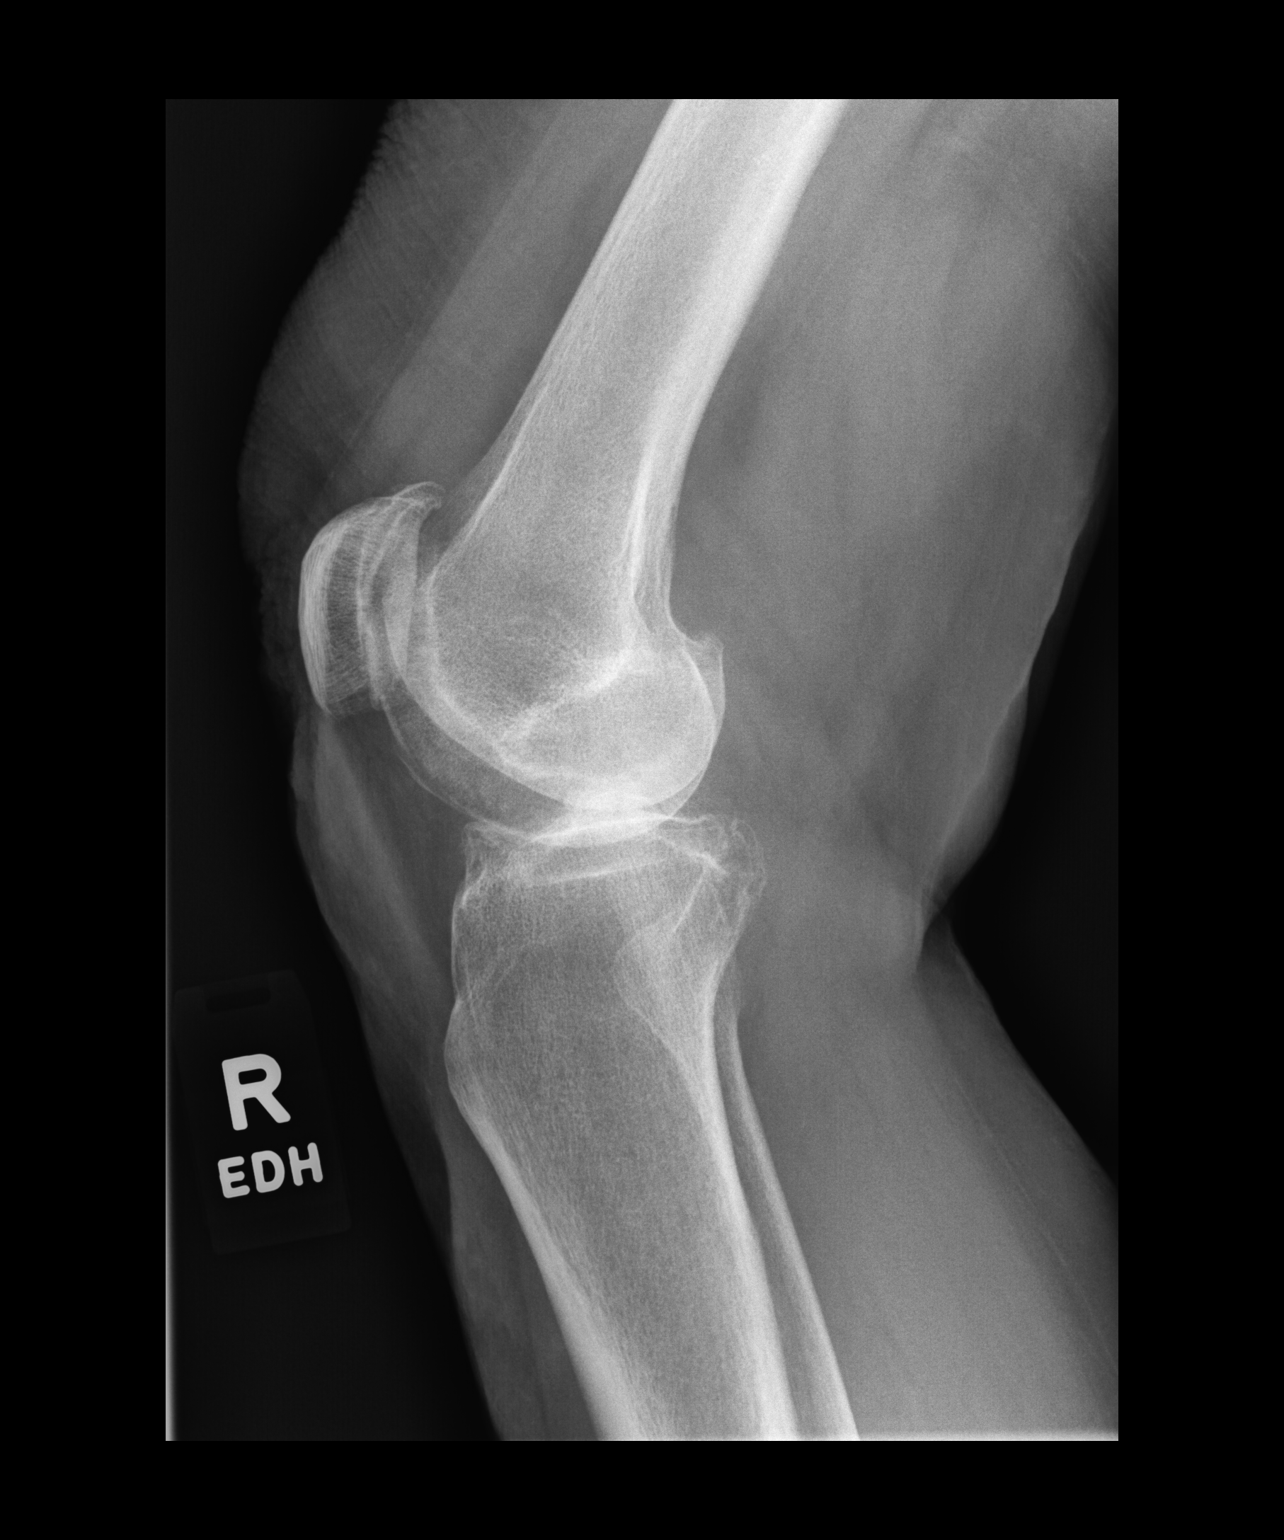

[dg knee ap/lat w/ sunrise right (3 of 3)]
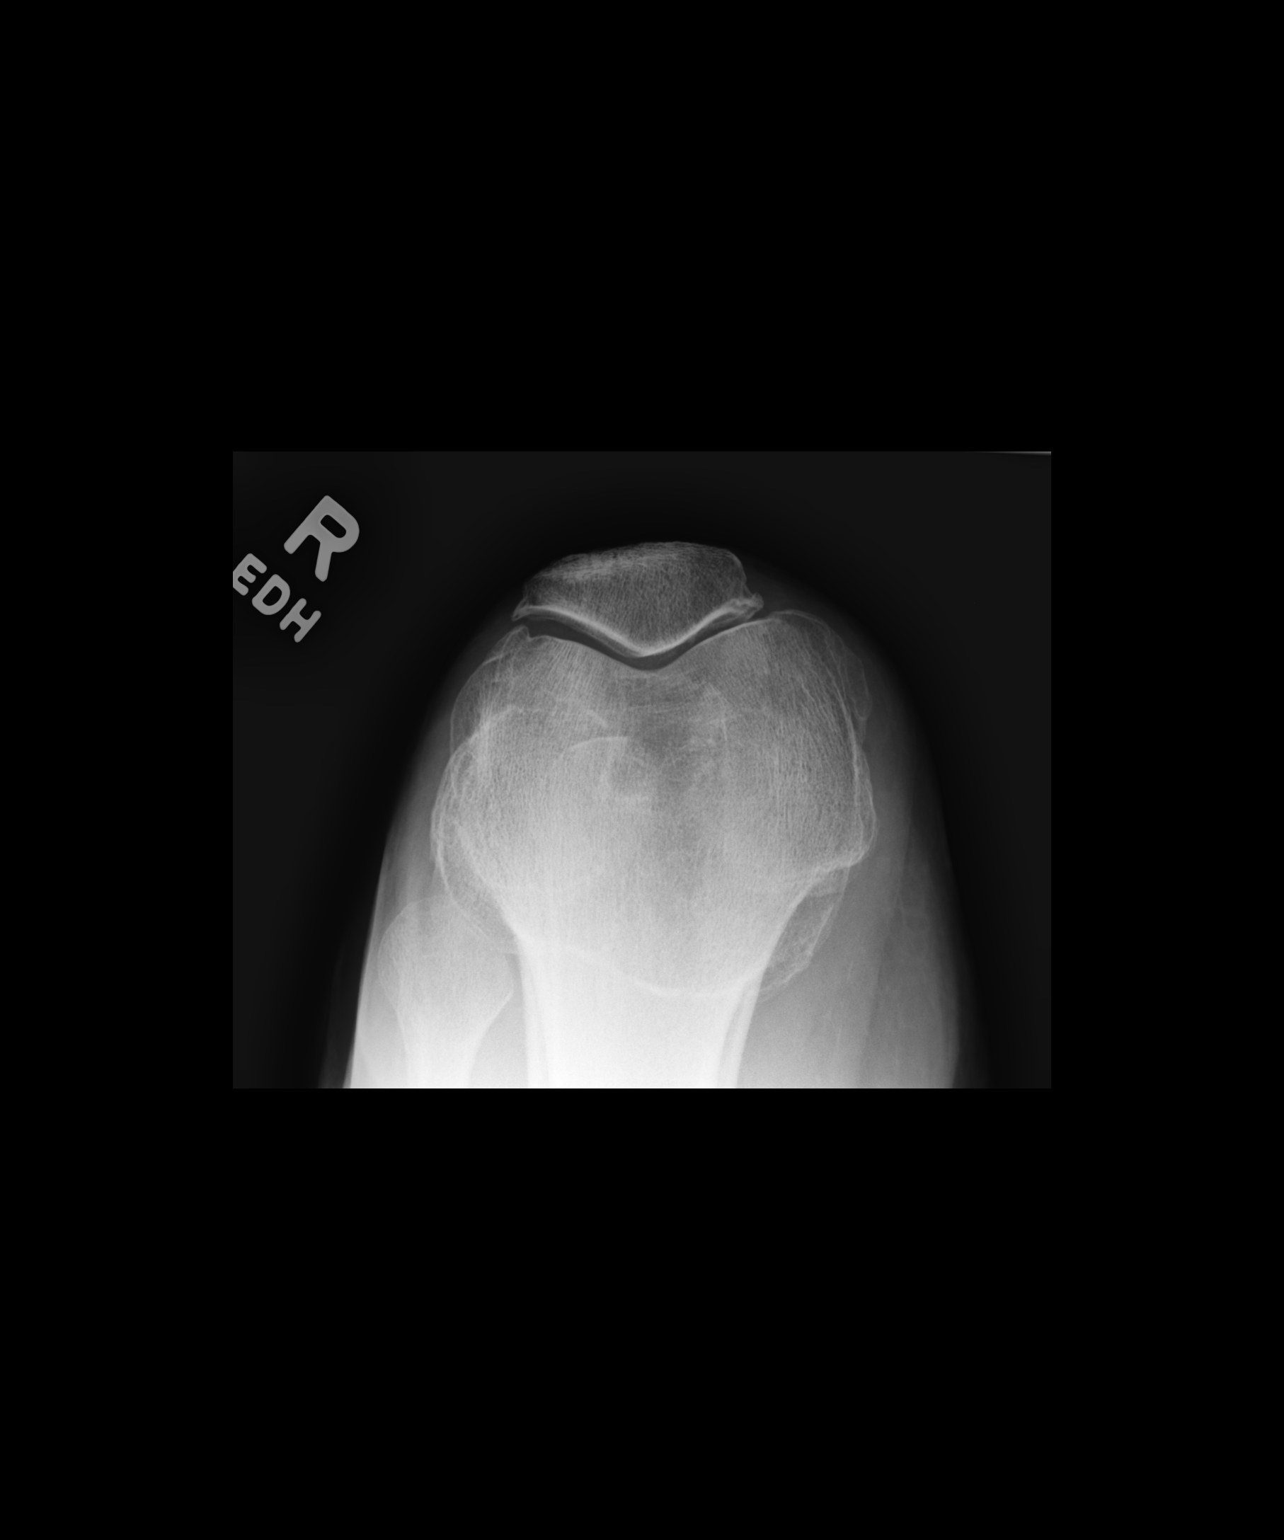

[3 of 3 positions shown; findings below may reference images not displayed]

FINDINGS: Tricompartmental degenerative change with joint space narrowing and
spurring most prominent in the lateral joint compartment. No
fracture or joint effusion.
IMPRESSION: Moderate tricompartmental degenerative change.

## 2021-04-25 DIAGNOSIS — M6289 Other specified disorders of muscle: Secondary | ICD-10-CM | POA: Diagnosis not present

## 2021-04-25 DIAGNOSIS — M6281 Muscle weakness (generalized): Secondary | ICD-10-CM | POA: Diagnosis not present

## 2021-04-26 DIAGNOSIS — Z1152 Encounter for screening for COVID-19: Secondary | ICD-10-CM | POA: Diagnosis not present

## 2021-04-26 DIAGNOSIS — B349 Viral infection, unspecified: Secondary | ICD-10-CM | POA: Diagnosis not present

## 2021-04-26 DIAGNOSIS — Z20828 Contact with and (suspected) exposure to other viral communicable diseases: Secondary | ICD-10-CM | POA: Diagnosis not present

## 2021-04-26 DIAGNOSIS — J029 Acute pharyngitis, unspecified: Secondary | ICD-10-CM | POA: Diagnosis not present

## 2021-05-02 DIAGNOSIS — M6281 Muscle weakness (generalized): Secondary | ICD-10-CM | POA: Diagnosis not present

## 2021-05-02 DIAGNOSIS — M6289 Other specified disorders of muscle: Secondary | ICD-10-CM | POA: Diagnosis not present

## 2021-05-04 DIAGNOSIS — M6289 Other specified disorders of muscle: Secondary | ICD-10-CM | POA: Diagnosis not present

## 2021-05-04 DIAGNOSIS — M6281 Muscle weakness (generalized): Secondary | ICD-10-CM | POA: Diagnosis not present

## 2021-05-09 DIAGNOSIS — M6289 Other specified disorders of muscle: Secondary | ICD-10-CM | POA: Diagnosis not present

## 2021-05-09 DIAGNOSIS — M6281 Muscle weakness (generalized): Secondary | ICD-10-CM | POA: Diagnosis not present

## 2021-05-10 DIAGNOSIS — M170 Bilateral primary osteoarthritis of knee: Secondary | ICD-10-CM | POA: Diagnosis not present

## 2021-05-10 DIAGNOSIS — M1712 Unilateral primary osteoarthritis, left knee: Secondary | ICD-10-CM | POA: Diagnosis not present

## 2021-05-10 DIAGNOSIS — M1711 Unilateral primary osteoarthritis, right knee: Secondary | ICD-10-CM | POA: Diagnosis not present

## 2021-05-12 DIAGNOSIS — I509 Heart failure, unspecified: Secondary | ICD-10-CM | POA: Diagnosis not present

## 2021-05-12 DIAGNOSIS — R9431 Abnormal electrocardiogram [ECG] [EKG]: Secondary | ICD-10-CM | POA: Diagnosis not present

## 2021-05-12 DIAGNOSIS — E538 Deficiency of other specified B group vitamins: Secondary | ICD-10-CM | POA: Diagnosis not present

## 2021-05-12 DIAGNOSIS — I700 Atherosclerosis of aorta: Secondary | ICD-10-CM | POA: Diagnosis not present

## 2021-05-12 DIAGNOSIS — E785 Hyperlipidemia, unspecified: Secondary | ICD-10-CM | POA: Diagnosis not present

## 2021-05-12 DIAGNOSIS — I712 Thoracic aortic aneurysm, without rupture: Secondary | ICD-10-CM | POA: Diagnosis not present

## 2021-05-12 DIAGNOSIS — M25561 Pain in right knee: Secondary | ICD-10-CM | POA: Diagnosis not present

## 2021-05-12 DIAGNOSIS — E039 Hypothyroidism, unspecified: Secondary | ICD-10-CM | POA: Diagnosis not present

## 2021-05-12 DIAGNOSIS — F329 Major depressive disorder, single episode, unspecified: Secondary | ICD-10-CM | POA: Diagnosis not present

## 2021-05-12 DIAGNOSIS — Z1331 Encounter for screening for depression: Secondary | ICD-10-CM | POA: Diagnosis not present

## 2021-05-12 DIAGNOSIS — R82998 Other abnormal findings in urine: Secondary | ICD-10-CM | POA: Diagnosis not present

## 2021-05-17 DIAGNOSIS — M6289 Other specified disorders of muscle: Secondary | ICD-10-CM | POA: Diagnosis not present

## 2021-05-17 DIAGNOSIS — M6281 Muscle weakness (generalized): Secondary | ICD-10-CM | POA: Diagnosis not present

## 2021-05-19 DIAGNOSIS — M6289 Other specified disorders of muscle: Secondary | ICD-10-CM | POA: Diagnosis not present

## 2021-05-19 DIAGNOSIS — M6281 Muscle weakness (generalized): Secondary | ICD-10-CM | POA: Diagnosis not present

## 2021-05-24 ENCOUNTER — Other Ambulatory Visit: Payer: Medicare Other

## 2021-05-31 DIAGNOSIS — M6281 Muscle weakness (generalized): Secondary | ICD-10-CM | POA: Diagnosis not present

## 2021-05-31 DIAGNOSIS — M6289 Other specified disorders of muscle: Secondary | ICD-10-CM | POA: Diagnosis not present

## 2021-06-07 DIAGNOSIS — M6289 Other specified disorders of muscle: Secondary | ICD-10-CM | POA: Diagnosis not present

## 2021-06-07 DIAGNOSIS — M6281 Muscle weakness (generalized): Secondary | ICD-10-CM | POA: Diagnosis not present

## 2021-06-09 DIAGNOSIS — M6281 Muscle weakness (generalized): Secondary | ICD-10-CM | POA: Diagnosis not present

## 2021-06-09 DIAGNOSIS — M6289 Other specified disorders of muscle: Secondary | ICD-10-CM | POA: Diagnosis not present

## 2021-06-22 DIAGNOSIS — M6289 Other specified disorders of muscle: Secondary | ICD-10-CM | POA: Diagnosis not present

## 2021-06-22 DIAGNOSIS — M6281 Muscle weakness (generalized): Secondary | ICD-10-CM | POA: Diagnosis not present

## 2021-06-28 DIAGNOSIS — M6281 Muscle weakness (generalized): Secondary | ICD-10-CM | POA: Diagnosis not present

## 2021-06-28 DIAGNOSIS — M6289 Other specified disorders of muscle: Secondary | ICD-10-CM | POA: Diagnosis not present

## 2021-07-05 DIAGNOSIS — M6281 Muscle weakness (generalized): Secondary | ICD-10-CM | POA: Diagnosis not present

## 2021-07-05 DIAGNOSIS — M6289 Other specified disorders of muscle: Secondary | ICD-10-CM | POA: Diagnosis not present

## 2021-07-07 DIAGNOSIS — M6281 Muscle weakness (generalized): Secondary | ICD-10-CM | POA: Diagnosis not present

## 2021-07-07 DIAGNOSIS — M6289 Other specified disorders of muscle: Secondary | ICD-10-CM | POA: Diagnosis not present

## 2021-07-12 DIAGNOSIS — M6289 Other specified disorders of muscle: Secondary | ICD-10-CM | POA: Diagnosis not present

## 2021-07-12 DIAGNOSIS — M6281 Muscle weakness (generalized): Secondary | ICD-10-CM | POA: Diagnosis not present

## 2021-07-14 DIAGNOSIS — M6281 Muscle weakness (generalized): Secondary | ICD-10-CM | POA: Diagnosis not present

## 2021-07-14 DIAGNOSIS — M6289 Other specified disorders of muscle: Secondary | ICD-10-CM | POA: Diagnosis not present

## 2021-07-19 DIAGNOSIS — M6289 Other specified disorders of muscle: Secondary | ICD-10-CM | POA: Diagnosis not present

## 2021-07-19 DIAGNOSIS — M6281 Muscle weakness (generalized): Secondary | ICD-10-CM | POA: Diagnosis not present

## 2021-07-21 DIAGNOSIS — M6289 Other specified disorders of muscle: Secondary | ICD-10-CM | POA: Diagnosis not present

## 2021-07-21 DIAGNOSIS — M6281 Muscle weakness (generalized): Secondary | ICD-10-CM | POA: Diagnosis not present

## 2021-07-24 ENCOUNTER — Telehealth: Payer: Self-pay | Admitting: Internal Medicine

## 2021-07-24 DIAGNOSIS — R0602 Shortness of breath: Secondary | ICD-10-CM

## 2021-07-24 NOTE — Telephone Encounter (Signed)
Pt c/o Shortness Of Breath: STAT if SOB developed within the last 24 hours or pt is noticeably SOB on the phone  1. Are you currently SOB (can you hear that pt is SOB on the phone)?   2. How long have you been experiencing SOB? A while.   3. Are you SOB when sitting or when up moving around? Up and moving around   4. Are you currently experiencing any other symptoms? No   Patient noticed the SOB when she started taking care of her Husband while he was ill. She has started exercising after he passed away and it is not getting any better. She has been putting it off because she was taking care of him, but now she needs to start taking care of herself.  She even mentioned this to Dr. Harrington Challenger at her last appointment and her stress test came back normal. She is not sure what to do

## 2021-07-24 NOTE — Telephone Encounter (Signed)
Patient had stress test last December which was normal.  Pt is still SOB with activity. Walking to her bedroom as well as being on the golf course.  She just mostly has to ride in the cart.  Was out in heat today.  Will plan to play in cooler temps/times of day.  Her last echo was 2017-03-04.  Her husband passed away last month after she took care of him throughout his illness.  She has been exercising for the last month and working with physical therapy but not noticing any improvement.  She has appointment with [facility] on 08/29/21.  Pt aware I will route to dr. Harrington Challenger and if she has recommendations for prior to her next visit we will call pt back.

## 2021-07-26 DIAGNOSIS — M6281 Muscle weakness (generalized): Secondary | ICD-10-CM | POA: Diagnosis not present

## 2021-07-26 DIAGNOSIS — M6289 Other specified disorders of muscle: Secondary | ICD-10-CM | POA: Diagnosis not present

## 2021-07-26 NOTE — Telephone Encounter (Signed)
Echo order placed per Dr Harrington Challenger. Labs from 6/17 (CMET, CBC, TSH, LIPIDS) from PCP are in [place].  Pt is scheduled with APP 09/02/21.  Will move up if availability with Dr. Harrington Challenger.

## 2021-07-26 NOTE — Telephone Encounter (Signed)
Set up an echo   SOB  Evaluate systolic / diastolic function  She had a stress test last year  Make sure that thre are current labs from [facility] (from last year--  CBC, BMET, TSH, lipids)  Can she follow up with me after?  (Set 1/2 day?)

## 2021-08-09 DIAGNOSIS — M6281 Muscle weakness (generalized): Secondary | ICD-10-CM | POA: Diagnosis not present

## 2021-08-09 DIAGNOSIS — M6289 Other specified disorders of muscle: Secondary | ICD-10-CM | POA: Diagnosis not present

## 2021-08-11 DIAGNOSIS — M1711 Unilateral primary osteoarthritis, right knee: Secondary | ICD-10-CM | POA: Diagnosis not present

## 2021-08-11 DIAGNOSIS — M1712 Unilateral primary osteoarthritis, left knee: Secondary | ICD-10-CM | POA: Diagnosis not present

## 2021-08-11 DIAGNOSIS — M6281 Muscle weakness (generalized): Secondary | ICD-10-CM | POA: Diagnosis not present

## 2021-08-11 DIAGNOSIS — M6289 Other specified disorders of muscle: Secondary | ICD-10-CM | POA: Diagnosis not present

## 2021-08-16 DIAGNOSIS — M6289 Other specified disorders of muscle: Secondary | ICD-10-CM | POA: Diagnosis not present

## 2021-08-16 DIAGNOSIS — M6281 Muscle weakness (generalized): Secondary | ICD-10-CM | POA: Diagnosis not present

## 2021-08-18 DIAGNOSIS — M6281 Muscle weakness (generalized): Secondary | ICD-10-CM | POA: Diagnosis not present

## 2021-08-18 DIAGNOSIS — M6289 Other specified disorders of muscle: Secondary | ICD-10-CM | POA: Diagnosis not present

## 2021-08-22 DIAGNOSIS — R0981 Nasal congestion: Secondary | ICD-10-CM | POA: Diagnosis not present

## 2021-08-22 DIAGNOSIS — R5383 Other fatigue: Secondary | ICD-10-CM | POA: Diagnosis not present

## 2021-08-22 DIAGNOSIS — M6289 Other specified disorders of muscle: Secondary | ICD-10-CM | POA: Diagnosis not present

## 2021-08-22 DIAGNOSIS — M6281 Muscle weakness (generalized): Secondary | ICD-10-CM | POA: Diagnosis not present

## 2021-08-22 DIAGNOSIS — I712 Thoracic aortic aneurysm, without rupture: Secondary | ICD-10-CM | POA: Diagnosis not present

## 2021-08-22 DIAGNOSIS — R051 Acute cough: Secondary | ICD-10-CM | POA: Diagnosis not present

## 2021-08-22 DIAGNOSIS — Z1152 Encounter for screening for COVID-19: Secondary | ICD-10-CM | POA: Diagnosis not present

## 2021-08-24 ENCOUNTER — Other Ambulatory Visit: Payer: Self-pay

## 2021-08-24 ENCOUNTER — Ambulatory Visit (HOSPITAL_COMMUNITY): Payer: Medicare Other | Attending: Internal Medicine

## 2021-08-24 ENCOUNTER — Telehealth: Payer: Self-pay | Admitting: *Deleted

## 2021-08-24 DIAGNOSIS — R0602 Shortness of breath: Secondary | ICD-10-CM

## 2021-08-24 LAB — ECHOCARDIOGRAM COMPLETE
Area-P 1/2: 3.83 cm2
P 1/2 time: 888 msec
S' Lateral: 3.3 cm

## 2021-08-24 NOTE — Telephone Encounter (Signed)
Patient has been notified of echo results. She voices understanding and would like to investigate with pulmonary if it is her lungs causing SOB.  Her husband saw Dr. Valeta Harms (he passed away in 06-30-23)  She is leaving for a [place] 10[place]6 - 10/16.  She would be willing to walk on treadmill if needed.  Had nuc study in Dec 21 but it was lexi. Wants pulmonary referral if Dr. Harrington Challenger agrees Wants to know if ok to cancel APP visit 10/5 for now w [facility] [place] I will forward to Dr. Harrington Challenger and will call her back with update/recommendations.

## 2021-08-24 NOTE — Telephone Encounter (Signed)
-----   Message from Fay Records, MD sent at 08/24/2021  3:29 PM EDT ----- Pumping function of there left and Right ventricles is normal There is some mild relaxation abnormality that is not uncommon at her age There is mod tricuspid regurgitation Nothing to explain symptoms Can she walk on treadmill   WOuld like to follow her exercises capacity, even modified bruce protocol?

## 2021-08-25 DIAGNOSIS — M6281 Muscle weakness (generalized): Secondary | ICD-10-CM | POA: Diagnosis not present

## 2021-08-25 DIAGNOSIS — M6289 Other specified disorders of muscle: Secondary | ICD-10-CM | POA: Diagnosis not present

## 2021-08-28 NOTE — Telephone Encounter (Signed)
PLease refer to pulmonary. Cancel appt from 10/5

## 2021-08-29 ENCOUNTER — Ambulatory Visit (HOSPITAL_BASED_OUTPATIENT_CLINIC_OR_DEPARTMENT_OTHER): Payer: Medicare Other | Admitting: Family

## 2021-08-29 NOTE — Telephone Encounter (Signed)
Called patient and informed.  Cancelled her appointment this afternoon w APP and placed referral to pulmonary.  Pt is grateful for assistance.

## 2021-09-13 DIAGNOSIS — M6289 Other specified disorders of muscle: Secondary | ICD-10-CM | POA: Diagnosis not present

## 2021-09-13 DIAGNOSIS — M6281 Muscle weakness (generalized): Secondary | ICD-10-CM | POA: Diagnosis not present

## 2021-09-18 ENCOUNTER — Encounter: Payer: Self-pay | Admitting: Pulmonary Disease

## 2021-09-18 ENCOUNTER — Other Ambulatory Visit: Payer: Self-pay

## 2021-09-18 ENCOUNTER — Ambulatory Visit (INDEPENDENT_AMBULATORY_CARE_PROVIDER_SITE_OTHER): Payer: Medicare Other | Admitting: Pulmonary Disease

## 2021-09-18 VITALS — BP 124/76 | HR 81 | Temp 97.8°F | Ht 67.0 in | Wt 151.8 lb

## 2021-09-18 DIAGNOSIS — F419 Anxiety disorder, unspecified: Secondary | ICD-10-CM | POA: Diagnosis not present

## 2021-09-18 DIAGNOSIS — F410 Panic disorder [episodic paroxysmal anxiety] without agoraphobia: Secondary | ICD-10-CM | POA: Diagnosis not present

## 2021-09-18 DIAGNOSIS — Z8709 Personal history of other diseases of the respiratory system: Secondary | ICD-10-CM | POA: Diagnosis not present

## 2021-09-18 DIAGNOSIS — R0602 Shortness of breath: Secondary | ICD-10-CM | POA: Diagnosis not present

## 2021-09-18 DIAGNOSIS — R0609 Other forms of dyspnea: Secondary | ICD-10-CM | POA: Diagnosis not present

## 2021-09-18 DIAGNOSIS — Z23 Encounter for immunization: Secondary | ICD-10-CM | POA: Diagnosis not present

## 2021-09-18 NOTE — Patient Instructions (Signed)
Thank you for visiting Dr. Valeta Harms at [facility]. Today we recommend the following:  Orders Placed This Encounter  Procedures   Pulmonary Function Test   PFTs prior to next visit Can use albuterol as need and before strenuous exercise Start use of spacer   Return in about 4 weeks (around 10/16/2021) for with APP or Dr. Valeta Harms.    Please do your part to reduce the spread of COVID-19.

## 2021-09-18 NOTE — Progress Notes (Signed)
Synopsis: Referred in Oct 2020 for SOB by Fay Records, MD  Subjective:   PATIENT ID: Monica White GENDER: female DOB: 15-Oct-1941, MRN: [id number]  Chief Complaint  Patient presents with   Consult    Pt. Wants to talk about shortness of breath. Pt. Says it's gotten worse.    PMH high cholesterol, hypothyroid, referred for SOB. Life long non-smoker. Unfortunately husband passed this past July (my former patient Mosetta Putt). She notices that she feels SOB when she gets up and goes to do simple tasks. She has been playing golf and feels short of breath. Playing golf was the first time she noticed it over a year ago. Dealing with her hisband she ignored her symptoms. She went on cruise with family this year and felt SOB and DOE with exertion. Recently saw stress test and echo which were reassuring.    Past Medical History:  Diagnosis Date   Hypercholesteremia    Hypothyroid 2015     Family History  Problem Relation Age of Onset   Heart disease Mother      Past Surgical History:  Procedure Laterality Date   TUBAL LIGATION  1985    Social History   Socioeconomic History   Marital status: Married    Spouse name: Not on file   Number of children: Not on file   Years of education: Not on file   Highest education level: Not on file  Occupational History   Not on file  Tobacco Use   Smoking status: Never   Smokeless tobacco: Never  Vaping Use   Vaping Use: Never used  Substance and Sexual Activity   Alcohol use: Yes    Alcohol/week: 1.0 - 2.0 standard drink    Types: 1 - 2 Standard drinks or equivalent per week   Drug use: No   Sexual activity: Yes    Partners: Male    Birth control/protection: Post-menopausal, Surgical    Comment: Tubal  Other Topics Concern   Not on file  Social History Narrative   Not on file   Social Determinants of Health   Financial Resource Strain: Not on file  Food Insecurity: Not on file  Transportation Needs: Not on file  Physical Activity:  Not on file  Stress: Not on file  Social Connections: Not on file  Intimate Partner Violence: Not on file     No Known Allergies   Outpatient Medications Prior to Visit  Medication Sig Dispense Refill   aspirin 325 MG tablet Take 1 tablet (325 mg total) by mouth daily. 30 tablet 3   atorvastatin (LIPITOR) 20 MG tablet Take 1 tablet (20 mg total) by mouth daily. 90 tablet 3   calcium carbonate (OS-CAL) 600 MG TABS tablet Take 600 mg by mouth at bedtime.      Cholecalciferol (VITAMIN D PO) Take by mouth.     Cyanocobalamin (B-12) 1000 MCG CAPS Take by mouth.     estropipate (ORTHO-EST 0.625) 0.75 MG tablet Take 1 tablet (0.75 mg total) by mouth daily. (Patient taking differently: Take 0.75 mg by mouth at bedtime.) 16 tablet 0   levothyroxine (SYNTHROID, LEVOTHROID) 50 MCG tablet Take 1 tablet by mouth daily.  11   medroxyPROGESTERone (PROVERA) 2.5 MG tablet Take 1 tablet (2.5 mg total) by mouth daily. 90 tablet 3   Multiple Vitamins-Minerals (MULTIVITAMIN PO) Take by mouth.     zolpidem (AMBIEN) 5 MG tablet Take 1 tablet by mouth as needed.     No facility-administered medications prior to  visit.    Review of Systems  Constitutional:  Negative for chills, fever, malaise/fatigue and weight loss.  HENT:  Negative for hearing loss, sore throat and tinnitus.   Eyes:  Negative for blurred vision and double vision.  Respiratory:  Positive for shortness of breath. Negative for cough, hemoptysis, sputum production, wheezing and stridor.   Cardiovascular:  Negative for chest pain, palpitations, orthopnea, leg swelling and PND.  Gastrointestinal:  Negative for abdominal pain, constipation, diarrhea, heartburn, nausea and vomiting.  Genitourinary:  Negative for dysuria, hematuria and urgency.  Musculoskeletal:  Negative for joint pain and myalgias.  Skin:  Negative for itching and rash.  Neurological:  Negative for dizziness, tingling, weakness and headaches.  Endo/Heme/Allergies:  Negative for  environmental allergies. Does not bruise/bleed easily.  Psychiatric/Behavioral:  Negative for depression. The patient is not nervous/anxious and does not have insomnia.   All other systems reviewed and are negative.   Objective:  Physical Exam Vitals reviewed.  Constitutional:      General: She is not in acute distress.    Appearance: She is well-developed.  HENT:     Head: Normocephalic and atraumatic.  Eyes:     General: No scleral icterus.    Conjunctiva/sclera: Conjunctivae normal.     Pupils: Pupils are equal, round, and reactive to light.  Neck:     Vascular: No JVD.     Trachea: No tracheal deviation.  Cardiovascular:     Rate and Rhythm: Normal rate and regular rhythm.     Heart sounds: Normal heart sounds. No murmur heard. Pulmonary:     Effort: Pulmonary effort is normal. No tachypnea, accessory muscle usage or respiratory distress.     Breath sounds: No stridor. No wheezing, rhonchi or rales.  Abdominal:     General: There is no distension.     Palpations: Abdomen is soft.     Tenderness: There is no abdominal tenderness.  Musculoskeletal:        General: No tenderness.     Cervical back: Neck supple.     Right lower leg: No edema.     Left lower leg: No edema.  Lymphadenopathy:     Cervical: No cervical adenopathy.  Skin:    General: Skin is warm and dry.     Capillary Refill: Capillary refill takes less than 2 seconds.     Findings: No rash.  Neurological:     Mental Status: She is alert and oriented to person, place, and time.  Psychiatric:        Behavior: Behavior normal.     Vitals:   09/18/21 1522  BP: 124/76  Pulse: 81  Temp: 97.8 F (36.6 C)  TempSrc: Oral  SpO2: 99%  Weight: 151 lb 12.8 oz (68.9 kg)  Height: 5\' 7"  (1.702 m)   99% on RA BMI Readings from Last 3 Encounters:  09/18/21 23.78 kg/m  11/04/20 23.34 kg/m  10/31/20 23.46 kg/m   Wt Readings from Last 3 Encounters:  09/18/21 151 lb 12.8 oz (68.9 kg)  11/04/20 149 lb  (67.6 kg)  10/31/20 149 lb 12.8 oz (67.9 kg)     CBC    Component Value Date/Time   WBC 6.6 09/19/2017 1820   RBC 4.27 09/19/2017 1820   HGB 13.6 09/19/2017 1829   HCT 40.0 09/19/2017 1829   PLT 276 09/19/2017 1820   MCV 94.4 09/19/2017 1820   MCH 31.9 09/19/2017 1820   MCHC 33.7 09/19/2017 1820   RDW 13.0 09/19/2017 1820   LYMPHSABS  2.1 09/19/2017 1820   MONOABS 0.4 09/19/2017 1820   EOSABS 0.3 09/19/2017 1820   BASOSABS 0.1 09/19/2017 1820    Chest Imaging: 10/09/2018 CT chest: Lung parenchyma appears relatively normal on imaging. Does have a hiatal hernia at the time. The patient's images have been independently reviewed by me.    Pulmonary Functions Testing Results: No flowsheet data found.  FeNO:   Pathology:   Echocardiogram:   08/24/2021: IMPRESSIONS   1. Left ventricular ejection fraction, by estimation, is 60 to 65%. The  left ventricle has normal function. The left ventricle has no regional  wall motion abnormalities. Left ventricular diastolic parameters are  consistent with Grade I diastolic  dysfunction (impaired relaxation). The average left ventricular global  longitudinal strain is -17.6 %.   2. Right ventricular systolic function is normal. The right ventricular  size is normal. There is normal pulmonary artery systolic pressure.   3. The mitral valve is normal in structure. No evidence of mitral valve  regurgitation. No evidence of mitral stenosis.   4. Tricuspid valve regurgitation is moderate.   5. The aortic valve is normal in structure. Aortic valve regurgitation is  mild to moderate. No aortic stenosis is present.   6. The inferior vena cava is normal in size with greater than 50%  respiratory variability, suggesting right atrial pressure of 3 mmHg.   Heart Catheterization:     Assessment & Plan:     ICD-10-CM   1. SOB (shortness of breath)  R06.02 Pulmonary Function Test    2. DOE (dyspnea on exertion)  R06.09     3. History of  URI (upper respiratory infection)  Z87.09     4. Anxiety  F41.9     5. Panic  F41.0       Discussion:  This is a 80 year old female, complains today of shortness of breath dyspnea on exertion.  Has spent the last 3 years fully invested in caring for her husband unfortunately he fell at home was admitted to [facility] and passed away in 07/11/23 of this past year.  She is very tearful about this entire situation and losing him.  She states that she is noticed that she has been out and about more after losing him that she becomes increasingly short of breath with exertion.  Also notices it when she is doing more physical things such as out playing golf or in the gym.  Plan: I explained that her cardiac work-up so far looks reassuring She has used albuterol only on occasion if she has felt symptoms.  This was an inhaler left over from her husband. I explained that she does not really have a history consistent with any concern for underlying lung disease.  I think we could start off conservatively by having pulmonary function test complete.  She is okay with this plan.  If her pulmonary function test show any significant abnormality, restriction or change in DLCO would consider axial CT imaging of the chest. Patient is agreeable to this plan.  I do think that she definitely has a component of anxiety that are driving these scenarios of shortness of breath.  She also agrees that this may be the case.  Would recommend her at least trying to see if albuterol made any difference prior to strenuous activity.  She does state that she had some chest tightness after having URI symptoms a few months ago.  We can see her back after she gets PFTs done.   Current Outpatient Medications:  aspirin 325 MG tablet, Take 1 tablet (325 mg total) by mouth daily., Disp: 30 tablet, Rfl: 3   atorvastatin (LIPITOR) 20 MG tablet, Take 1 tablet (20 mg total) by mouth daily., Disp: 90 tablet, Rfl: 3   calcium carbonate  (OS-CAL) 600 MG TABS tablet, Take 600 mg by mouth at bedtime. , Disp: , Rfl:    Cholecalciferol (VITAMIN D PO), Take by mouth., Disp: , Rfl:    Cyanocobalamin (B-12) 1000 MCG CAPS, Take by mouth., Disp: , Rfl:    estropipate (ORTHO-EST 0.625) 0.75 MG tablet, Take 1 tablet (0.75 mg total) by mouth daily. (Patient taking differently: Take 0.75 mg by mouth at bedtime.), Disp: 16 tablet, Rfl: 0   levothyroxine (SYNTHROID, LEVOTHROID) 50 MCG tablet, Take 1 tablet by mouth daily., Disp: , Rfl: 11   medroxyPROGESTERone (PROVERA) 2.5 MG tablet, Take 1 tablet (2.5 mg total) by mouth daily., Disp: 90 tablet, Rfl: 3   Multiple Vitamins-Minerals (MULTIVITAMIN PO), Take by mouth., Disp: , Rfl:    zolpidem (AMBIEN) 5 MG tablet, Take 1 tablet by mouth as needed., Disp: , Rfl:    Garner Nash, DO [facility] 09/18/2021 3:43 PM

## 2021-10-04 DIAGNOSIS — M6289 Other specified disorders of muscle: Secondary | ICD-10-CM | POA: Diagnosis not present

## 2021-10-04 DIAGNOSIS — M6281 Muscle weakness (generalized): Secondary | ICD-10-CM | POA: Diagnosis not present

## 2021-10-17 ENCOUNTER — Ambulatory Visit (INDEPENDENT_AMBULATORY_CARE_PROVIDER_SITE_OTHER): Payer: Medicare Other | Admitting: Pulmonary Disease

## 2021-10-17 ENCOUNTER — Other Ambulatory Visit: Payer: Self-pay

## 2021-10-17 DIAGNOSIS — R0602 Shortness of breath: Secondary | ICD-10-CM

## 2021-10-17 LAB — PULMONARY FUNCTION TEST
DL/VA % pred: 82 %
DL/VA: 3.3 ml/min/mmHg/L
DLCO cor % pred: 52 %
DLCO cor: 10.95 ml/min/mmHg
DLCO unc % pred: 52 %
DLCO unc: 10.95 ml/min/mmHg
FEF 25-75 Post: 1.83 L/sec
FEF 25-75 Pre: 1.47 L/sec
FEF2575-%Change-Post: 24 %
FEF2575-%Pred-Post: 115 %
FEF2575-%Pred-Pre: 92 %
FEV1-%Change-Post: 5 %
FEV1-%Pred-Post: 83 %
FEV1-%Pred-Pre: 79 %
FEV1-Post: 1.87 L
FEV1-Pre: 1.77 L
FEV1FVC-%Change-Post: 2 %
FEV1FVC-%Pred-Pre: 104 %
FEV6-%Change-Post: 2 %
FEV6-%Pred-Post: 83 %
FEV6-%Pred-Pre: 81 %
FEV6-Post: 2.36 L
FEV6-Pre: 2.31 L
FEV6FVC-%Pred-Post: 105 %
FEV6FVC-%Pred-Pre: 105 %
FVC-%Change-Post: 2 %
FVC-%Pred-Post: 78 %
FVC-%Pred-Pre: 77 %
FVC-Post: 2.36 L
FVC-Pre: 2.31 L
Post FEV1/FVC ratio: 79 %
Post FEV6/FVC ratio: 100 %
Pre FEV1/FVC ratio: 77 %
Pre FEV6/FVC Ratio: 100 %
RV % pred: 64 %
RV: 1.63 L
TLC % pred: 71 %
TLC: 3.91 L

## 2021-10-17 NOTE — Progress Notes (Signed)
PFT done today. 

## 2021-10-18 ENCOUNTER — Encounter: Payer: Self-pay | Admitting: Adult Health

## 2021-10-18 ENCOUNTER — Ambulatory Visit (INDEPENDENT_AMBULATORY_CARE_PROVIDER_SITE_OTHER): Payer: Medicare Other | Admitting: Adult Health

## 2021-10-18 VITALS — BP 118/68 | HR 64 | Temp 98.3°F | Ht 67.0 in | Wt 150.0 lb

## 2021-10-18 DIAGNOSIS — J849 Interstitial pulmonary disease, unspecified: Secondary | ICD-10-CM | POA: Diagnosis not present

## 2021-10-18 DIAGNOSIS — R0602 Shortness of breath: Secondary | ICD-10-CM | POA: Insufficient documentation

## 2021-10-18 MED ORDER — ALBUTEROL SULFATE HFA 108 (90 BASE) MCG/ACT IN AERS: 1.0000 | INHALATION_SPRAY | Freq: Four times a day (QID) | RESPIRATORY_TRACT | 2 refills | Status: DC | PRN

## 2021-10-18 NOTE — Progress Notes (Signed)
@Patient  ID: Monica White, female    DOB: 10-06-1941, 80 y.o.   MRN: [id number]  Chief Complaint  Patient presents with   Follow-up    Referring provider: Crist Infante, MD  HPI: 80 yo female never smoker seen for pulmonary consult September 18, 2021 for shortness of breath with activity x1 year  TEST/EVENTS :   10/18/2021 follow-up: Shortness of breath Patient returns for a 1 month follow-up.  Patient was seen last visit for pulmonary consult for shortness of breath with activities over the last year.  Patient is relatively active she lives at home alone.  She is widowed.  She plays golf and goes to the gym.  However has noticed over the last year her activity tolerance has decreased because she gets short of breath with heavy activities.  She did have bronchitis about a month ago and was treated with antibiotics.  She says her cough has resolved.  She typically does not have a cough.  Denies any wheezing.  Has used albuterol on occasion for shortness of breath which she says helps mildly. She did have COVID-19 in July 2022.  She says she was treated with an antiviral pack was not diagnosed with pneumonia and did not receive antibiotics.  She says she feels that she fully recovered.  She has no history of autoimmune disorder.  She had no significant occupational exposure.  She was a homemaker.  Patient lives locally in [place].  She says her house is over 4 years old.  She does have a basement that she does not go into.  She does feel like it could have some mold as is had a water leak before.  She says she has seen possibly some mild mold like spots around a ceiling fan in her home.  She has no unusual hobbies.  Minimal travel.  She did go on a cruise recently but had no difficulties.  She has no pets.  No hot tub.  No birds or chicken. Patient does have some mild scoliosis.  She was set up for pulmonary function testing which was done yesterday that showed mild restriction and moderate to  severe diffusing defect.  FEV1 83%, ratio 79, FVC 78%, no significant bronchodilator response, DLCO 52%. She has been seen by cardiology.  She had a stress Myoview December 2021 with no signs of ischemia, low risk study.  EF 75%.  She had a 2D echo August 24, 2021 that showed preserved EF.  Diastolic dysfunction mild.  Normal pulmonary artery pressure.  Moderate tricuspid valve regurg.   No Known Allergies  Immunization History  Administered Date(s) Administered   Fluad Quad(high Dose 65+) 09/18/2021   PFIZER(Purple Top)SARS-COV-2 Vaccination 10/28/2020   Tdap 11/26/2005    Past Medical History:  Diagnosis Date   Hypercholesteremia    Hypothyroid 2015    Tobacco History: Social History   Tobacco Use  Smoking Status Never  Smokeless Tobacco Never   Counseling given: Not Answered   Outpatient Medications Prior to Visit  Medication Sig Dispense Refill   aspirin 325 MG tablet Take 1 tablet (325 mg total) by mouth daily. 30 tablet 3   Cholecalciferol (VITAMIN D PO) Take by mouth.     Cyanocobalamin (B-12) 1000 MCG CAPS Take by mouth.     estropipate (ORTHO-EST 0.625) 0.75 MG tablet Take 1 tablet (0.75 mg total) by mouth daily. (Patient taking differently: Take 0.75 mg by mouth at bedtime.) 16 tablet 0   levothyroxine (SYNTHROID, LEVOTHROID) 50 MCG tablet Take 1  tablet by mouth daily.  11   medroxyPROGESTERone (PROVERA) 2.5 MG tablet Take 1 tablet (2.5 mg total) by mouth daily. 90 tablet 3   Multiple Vitamins-Minerals (MULTIVITAMIN PO) Take by mouth.     zolpidem (AMBIEN) 5 MG tablet Take 1 tablet by mouth as needed.     atorvastatin (LIPITOR) 20 MG tablet Take 1 tablet (20 mg total) by mouth daily. (Patient not taking: Reported on 10/18/2021) 90 tablet 3   calcium carbonate (OS-CAL) 600 MG TABS tablet Take 600 mg by mouth at bedtime.  (Patient not taking: Reported on 10/18/2021)     No facility-administered medications prior to visit.     Review of Systems:    Constitutional:   No  weight loss, night sweats,  Fevers, chills,  +fatigue, or  lassitude.  HEENT:   No headaches,  Difficulty swallowing,  Tooth/dental problems, or  Sore throat,                No sneezing, itching, ear ache, nasal congestion, post nasal drip,   CV:  No chest pain,  Orthopnea, PND, swelling in lower extremities, anasarca, dizziness, palpitations, syncope.   GI  No heartburn, indigestion, abdominal pain, nausea, vomiting, diarrhea, change in bowel habits, loss of appetite, bloody stools.   Resp:   No excess mucus, no productive cough,  No non-productive cough,  No coughing up of blood.  No change in color of mucus.  No wheezing.  No chest wall deformity  Skin: no rash or lesions.  GU: no dysuria, change in color of urine, no urgency or frequency.  No flank pain, no hematuria   MS:  No joint pain or swelling.  No decreased range of motion.  No back pain.    Physical Exam  BP 118/68 (BP Location: Left Arm, Patient Position: Sitting, Cuff Size: Normal)   Pulse 64   Temp 98.3 F (36.8 C) (Oral)   Ht 5\' 7"  (1.702 m)   Wt 150 lb (68 kg)   LMP 01/13/1994   SpO2 95%   BMI 23.49 kg/m   GEN: A/Ox3; pleasant , NAD, well nourished    HEENT:  Drain/AT,  NOSE-clear, THROAT-clear, no lesions, no postnasal drip or exudate noted.   NECK:  Supple w/ fair ROM; no JVD; normal carotid impulses w/o bruits; no thyromegaly or nodules palpated; no lymphadenopathy.    RESP  Clear  P & A; w/o, wheezes/ rales/ or rhonchi. no accessory muscle use, no dullness to percussion  CARD:  RRR, no m/r/g, no peripheral edema, pulses intact, no cyanosis or clubbing.  GI:   Soft & nt; nml bowel sounds; no organomegaly or masses detected.   Musco: Warm bil, no deformities or joint swelling noted.   Neuro: alert, no focal deficits noted.    Skin: Warm, no lesions or rashes    Lab Results:  CBC    Component Value Date/Time   WBC 6.6 09/19/2017 1820   RBC 4.27 09/19/2017 1820   HGB  13.6 09/19/2017 1829   HCT 40.0 09/19/2017 1829   PLT 276 09/19/2017 1820   MCV 94.4 09/19/2017 1820   MCH 31.9 09/19/2017 1820   MCHC 33.7 09/19/2017 1820   RDW 13.0 09/19/2017 1820   LYMPHSABS 2.1 09/19/2017 1820   MONOABS 0.4 09/19/2017 1820   EOSABS 0.3 09/19/2017 1820   BASOSABS 0.1 09/19/2017 1820    BMET    Component Value Date/Time   NA 140 09/19/2017 1829   K 3.7 09/19/2017 1829   CL  104 09/19/2017 1829   CO2 25 09/19/2017 1820   GLUCOSE 92 09/19/2017 1829   BUN 22 (H) 09/19/2017 1829   CREATININE 0.85 02/17/2018 1123   CALCIUM 9.2 09/19/2017 1820   GFRNONAA >60 02/17/2018 1123   GFRAA >60 02/17/2018 1123    BNP No results found for: BNP  ProBNP No results found for: PROBNP  Imaging: No results found.    PFT Results Latest Ref Rng & Units 10/17/2021  FVC-Pre L 2.31  FVC-Predicted Pre % 77  FVC-Post L 2.36  FVC-Predicted Post % 78  Pre FEV1/FVC % % 77  Post FEV1/FCV % % 79  FEV1-Pre L 1.77  FEV1-Predicted Pre % 79  FEV1-Post L 1.87  DLCO uncorrected ml/min/mmHg 10.95  DLCO UNC% % 52  DLCO corrected ml/min/mmHg 10.95  DLCO COR %Predicted % 52  DLVA Predicted % 82  TLC L 3.91  TLC % Predicted % 71  RV % Predicted % 64    No results found for: NITRICOXIDE      Assessment & Plan:   Shortness of breath Shortness of breath with activity x1 year questionable etiology.  Cardiac work-up was negative for ischemic work-up.  She did have grade 1 diastolic dysfunction and moderate tricuspid valve regurgitation which could contribute to some shortness of breath.  She has no evidence of volume overload on exam. Pulmonary function testing do show mild restriction and a moderate to severe diffusing defect.  Could have an underlying interstitial process. We will check a high-resolution CT chest to rule out ILD. Doubt this is asthma but may have some mild intermittent asthma.  Albuterol inhaler as needed  Plan  Patient Instructions  Albuterol inhaler  1-2 puffs every 6hrs as needed  Activity as tolerated.  HRCT chest in the couple of weeks.  Follow up with Dr. Valeta Harms in 6-8 weeks and As needed          Rexene Edison, NP 10/18/2021

## 2021-10-18 NOTE — Assessment & Plan Note (Signed)
Shortness of breath with activity x1 year questionable etiology.  Cardiac work-up was negative for ischemic work-up.  She did have grade 1 diastolic dysfunction and moderate tricuspid valve regurgitation which could contribute to some shortness of breath.  She has no evidence of volume overload on exam. Pulmonary function testing do show mild restriction and a moderate to severe diffusing defect.  Could have an underlying interstitial process. We will check a high-resolution CT chest to rule out ILD. Doubt this is asthma but may have some mild intermittent asthma.  Albuterol inhaler as needed  Plan  Patient Instructions  Albuterol inhaler 1-2 puffs every 6hrs as needed  Activity as tolerated.  HRCT chest in the couple of weeks.  Follow up with Dr. Valeta Harms in 6-8 weeks and As needed

## 2021-10-18 NOTE — Addendum Note (Signed)
Addended by: Monna Fam L on: 10/18/2021 11:13 AM   Modules accepted: Orders

## 2021-10-18 NOTE — Patient Instructions (Addendum)
Albuterol inhaler 1-2 puffs every 6hrs as needed  Activity as tolerated.  HRCT chest in the couple of weeks.  Follow up with Dr. Valeta Harms in 6-8 weeks and As needed

## 2021-10-25 DIAGNOSIS — M6281 Muscle weakness (generalized): Secondary | ICD-10-CM | POA: Diagnosis not present

## 2021-10-25 DIAGNOSIS — M6289 Other specified disorders of muscle: Secondary | ICD-10-CM | POA: Diagnosis not present

## 2021-11-04 DIAGNOSIS — Z1231 Encounter for screening mammogram for malignant neoplasm of breast: Secondary | ICD-10-CM | POA: Diagnosis not present

## 2021-11-09 ENCOUNTER — Ambulatory Visit
Admission: RE | Admit: 2021-11-09 | Discharge: 2021-11-09 | Disposition: A | Discharge status: Home / Self Care | Payer: Medicare Other | Source: Ambulatory Visit | Attending: Adult Health | Admitting: Adult Health

## 2021-11-09 ENCOUNTER — Other Ambulatory Visit: Payer: Self-pay

## 2021-11-09 DIAGNOSIS — I2510 Atherosclerotic heart disease of native coronary artery without angina pectoris: Secondary | ICD-10-CM | POA: Diagnosis not present

## 2021-11-09 DIAGNOSIS — K449 Diaphragmatic hernia without obstruction or gangrene: Secondary | ICD-10-CM | POA: Diagnosis not present

## 2021-11-09 DIAGNOSIS — I700 Atherosclerosis of aorta: Secondary | ICD-10-CM | POA: Diagnosis not present

## 2021-11-09 DIAGNOSIS — R0600 Dyspnea, unspecified: Secondary | ICD-10-CM | POA: Diagnosis not present

## 2021-11-09 DIAGNOSIS — J849 Interstitial pulmonary disease, unspecified: Secondary | ICD-10-CM

## 2021-11-10 DIAGNOSIS — M1711 Unilateral primary osteoarthritis, right knee: Secondary | ICD-10-CM | POA: Diagnosis not present

## 2021-11-10 DIAGNOSIS — M1712 Unilateral primary osteoarthritis, left knee: Secondary | ICD-10-CM | POA: Diagnosis not present

## 2021-11-13 NOTE — Progress Notes (Signed)
Called and spoke with patient, provided results/recommendations per Rexene Edison NP.  She verbalized understanding.  Results routed to her pcp, Dr. Joylene Draft as requested by patient as she has an upcoming appointment with him.  Patient does want to know if there is a steroid inhaler she could use to help with her breathing, she was given a steroid injection last week for something unrelated and it helped her breathing.  Advised I would discuss with Tammy and after I have a response I will call her back.  Tammy, Patient wants to know if a steroid in haler would help her breathing?  Please advise.  Thank you.

## 2021-11-16 NOTE — Progress Notes (Signed)
Called and spoke with patient, advised her that Monica White stated to discuss the inhaler with steroids with Dr. Valeta Harms on 12/07/2021.  Advised that the results of her CT scan have been routed to Dr. Joylene Draft as she requested.  She verbalized understanding.  Nothing further needed.

## 2021-11-21 DIAGNOSIS — M6289 Other specified disorders of muscle: Secondary | ICD-10-CM | POA: Diagnosis not present

## 2021-11-21 DIAGNOSIS — M6281 Muscle weakness (generalized): Secondary | ICD-10-CM | POA: Diagnosis not present

## 2021-11-22 ENCOUNTER — Telehealth: Payer: Self-pay | Admitting: Internal Medicine

## 2021-11-22 DIAGNOSIS — F329 Major depressive disorder, single episode, unspecified: Secondary | ICD-10-CM | POA: Diagnosis not present

## 2021-11-22 DIAGNOSIS — D500 Iron deficiency anemia secondary to blood loss (chronic): Secondary | ICD-10-CM | POA: Diagnosis not present

## 2021-11-22 DIAGNOSIS — E785 Hyperlipidemia, unspecified: Secondary | ICD-10-CM | POA: Diagnosis not present

## 2021-11-22 DIAGNOSIS — E039 Hypothyroidism, unspecified: Secondary | ICD-10-CM | POA: Diagnosis not present

## 2021-11-22 DIAGNOSIS — R413 Other amnesia: Secondary | ICD-10-CM | POA: Diagnosis not present

## 2021-11-22 DIAGNOSIS — E538 Deficiency of other specified B group vitamins: Secondary | ICD-10-CM | POA: Diagnosis not present

## 2021-11-22 DIAGNOSIS — I509 Heart failure, unspecified: Secondary | ICD-10-CM | POA: Diagnosis not present

## 2021-11-22 DIAGNOSIS — R0609 Other forms of dyspnea: Secondary | ICD-10-CM | POA: Diagnosis not present

## 2021-11-22 DIAGNOSIS — M25569 Pain in unspecified knee: Secondary | ICD-10-CM | POA: Diagnosis not present

## 2021-11-22 DIAGNOSIS — I712 Thoracic aortic aneurysm, without rupture, unspecified: Secondary | ICD-10-CM | POA: Diagnosis not present

## 2021-11-22 NOTE — Telephone Encounter (Signed)
Attempted to call the PCP for more information. The mailbox was full and office is closed.  Called the patient, stating she has been having issues with SOB and Dr. Harrington Challenger had her do a stress test Dec of last year that was low risk. Then in Sept she had an Echo and no changes needed to be made. She had a CT of her Chest and PFT's through Pulmonology.  Dr. Joylene Draft reviewed all these results today at the visit and the patient continues to have this SOB. He told the patient that he thought she needed a heart cath, also that he was not the heart doctor but that is what he recommends. He advised the patient to call us and let us know what his recommendation was and see if Dr. Harrington Challenger agreed.

## 2021-11-22 NOTE — Telephone Encounter (Signed)
Patient saw that dr Joylene Draft today and he look at all of her results. he recommend that she gets a heart catheterization and that she should do it asap. Patient states that we can contact his office. Please advise

## 2021-11-24 NOTE — Progress Notes (Signed)
° °Cardiology Office Note   ° °Date:  11/29/2021  ° °ID:  Roxine Strauser, DOB 02/20/1941, MRN [id number] ° ° °PCP:  Perini, Mark, MD °  °[facility]  °Cardiologist:  Paula Ross, MD   °Advanced Practice Provider:  No care team member to display °Electrophysiologist:  None  ° °[id number]}  ° °Chief Complaint  °Patient presents with  ° Shortness of Breath  ° ° °History of Present Illness:  °Maryjane Winterbottom is a 80 y.o. female with a history of °Hyperlipidemia and thoracic aortic aneurysm-37mm 11/20193.  Also retinal artery occlusion  Pt initially referred for abnormal echo  In 2018  Reported at 45 to 50% with hypokinesis of the mid/apical anteroseptal wall   Cardiac MRI in March 2019 LVEF calculated at 57%  No hypokinesis  RVEF 53%, normal NST 10/2020, normal LVEF echo 07/2021. ° °  Patient complains of DOE with decreased exercise tolerance >1yr. 3 vessel coronary atherosclerosis noted on CT scan 11/09/21, Low risk NST 10/2020. Normal echo 07/2021. Dr. Ross recommends R/L heart cath for further diagnosis/treatment ° °Mild restrictive lung disease but no ILD on CT. ° °Patient comes in for f/u. Was told she was anemic (hgb 11)and is supposed to start iron and see GI. No melena.DOE and progressively worsened-just washing and drying her hair. Walking in here today. Never smoked. On ASA 325 mg for retinal artery occlusion for 4 yrs. Denies chest pain or tightness.  °  ° °  °  ° ° ° °Past Medical History:  °Diagnosis Date  ° Hypercholesteremia   ° Hypothyroid 2015  ° ° °Past Surgical History:  °Procedure Laterality Date  ° TUBAL LIGATION  1985  ° ° °Current Medications: °Current Meds  °Medication Sig  ° albuterol (VENTOLIN HFA) 108 (90 Base) MCG/ACT inhaler Inhale 1-2 puffs into the lungs every 6 (six) hours as needed.  ° aspirin 325 MG tablet Take 1 tablet (325 mg total) by mouth daily.  ° Cholecalciferol (VITAMIN D PO) Take by mouth.  ° Cyanocobalamin (B-12) 1000 MCG CAPS Take by mouth.  ° estropipate  (ORTHO-EST 0.625) 0.75 MG tablet Take 1 tablet (0.75 mg total) by mouth daily. (Patient taking differently: Take 0.75 mg by mouth at bedtime.)  ° ferrous sulfate 325 (65 FE) MG tablet Take 325 mg by mouth daily with breakfast.  ° levothyroxine (SYNTHROID, LEVOTHROID) 50 MCG tablet Take 1 tablet by mouth daily.  ° medroxyPROGESTERone (PROVERA) 2.5 MG tablet Take 1 tablet (2.5 mg total) by mouth daily.  ° Multiple Vitamins-Minerals (MULTIVITAMIN PO) Take by mouth.  ° simvastatin (ZOCOR) 20 MG tablet 1 tablet in the evening  ° zolpidem (AMBIEN) 5 MG tablet Take 1 tablet by mouth as needed.  °  ° °Allergies:   Atorvastatin, Escitalopram, Pitavastatin, and Tramadol  ° °Social History  ° °Socioeconomic History  ° Marital status: Widowed  °  Spouse name: Not on file  ° Number of children: Not on file  ° Years of education: Not on file  ° Highest education level: Not on file  °Occupational History  ° Not on file  °Tobacco Use  ° Smoking status: Never  ° Smokeless tobacco: Never  °Vaping Use  ° Vaping Use: Never used  °Substance and Sexual Activity  ° Alcohol use: Yes  °  Alcohol/week: 1.0 - 2.0 standard drink  °  Types: 1 - 2 Standard drinks or equivalent per week  ° Drug use: No  ° Sexual activity: Yes  °  Partners: Male  °    Birth control/protection: Post-menopausal, Surgical  °  Comment: Tubal  °Other Topics Concern  ° Not on file  °Social History Narrative  ° Not on file  ° °Social Determinants of Health  ° °Financial Resource Strain: Not on file  °Food Insecurity: Not on file  °Transportation Needs: Not on file  °Physical Activity: Not on file  °Stress: Not on file  °Social Connections: Not on file  °  ° °Family History:  The patient's  family history includes Heart disease in her mother.  ° °ROS:   °Please see the history of present illness.    °ROS All other systems reviewed and are negative. ° ° °PHYSICAL EXAM:   °VS:  BP 122/74    Pulse 71    Ht 5' 7" (1.702 m)    Wt 148 lb 12.8 oz (67.5 kg)    LMP 01/13/1994     SpO2 98%    BMI 23.31 kg/m²   °Physical Exam  °GEN: Well nourished, well developed, in no acute distress  °Neck: no JVD, carotid bruits, or masses °Cardiac:RRR; no murmurs, rubs, or gallops  °Respiratory:  clear to auscultation bilaterally, normal work of breathing °GI: soft, nontender, nondistended, + BS °Ext: without cyanosis, clubbing, or edema, Good distal pulses bilaterally °Neuro:  Alert and Oriented x 3 °Psych: euthymic mood, full affect ° °Wt Readings from Last 3 Encounters:  °11/29/21 148 lb 12.8 oz (67.5 kg)  °10/18/21 150 lb (68 kg)  °09/18/21 151 lb 12.8 oz (68.9 kg)  °  ° ° °Studies/Labs Reviewed:  °  °EKG:  EKG is  ordered today.  The ekg ordered today demonstrates NSR ° °Recent Labs: °No results found for requested labs within last 8760 hours.  ° °Lipid Panel °   °Component Value Date/Time  ° CHOL 204 (H) 01/06/2021 0939  ° TRIG 102 01/06/2021 0939  ° HDL 57 01/06/2021 0939  ° CHOLHDL 3.6 01/06/2021 0939  ° CHOLHDL 3.9 09/20/2017 0607  ° VLDL 10 09/20/2017 0607  ° LDLCALC 129 (H) 01/06/2021 0939  ° ° °Additional studies/ records that were reviewed today include:  °CT 10/2021 °IMPRESSION: °1. No findings to suggest interstitial lung disease. °2. Small 2-4 mm pulmonary nodules scattered throughout the lungs °bilaterally, nonspecific, but statistically likely benign. No °follow-up needed if patient is low-risk (and has no known or °suspected primary neoplasm). Non-contrast chest CT can be considered °in 12 months if patient is high-risk. This recommendation follows °the consensus statement: Guidelines for Management of Incidental °Pulmonary Nodules Detected on CT Images: From the Fleischner Society °2017; Radiology 2017; 284:228-243. °3. Aortic atherosclerosis, in addition to three-vessel coronary °artery disease. Please note that although the presence of coronary °artery calcium documents the presence of coronary artery disease, °the severity of this disease and any potential stenosis cannot  be °assessed on this non-gated CT examination. Assessment for potential °risk factor modification, dietary therapy or pharmacologic therapy °may be warranted, if clinically indicated. °4. Large hiatal hernia. ° °Cardiovascular: Heart size is normal. There is no significant °pericardial fluid, thickening or pericardial calcification. There is °aortic atherosclerosis, as well as atherosclerosis of the great °vessels of the mediastinum and the coronary arteries, including °calcified atherosclerotic plaque in the left anterior descending, °left circumflex and right coronary arteries. °  °Echo 07/2021 °IMPRESSIONS  ° ° ° 1. Left ventricular ejection fraction, by estimation, is 60 to 65%. The  °left ventricle has normal function. The left ventricle has no regional  °wall motion abnormalities. Left ventricular diastolic parameters are  °consistent   with Grade I diastolic  °dysfunction (impaired relaxation). The average left ventricular global  °longitudinal strain is -17.6 %.  ° 2. Right ventricular systolic function is normal. The right ventricular  °size is normal. There is normal pulmonary artery systolic pressure.  ° 3. The mitral valve is normal in structure. No evidence of mitral valve  °regurgitation. No evidence of mitral stenosis.  ° 4. Tricuspid valve regurgitation is moderate.  ° 5. The aortic valve is normal in structure. Aortic valve regurgitation is  °mild to moderate. No aortic stenosis is present.  ° 6. The inferior vena cava is normal in size with greater than 50%  °respiratory variability, suggesting right atrial pressure of 3 mmHg.  ° °NST 10/2020 °The left ventricular ejection fraction is hyperdynamic (>65%). °Nuclear stress EF: 75%. °There was no ST segment deviation noted during stress. °The study is normal. °This is a low risk study. °  °Normal resting and stress perfusion. No ischemia or infarction EF °75% ° ° °Risk Assessment/Calculations:   °  ° ° ° ° °ASSESSMENT:   ° °1. Preop examination   °2.  Shortness of breath   °3. Restrictive lung disease   °4. Other hyperlipidemia   °5. Thoracic aortic aneurysm without rupture, unspecified part   °6. Central retinal artery occlusion, unspecified laterality   ° ° ° °PLAN:  °In order of problems listed above: ° °DOE with decreased exercise tolerance >1yr. 3 vessel coronary atherosclerosis noted on recent CT scan. Low risk NST 10/2020. Normal echo 07/2021. Dr. Ross recommends R/L heart cath for further diagnosis/treatment. Patient is agreeable. °I have reviewed the risks, indications, and alternatives to angioplasty and stenting with the patient. Risks include but are not limited to bleeding, infection, vascular injury, stroke, myocardial infection, arrhythmia, kidney injury, radiation-related injury in the case of prolonged fluoroscopy use, emergency cardiac surgery, and death. The patient understands the risks of serious complication is low (<1%) and patient agrees to proceed.  °CBC checked last week by Dr. Perini we will check be met. ° °Mild restrictive lung disease but no ILD on CT ° °HLD-LDL148 03/2021 now on zocor -not sure if she had recent labs. ° °Thoracic aortic aneurysm 37 mm on CTA 09/2018 ° °Retinal artery occlusion on ASA 325 mg daily per ophthalmology.  ° °Iron deficiency anemia diagnosed by PCP hemoglobin 11 on 11/22/2021.  No melena.  She says she is going to be referred to GI. ° ° ° °Shared Decision Making/Informed Consent   °Shared Decision Making/Informed Consent °The risks [stroke (1 in 1000), death (1 in 1000), kidney failure [usually temporary] (1 in 500), bleeding (1 in 200), allergic reaction [possibly serious] (1 in 200)], benefits (diagnostic support and management of coronary artery disease) and alternatives of a cardiac catheterization were discussed in detail with Ms. Kirsh and she is willing to proceed.  ° ° °Medication Adjustments/Labs and Tests Ordered: °Current medicines are reviewed at length with the patient today.  Concerns regarding  medicines are outlined above.  Medication changes, Labs and Tests ordered today are listed in the Patient Instructions below. °Patient Instructions  °Medication Instructions:  °Your physician recommends that you continue on your current medications as directed. Please refer to the Current Medication list given to you today. ° °*If you need a refill on your cardiac medications before your next appointment, please call your pharmacy* ° ° °Lab Work: °TODAY:  BMET  ° °If you have labs (blood work) drawn today and your tests are completely normal, you will receive your   results only by: °MyChart Message (if you have MyChart) OR °A paper copy in the mail °If you have any lab test that is abnormal or we need to change your treatment, we will call you to review the results. ° ° °Testing/Procedures: °Your physician has requested that you have a cardiac catheterization. Cardiac catheterization is used to diagnose and/or treat various heart conditions. Doctors may recommend this procedure for a number of different reasons. The most common reason is to evaluate chest pain. Chest pain can be a symptom of coronary artery disease (CAD), and cardiac catheterization can show whether plaque is narrowing or blocking your heart’s arteries. This procedure is also used to evaluate the valves, as well as measure the blood flow and oxygen levels in different parts of your heart. For further information please visit [email or web address]. Please follow instruction sheet, BELOW: ° ° ° °[facility] °[facility] °[address], [address] °[place] [address] °Dept: [phone] °Loc: [phone] ° °[place]  11/29/2021 ° °You are scheduled for a Cardiac Catheterization on Friday, January 6 with Dr. Michael Cooper. ° °1. Please arrive at the [facility] (Main Entrance A) at [facility]: [address] [place], [place] [address] at 8:30 AM (This time is two hours  before your procedure to ensure your preparation). Free valet parking service is available.  ° °Special note: Every effort is made to have your procedure done on time. Please understand that emergencies sometimes delay scheduled procedures. ° °2. Diet: Do not eat solid foods after midnight.  The patient may have clear liquids until 5am upon the day of the procedure. ° °3. Labs: You will need to have blood drawn on  TODAY ° °4. Medication instructions in preparation for your procedure: ° ° Contrast Allergy: No ° ° °On the morning of your procedure, take your Aspirin and any morning medicines NOT listed above.  You may use sips of water. ° °5. Plan for one night stay--bring personal belongings. °6. Bring a current list of your medications and current insurance cards. °7. You MUST have a responsible person to drive you home. °8. Someone MUST be with you the first 24 hours after you arrive home or your discharge will be delayed. °9. Please wear clothes that are easy to get on and off and wear slip-on shoes. ° °Thank you for allowing us to care for you! °  -- [facility] ° ° ° ° °Follow-Up: °At [facility], you and your health needs are our priority.  As part of our continuing mission to provide you with exceptional heart care, we have created designated Provider Care Teams.  These Care Teams include your primary Cardiologist (physician) and Advanced Practice Providers (APPs -  Physician Assistants and Nurse Practitioners) who all work together to provide you with the care you need, when you need it. ° °We recommend signing up for the patient portal called "MyChart".  Sign up information is provided on this After Visit Summary.  MyChart is used to connect with patients for Virtual Visits (Telemedicine).  Patients are able to view lab/test results, encounter notes, upcoming appointments, etc.  Non-urgent messages can be sent to your provider as well.   °To learn more about what you can  do with MyChart, go to [email or web address].   ° °Your next appointment:   °2 week(s)  12/19/2021 ARRIVE AT 9:05 ° °The format for your next appointment:   °In Person ° °Provider:   °  Paula Ross, MD      ° ° °Other Instructions ° °  ° °Signed, °Susy Placzek, PA-C  °11/29/2021 10:42 AM    °[facility] °[address], [place], [place]  [address] °Phone: [phone]; Fax: [phone]  ° ° °

## 2021-11-24 NOTE — Telephone Encounter (Signed)
Pt seen by Mable Paris   COntinued SOB   Pt needs to be seen in clinic if going to be set up for cath  WOuld set up for R and L heart cath to evaluate SOB   Can be an add on with me or with APP ( I cannot see on 1/5)

## 2021-11-24 NOTE — H&P (View-Only) (Signed)
° °Cardiology Office Note   ° °Date:  11/29/2021  ° °ID:  Monica White, DOB 08/15/1941, MRN [id number] ° ° °PCP:  Perini, Mark, MD °  °[facility]  °Cardiologist:  Paula Ross, MD   °Advanced Practice Provider:  No care team member to display °Electrophysiologist:  None  ° °[id number]}  ° °Chief Complaint  °Patient presents with  ° Shortness of Breath  ° ° °History of Present Illness:  °Monica White is a 80 y.o. female with a history of °Hyperlipidemia and thoracic aortic aneurysm-37mm 11/20193.  Also retinal artery occlusion  Pt initially referred for abnormal echo  In 2018  Reported at 45 to 50% with hypokinesis of the mid/apical anteroseptal wall   Cardiac MRI in March 2019 LVEF calculated at 57%  No hypokinesis  RVEF 53%, normal NST 10/2020, normal LVEF echo 07/2021. ° °  Patient complains of DOE with decreased exercise tolerance >1yr. 3 vessel coronary atherosclerosis noted on CT scan 11/09/21, Low risk NST 10/2020. Normal echo 07/2021. Dr. Ross recommends R/L heart cath for further diagnosis/treatment ° °Mild restrictive lung disease but no ILD on CT. ° °Patient comes in for f/u. Was told she was anemic (hgb 11)and is supposed to start iron and see GI. No melena.DOE and progressively worsened-just washing and drying her hair. Walking in here today. Never smoked. On ASA 325 mg for retinal artery occlusion for 4 yrs. Denies chest pain or tightness.  °  ° °  °  ° ° ° °Past Medical History:  °Diagnosis Date  ° Hypercholesteremia   ° Hypothyroid 2015  ° ° °Past Surgical History:  °Procedure Laterality Date  ° TUBAL LIGATION  1985  ° ° °Current Medications: °Current Meds  °Medication Sig  ° albuterol (VENTOLIN HFA) 108 (90 Base) MCG/ACT inhaler Inhale 1-2 puffs into the lungs every 6 (six) hours as needed.  ° aspirin 325 MG tablet Take 1 tablet (325 mg total) by mouth daily.  ° Cholecalciferol (VITAMIN D PO) Take by mouth.  ° Cyanocobalamin (B-12) 1000 MCG CAPS Take by mouth.  ° estropipate  (ORTHO-EST 0.625) 0.75 MG tablet Take 1 tablet (0.75 mg total) by mouth daily. (Patient taking differently: Take 0.75 mg by mouth at bedtime.)  ° ferrous sulfate 325 (65 FE) MG tablet Take 325 mg by mouth daily with breakfast.  ° levothyroxine (SYNTHROID, LEVOTHROID) 50 MCG tablet Take 1 tablet by mouth daily.  ° medroxyPROGESTERone (PROVERA) 2.5 MG tablet Take 1 tablet (2.5 mg total) by mouth daily.  ° Multiple Vitamins-Minerals (MULTIVITAMIN PO) Take by mouth.  ° simvastatin (ZOCOR) 20 MG tablet 1 tablet in the evening  ° zolpidem (AMBIEN) 5 MG tablet Take 1 tablet by mouth as needed.  °  ° °Allergies:   Atorvastatin, Escitalopram, Pitavastatin, and Tramadol  ° °Social History  ° °Socioeconomic History  ° Marital status: Widowed  °  Spouse name: Not on file  ° Number of children: Not on file  ° Years of education: Not on file  ° Highest education level: Not on file  °Occupational History  ° Not on file  °Tobacco Use  ° Smoking status: Never  ° Smokeless tobacco: Never  °Vaping Use  ° Vaping Use: Never used  °Substance and Sexual Activity  ° Alcohol use: Yes  °  Alcohol/week: 1.0 - 2.0 standard drink  °  Types: 1 - 2 Standard drinks or equivalent per week  ° Drug use: No  ° Sexual activity: Yes  °  Partners: Male  °    Female    Birth control/protection: Post-menopausal, Surgical    Comment: Tubal  Other Topics Concern   Not on file  Social History Narrative   Not on file   Social Determinants of Health   Financial Resource Strain: Not on file  Food Insecurity: Not on file  Transportation Needs: Not on file  Physical Activity: Not on file  Stress: Not on file  Social Connections: Not on file     Family History:  The patient's  family history includes Heart disease in her mother.   ROS:   Please see the history of present illness.    ROS All other systems reviewed and are negative.   PHYSICAL EXAM:   VS:  BP 122/74    Pulse 71    Ht 5' 7" (1.702 m)    Wt 148 lb 12.8 oz (67.5 kg)    LMP 01/13/1994     SpO2 98%    BMI 23.31 kg/m   Physical Exam  GEN: Well nourished, well developed, in no acute distress  Neck: no JVD, carotid bruits, or masses Cardiac:RRR; no murmurs, rubs, or gallops  Respiratory:  clear to auscultation bilaterally, normal work of breathing GI: soft, nontender, nondistended, + BS Ext: without cyanosis, clubbing, or edema, Good distal pulses bilaterally Neuro:  Alert and Oriented x 3 Psych: euthymic mood, full affect  Wt Readings from Last 3 Encounters:  11/29/21 148 lb 12.8 oz (67.5 kg)  10/18/21 150 lb (68 kg)  09/18/21 151 lb 12.8 oz (68.9 kg)      Studies/Labs Reviewed:    EKG:  EKG is  ordered today.  The ekg ordered today demonstrates NSR  Recent Labs: No results found for requested labs within last 8760 hours.   Lipid Panel    Component Value Date/Time   CHOL 204 (H) 01/06/2021 0939   TRIG 102 01/06/2021 0939   HDL 57 01/06/2021 0939   CHOLHDL 3.6 01/06/2021 0939   CHOLHDL 3.9 09/20/2017 0607   VLDL 10 09/20/2017 0607   LDLCALC 129 (H) 01/06/2021 0939    Additional studies/ records that were reviewed today include:  CT 10/2021 IMPRESSION: 1. No findings to suggest interstitial lung disease. 2. Small 2-4 mm pulmonary nodules scattered throughout the lungs bilaterally, nonspecific, but statistically likely benign. No follow-up needed if patient is low-risk (and has no known or suspected primary neoplasm). Non-contrast chest CT can be considered in 12 months if patient is high-risk. This recommendation follows the consensus statement: Guidelines for Management of Incidental Pulmonary Nodules Detected on CT Images: From the Fleischner Society 2017; Radiology 2017; 284:228-243. 3. Aortic atherosclerosis, in addition to three-vessel coronary artery disease. Please note that although the presence of coronary artery calcium documents the presence of coronary artery disease, the severity of this disease and any potential stenosis cannot  be assessed on this non-gated CT examination. Assessment for potential risk factor modification, dietary therapy or pharmacologic therapy may be warranted, if clinically indicated. 4. Large hiatal hernia.  Cardiovascular: Heart size is normal. There is no significant pericardial fluid, thickening or pericardial calcification. There is aortic atherosclerosis, as well as atherosclerosis of the great vessels of the mediastinum and the coronary arteries, including calcified atherosclerotic plaque in the left anterior descending, left circumflex and right coronary arteries.   Echo 07/2021 IMPRESSIONS     1. Left ventricular ejection fraction, by estimation, is 60 to 65%. The  left ventricle has normal function. The left ventricle has no regional  wall motion abnormalities. Left ventricular diastolic  with Grade I diastolic  °dysfunction (impaired relaxation). The average left ventricular global  °longitudinal strain is -17.6 %.  ° 2. Right ventricular systolic function is normal. The right ventricular  °size is normal. There is normal pulmonary artery systolic pressure.  ° 3. The mitral valve is normal in structure. No evidence of mitral valve  °regurgitation. No evidence of mitral stenosis.  ° 4. Tricuspid valve regurgitation is moderate.  ° 5. The aortic valve is normal in structure. Aortic valve regurgitation is  °mild to moderate. No aortic stenosis is present.  ° 6. The inferior vena cava is normal in size with greater than 50%  °respiratory variability, suggesting right atrial pressure of 3 mmHg.  ° °NST 10/2020 °The left ventricular ejection fraction is hyperdynamic (>65%). °Nuclear stress EF: 75%. °There was no ST segment deviation noted during stress. °The study is normal. °This is a low risk study. °  °Normal resting and stress perfusion. No ischemia or infarction EF °75% ° ° °Risk Assessment/Calculations:   °  ° ° ° ° °ASSESSMENT:   ° °1. Preop examination   °2.  Shortness of breath   °3. Restrictive lung disease   °4. Other hyperlipidemia   °5. Thoracic aortic aneurysm without rupture, unspecified part   °6. Central retinal artery occlusion, unspecified laterality   ° ° ° °PLAN:  °In order of problems listed above: ° °DOE with decreased exercise tolerance >1yr. 3 vessel coronary atherosclerosis noted on recent CT scan. Low risk NST 10/2020. Normal echo 07/2021. Dr. Ross recommends R/L heart cath for further diagnosis/treatment. Patient is agreeable. °I have reviewed the risks, indications, and alternatives to angioplasty and stenting with the patient. Risks include but are not limited to bleeding, infection, vascular injury, stroke, myocardial infection, arrhythmia, kidney injury, radiation-related injury in the case of prolonged fluoroscopy use, emergency cardiac surgery, and death. The patient understands the risks of serious complication is low (<1%) and patient agrees to proceed.  °CBC checked last week by Dr. Perini we will check be met. ° °Mild restrictive lung disease but no ILD on CT ° °HLD-LDL148 03/2021 now on zocor -not sure if she had recent labs. ° °Thoracic aortic aneurysm 37 mm on CTA 09/2018 ° °Retinal artery occlusion on ASA 325 mg daily per ophthalmology.  ° °Iron deficiency anemia diagnosed by PCP hemoglobin 11 on 11/22/2021.  No melena.  She says she is going to be referred to GI. ° ° ° °Shared Decision Making/Informed Consent   °Shared Decision Making/Informed Consent °The risks [stroke (1 in 1000), death (1 in 1000), kidney failure [usually temporary] (1 in 500), bleeding (1 in 200), allergic reaction [possibly serious] (1 in 200)], benefits (diagnostic support and management of coronary artery disease) and alternatives of a cardiac catheterization were discussed in detail with Ms. Lac and she is willing to proceed.  ° ° °Medication Adjustments/Labs and Tests Ordered: °Current medicines are reviewed at length with the patient today.  Concerns regarding  medicines are outlined above.  Medication changes, Labs and Tests ordered today are listed in the Patient Instructions below. °Patient Instructions  °Medication Instructions:  °Your physician recommends that you continue on your current medications as directed. Please refer to the Current Medication list given to you today. ° °*If you need a refill on your cardiac medications before your next appointment, please call your pharmacy* ° ° °Lab Work: °TODAY:  BMET  ° °If you have labs (blood work) drawn today and your tests are completely normal, you will receive your   results only by: °MyChart Message (if you have MyChart) OR °A paper copy in the mail °If you have any lab test that is abnormal or we need to change your treatment, we will call you to review the results. ° ° °Testing/Procedures: °Your physician has requested that you have a cardiac catheterization. Cardiac catheterization is used to diagnose and/or treat various heart conditions. Doctors may recommend this procedure for a number of different reasons. The most common reason is to evaluate chest pain. Chest pain can be a symptom of coronary artery disease (CAD), and cardiac catheterization can show whether plaque is narrowing or blocking your heart’s arteries. This procedure is also used to evaluate the valves, as well as measure the blood flow and oxygen levels in different parts of your heart. For further information please visit www.[email or web address].org. Please follow instruction sheet, BELOW: ° ° ° °[facility] °[facility] °[address], [address] °[place] [address] °Dept: [phone] °Loc: [phone] ° °Shandee Harris  11/29/2021 ° °You are scheduled for a Cardiac Catheterization on Friday, January 6 with Dr. Michael Cooper. ° °1. Please arrive at the [facility] (Main Entrance A) at [facility]: [address] [place],  [address] at 8:30 AM (This time is two hours  before your procedure to ensure your preparation). Free valet parking service is available.  ° °Special note: Every effort is made to have your procedure done on time. Please understand that emergencies sometimes delay scheduled procedures. ° °2. Diet: Do not eat solid foods after midnight.  The patient may have clear liquids until 5am upon the day of the procedure. ° °3. Labs: You will need to have blood drawn on  TODAY ° °4. Medication instructions in preparation for your procedure: ° ° Contrast Allergy: No ° ° °On the morning of your procedure, take your Aspirin and any morning medicines NOT listed above.  You may use sips of water. ° °5. Plan for one night stay--bring personal belongings. °6. Bring a current list of your medications and current insurance cards. °7. You MUST have a responsible person to drive you home. °8. Someone MUST be with you the first 24 hours after you arrive home or your discharge will be delayed. °9. Please wear clothes that are easy to get on and off and wear slip-on shoes. ° °Thank you for allowing us to care for you! °  -- [place] [facility] ° ° ° ° °Follow-Up: °At [facility], you and your health needs are our priority.  As part of our continuing mission to provide you with exceptional heart care, we have created designated Provider Care Teams.  These Care Teams include your primary Cardiologist (physician) and Advanced Practice Providers (APPs -  Physician Assistants and Nurse Practitioners) who all work together to provide you with the care you need, when you need it. ° °We recommend signing up for the patient portal called "MyChart".  Sign up information is provided on this After Visit Summary.  MyChart is used to connect with patients for Virtual Visits (Telemedicine).  Patients are able to view lab/test results, encounter notes, upcoming appointments, etc.  Non-urgent messages can be sent to your provider as well.   °To learn more about what you can  do with MyChart, go to [email or web address].   ° °Your next appointment:   °2 week(s)  12/19/2021 ARRIVE AT 9:05 ° °The format for your next appointment:   °In Person ° °Provider:   °  Paula Ross, MD      ° ° °Other Instructions ° °  ° °Signed, °Michele Lenze, PA-C  °11/29/2021 10:42 AM    °[facility] °[address], [place], [place]  [address] °Phone: [phone]; Fax: [phone]  ° ° °

## 2021-11-24 NOTE — Telephone Encounter (Signed)
Spoke with patient regarding recommendation of Dr Harrington Challenger and her PCP (Perini) for need of R & L heart cath to evaluate cause of her ongoing SOB. Pt agreed to plan of care and appointment has been scheduled for 11/29/21 @ 10:15 w/ [facility] (first available for our office). Pt initially stated that she will be at [facility], but upon further counseling, agreed that the sooner the better and will arrange transportation back home to attend this appointment.  Judson Roch, RN

## 2021-11-29 ENCOUNTER — Encounter: Payer: Self-pay | Admitting: Physician Assistant

## 2021-11-29 ENCOUNTER — Other Ambulatory Visit: Payer: Self-pay

## 2021-11-29 ENCOUNTER — Ambulatory Visit (INDEPENDENT_AMBULATORY_CARE_PROVIDER_SITE_OTHER): Payer: Medicare Other | Admitting: Physician Assistant

## 2021-11-29 VITALS — BP 122/74 | HR 71 | Ht 67.0 in | Wt 148.8 lb

## 2021-11-29 DIAGNOSIS — R0602 Shortness of breath: Secondary | ICD-10-CM | POA: Diagnosis not present

## 2021-11-29 DIAGNOSIS — J984 Other disorders of lung: Secondary | ICD-10-CM | POA: Diagnosis not present

## 2021-11-29 DIAGNOSIS — H3410 Central retinal artery occlusion, unspecified eye: Secondary | ICD-10-CM

## 2021-11-29 DIAGNOSIS — E7849 Other hyperlipidemia: Secondary | ICD-10-CM

## 2021-11-29 DIAGNOSIS — Z01818 Encounter for other preprocedural examination: Secondary | ICD-10-CM

## 2021-11-29 DIAGNOSIS — I712 Thoracic aortic aneurysm, without rupture, unspecified: Secondary | ICD-10-CM | POA: Diagnosis not present

## 2021-11-29 LAB — BASIC METABOLIC PANEL
BUN/Creatinine Ratio: 27 (ref 12–28)
BUN: 21 mg/dL (ref 8–27)
CO2: 22 mmol/L (ref 20–29)
Calcium: 9.3 mg/dL (ref 8.7–10.3)
Chloride: 108 mmol/L — ABNORMAL HIGH (ref 96–106)
Creatinine, Ser: 0.78 mg/dL (ref 0.57–1.00)
Glucose: 90 mg/dL (ref 70–99)
Potassium: 4.5 mmol/L (ref 3.5–5.2)
Sodium: 142 mmol/L (ref 134–144)
eGFR: 77 mL/min/{1.73_m2} (ref >59)

## 2021-11-29 NOTE — Patient Instructions (Addendum)
Medication Instructions:  Your physician recommends that you continue on your current medications as directed. Please refer to the Current Medication list given to you today.  *If you need a refill on your cardiac medications before your next appointment, please call your pharmacy*   Lab Work: TODAY:  BMET   If you have labs (blood work) drawn today and your tests are completely normal, you will receive your results only by: [place] (if you have MyChart) OR A paper copy in the mail If you have any lab test that is abnormal or we need to change your treatment, we will call you to review the results.   Testing/Procedures: Your physician has requested that you have a cardiac catheterization. Cardiac catheterization is used to diagnose and/or treat various heart conditions. Doctors may recommend this procedure for a number of different reasons. The most common reason is to evaluate chest pain. Chest pain can be a symptom of coronary artery disease (CAD), and cardiac catheterization can show whether plaque is narrowing or blocking your hearts arteries. This procedure is also used to evaluate the valves, as well as measure the blood flow and oxygen levels in different parts of your heart. For further information please visit [email or web address]. Please follow instruction sheet, BELOW:    [facility] [place], [place] [address] Dept: [phone] Loc: [place]  11/29/2021  You are scheduled for a Cardiac Catheterization on Friday, January 6 with Dr. Sherren Mocha.  1. Please arrive at the [facility] (Main Entrance A) at [facility]: [address] [place], [place] [address] at 8:30 AM (This time is two hours before your procedure to ensure your preparation). Free valet parking service is available.   Special note: Every effort is made to have your  procedure done on time. Please understand that emergencies sometimes delay scheduled procedures.  2. Diet: Do not eat solid foods after midnight.  The patient may have clear liquids until 5am upon the day of the procedure.  3. Labs: You will need to have blood drawn on  TODAY  4. Medication instructions in preparation for your procedure:   Contrast Allergy: No   On the morning of your procedure, take your Aspirin and any morning medicines NOT listed above.  You may use sips of water.  5. Plan for one night stay--bring personal belongings. 6. Bring a current list of your medications and current insurance cards. 7. You MUST have a responsible person to drive you home. 8. Someone MUST be with you the first 24 hours after you arrive home or your discharge will be delayed. 9. Please wear clothes that are easy to get on and off and wear slip-on shoes.  Thank you for allowing [place] to care for you!   -- [place] [facility]     Follow-Up: At [facility], you and your health needs are our priority.  As part of our continuing mission to provide you with exceptional heart care, we have created designated Provider Care Teams.  These Care Teams include your primary Cardiologist (physician) and Advanced Practice Providers (APPs -  Physician Assistants and Nurse Practitioners) who all work together to provide you with the care you need, when you need it.  We recommend signing up for the patient portal called "MyChart".  Sign up information is provided on this After Visit Summary.  MyChart is used to connect with patients for  Virtual Visits (Telemedicine).  Patients are able to view lab/test results, encounter notes, upcoming appointments, etc.  Non-urgent messages can be sent to your provider as well.   To learn more about what you can do with MyChart, go to [email or web address].    Your next appointment:   2 week(s)  12/19/2021 ARRIVE AT 9:05  The format for your next  appointment:   In Person  Provider:   Dorris Carnes, MD        Other Instructions

## 2021-11-30 ENCOUNTER — Telehealth: Payer: Self-pay | Admitting: *Deleted

## 2021-11-30 NOTE — Telephone Encounter (Signed)
11/22/21 CBC results in Epic: Encounters>11/26/21 Unknown (Iron deficiency anemia-Dr Perrini)> [facility] Continuity of Care document>scroll down for results. (Hgb 11/Platelet 460)

## 2021-11-30 NOTE — Telephone Encounter (Signed)
Cardiac catheterization scheduled at [facility] for: Friday December 01, 2021 10:30 AM [facility] Main Entrance A [facility]) at: 8:30 AM   Diet-no solid food after midnight prior to cath, clear liquids until 5 AM day of procedure.  Medication instructions for procedure: -Usual morning medications can be taken pre-cath with sips of water including aspirin 81 mg.    Confirmed patient has responsible adult to drive home post procedure and be with patient first 24 hours after arriving home.  [facility] does allow one visitor to accompany you and wait in the hospital waiting room while you are there for your procedure. You and your visitor will be asked to wear a mask once you enter the hospital.   Patient reports does not currently have any new symptoms concerning for COVID-19 and no household members with COVID-19 like illness.     Reviewed procedure/mask/visitor instructions with patient.

## 2021-12-01 ENCOUNTER — Ambulatory Visit (HOSPITAL_COMMUNITY)
Admission: RE | Disposition: A | Discharge status: Home / Self Care | Payer: Self-pay | Source: Home / Self Care | Attending: Cardiovascular Disease

## 2021-12-01 ENCOUNTER — Telehealth: Payer: Self-pay

## 2021-12-01 ENCOUNTER — Other Ambulatory Visit: Payer: Self-pay

## 2021-12-01 ENCOUNTER — Ambulatory Visit (HOSPITAL_COMMUNITY)
Admission: RE | Admit: 2021-12-01 | Discharge: 2021-12-01 | Disposition: A | Discharge status: Home / Self Care | Payer: Medicare Other | Attending: Cardiovascular Disease | Admitting: Cardiovascular Disease

## 2021-12-01 DIAGNOSIS — I2510 Atherosclerotic heart disease of native coronary artery without angina pectoris: Secondary | ICD-10-CM | POA: Diagnosis not present

## 2021-12-01 DIAGNOSIS — I712 Thoracic aortic aneurysm, without rupture, unspecified: Secondary | ICD-10-CM | POA: Diagnosis not present

## 2021-12-01 DIAGNOSIS — H3410 Central retinal artery occlusion, unspecified eye: Secondary | ICD-10-CM | POA: Insufficient documentation

## 2021-12-01 DIAGNOSIS — E7849 Other hyperlipidemia: Secondary | ICD-10-CM | POA: Diagnosis not present

## 2021-12-01 DIAGNOSIS — Z01818 Encounter for other preprocedural examination: Secondary | ICD-10-CM

## 2021-12-01 DIAGNOSIS — J984 Other disorders of lung: Secondary | ICD-10-CM | POA: Insufficient documentation

## 2021-12-01 DIAGNOSIS — R0602 Shortness of breath: Secondary | ICD-10-CM | POA: Diagnosis not present

## 2021-12-01 HISTORY — PX: RIGHT HEART CATH AND CORONARY ANGIOGRAPHY: CATH118264

## 2021-12-01 LAB — POCT I-STAT EG7
Acid-base deficit: 4 mmol/L — ABNORMAL HIGH (ref 0.0–2.0)
Bicarbonate: 21.6 mmol/L (ref 20.0–28.0)
Calcium, Ion: 1.18 mmol/L (ref 1.15–1.40)
HCT: 28 % — ABNORMAL LOW (ref 36.0–46.0)
Hemoglobin: 9.5 g/dL — ABNORMAL LOW (ref 12.0–15.0)
O2 Saturation: 70 %
Potassium: 3.3 mmol/L — ABNORMAL LOW (ref 3.5–5.1)
Sodium: 142 mmol/L (ref 135–145)
TCO2: 23 mmol/L (ref 22–32)
pCO2, Ven: 38.8 mmHg — ABNORMAL LOW (ref 44.0–60.0)
pH, Ven: 7.355 (ref 7.250–7.430)
pO2, Ven: 38 mmHg (ref 32.0–45.0)

## 2021-12-01 LAB — CBC
HCT: 35.8 % — ABNORMAL LOW (ref 36.0–46.0)
Hemoglobin: 10.7 g/dL — ABNORMAL LOW (ref 12.0–15.0)
MCH: 23.4 pg — ABNORMAL LOW (ref 26.0–34.0)
MCHC: 29.9 g/dL — ABNORMAL LOW (ref 30.0–36.0)
MCV: 78.2 fL — ABNORMAL LOW (ref 80.0–100.0)
Platelets: 423 10*3/uL — ABNORMAL HIGH (ref 150–400)
RBC: 4.58 MIL/uL (ref 3.87–5.11)
RDW: 18 % — ABNORMAL HIGH (ref 11.5–15.5)
WBC: 5.7 10*3/uL (ref 4.0–10.5)
nRBC: 0 % (ref 0.0–0.2)

## 2021-12-01 LAB — POCT I-STAT 7, (LYTES, BLD GAS, ICA,H+H)
Acid-base deficit: 4 mmol/L — ABNORMAL HIGH (ref 0.0–2.0)
Bicarbonate: 20.9 mmol/L (ref 20.0–28.0)
Calcium, Ion: 1.13 mmol/L — ABNORMAL LOW (ref 1.15–1.40)
HCT: 27 % — ABNORMAL LOW (ref 36.0–46.0)
Hemoglobin: 9.2 g/dL — ABNORMAL LOW (ref 12.0–15.0)
O2 Saturation: 97 %
Potassium: 3.3 mmol/L — ABNORMAL LOW (ref 3.5–5.1)
Sodium: 143 mmol/L (ref 135–145)
TCO2: 22 mmol/L (ref 22–32)
pCO2 arterial: 35.7 mmHg (ref 32.0–48.0)
pH, Arterial: 7.376 (ref 7.350–7.450)
pO2, Arterial: 95 mmHg (ref 83.0–108.0)

## 2021-12-01 SURGERY — RIGHT HEART CATH AND CORONARY ANGIOGRAPHY
Anesthesia: LOCAL

## 2021-12-01 MED ORDER — SODIUM CHLORIDE 0.9 % WEIGHT BASED INFUSION: 1.0000 mL/kg/h | INTRAVENOUS | Status: DC

## 2021-12-01 MED ORDER — LIDOCAINE HCL (PF) 1 % IJ SOLN
INTRAMUSCULAR | Status: AC
  Filled 2021-12-01: qty 30

## 2021-12-01 MED ORDER — SODIUM CHLORIDE 0.9 % IV SOLN: 250.0000 mL | INTRAVENOUS | Status: DC | PRN

## 2021-12-01 MED ORDER — SODIUM CHLORIDE 0.9% FLUSH: 3.0000 mL | Freq: Two times a day (BID) | INTRAVENOUS | Status: DC

## 2021-12-01 MED ORDER — IOHEXOL 350 MG/ML SOLN
INTRAVENOUS | Status: DC | PRN
  Administered 2021-12-01: 40 mL

## 2021-12-01 MED ORDER — LABETALOL HCL 5 MG/ML IV SOLN: 10.0000 mg | INTRAVENOUS | Status: DC | PRN

## 2021-12-01 MED ORDER — MIDAZOLAM HCL 2 MG/2ML IJ SOLN
INTRAMUSCULAR | Status: DC | PRN
  Administered 2021-12-01 (×2): 1 mg via INTRAVENOUS

## 2021-12-01 MED ORDER — VERAPAMIL HCL 2.5 MG/ML IV SOLN
INTRAVENOUS | Status: AC
  Filled 2021-12-01: qty 2

## 2021-12-01 MED ORDER — SODIUM CHLORIDE 0.9% FLUSH: 3.0000 mL | INTRAVENOUS | Status: DC | PRN

## 2021-12-01 MED ORDER — LIDOCAINE HCL (PF) 1 % IJ SOLN
INTRAMUSCULAR | Status: DC | PRN
  Administered 2021-12-01: 3 mL

## 2021-12-01 MED ORDER — MIDAZOLAM HCL 2 MG/2ML IJ SOLN
INTRAMUSCULAR | Status: AC
  Filled 2021-12-01: qty 2

## 2021-12-01 MED ORDER — HEPARIN (PORCINE) IN NACL 1000-0.9 UT/500ML-% IV SOLN
INTRAVENOUS | Status: DC | PRN
  Administered 2021-12-01 (×2): 500 mL

## 2021-12-01 MED ORDER — FENTANYL CITRATE (PF) 100 MCG/2ML IJ SOLN
INTRAMUSCULAR | Status: DC | PRN
  Administered 2021-12-01 (×2): 25 ug via INTRAVENOUS

## 2021-12-01 MED ORDER — FENTANYL CITRATE (PF) 100 MCG/2ML IJ SOLN
INTRAMUSCULAR | Status: AC
  Filled 2021-12-01: qty 2

## 2021-12-01 MED ORDER — ONDANSETRON HCL 4 MG/2ML IJ SOLN: 4.0000 mg | Freq: Four times a day (QID) | INTRAMUSCULAR | Status: DC | PRN

## 2021-12-01 MED ORDER — ACETAMINOPHEN 325 MG PO TABS: 650.0000 mg | ORAL_TABLET | ORAL | Status: DC | PRN

## 2021-12-01 MED ORDER — HEPARIN SODIUM (PORCINE) 1000 UNIT/ML IJ SOLN
INTRAMUSCULAR | Status: AC
  Filled 2021-12-01: qty 10

## 2021-12-01 MED ORDER — SODIUM CHLORIDE 0.9 % WEIGHT BASED INFUSION
3.0000 mL/kg/h | INTRAVENOUS | Status: AC
  Administered 2021-12-01: 3 mL/kg/h via INTRAVENOUS

## 2021-12-01 MED ORDER — VERAPAMIL HCL 2.5 MG/ML IV SOLN
INTRAVENOUS | Status: DC | PRN
  Administered 2021-12-01: 10 mL via INTRA_ARTERIAL

## 2021-12-01 MED ORDER — ASPIRIN 81 MG PO CHEW: 81.0000 mg | CHEWABLE_TABLET | ORAL | Status: DC

## 2021-12-01 MED ORDER — HYDRALAZINE HCL 20 MG/ML IJ SOLN: 10.0000 mg | INTRAMUSCULAR | Status: DC | PRN

## 2021-12-01 MED ORDER — HEPARIN SODIUM (PORCINE) 1000 UNIT/ML IJ SOLN
INTRAMUSCULAR | Status: DC | PRN
  Administered 2021-12-01: 3500 [IU] via INTRAVENOUS

## 2021-12-01 SURGICAL SUPPLY — 14 items
CATH 5FR JL3.5 JR4 ANG PIG MP (CATHETERS) ×1 IMPLANT
CATH BALLN WEDGE 5F 110CM (CATHETERS) ×1 IMPLANT
CATH LAUNCHER 5F NOTO (CATHETERS) IMPLANT
CATHETER LAUNCHER 5F NOTO (CATHETERS) ×2
DEVICE RAD TR BAND REGULAR (VASCULAR PRODUCTS) ×1 IMPLANT
GLIDESHEATH SLEND SS 6F .021 (SHEATH) ×1 IMPLANT
GUIDEWIRE .025 260CM (WIRE) ×1 IMPLANT
GUIDEWIRE INQWIRE 1.5J.035X260 (WIRE) IMPLANT
INQWIRE 1.5J .035X260CM (WIRE) ×2
KIT HEART LEFT (KITS) ×2 IMPLANT
PACK CARDIAC CATHETERIZATION (CUSTOM PROCEDURE TRAY) ×3 IMPLANT
SHEATH GLIDE SLENDER 4/5FR (SHEATH) ×1 IMPLANT
TRANSDUCER W/STOPCOCK (MISCELLANEOUS) ×3 IMPLANT
TUBING CIL FLEX 10 FLL-RA (TUBING) ×2 IMPLANT

## 2021-12-01 NOTE — Progress Notes (Signed)
Client called me to room and right radial site bleeding and small amt swelling; pressure held and no further bleeding or hematoma; added 3cc to tr band

## 2021-12-01 NOTE — Progress Notes (Signed)
Pt had cath today   Minimal CAD   NOrmal R heart pressures  F/U with M Perini REcomm:   Walk, increase aerobic activity LDL needs tighter control   Please get lipids from M Perini     Cx appt at end of Jan    Make appt for August

## 2021-12-01 NOTE — Telephone Encounter (Signed)
-----   Message from Fay Records, MD sent at 12/01/2021 12:59 PM EST -----    ----- Message ----- From: Sherren Mocha, MD Sent: 12/01/2021  10:26 AM EST To: Fay Records, MD  Francetta Found - her cath is clean, hemodynamics look great with low filling pressures. No cardiac source of DOE.   Thx Ronalee Belts

## 2021-12-01 NOTE — Discharge Instructions (Signed)

## 2021-12-01 NOTE — Telephone Encounter (Signed)
Pt had cath today   Minimal CAD   Normal R heart pressures   F/U with M Perini REcomm:   Walk, increase aerobic activity LDL needs tighter control   Please get lipids from M Perini      Cx appt at end of Jan    Make appt for August   Will send the pt a My Chart and will call her Monday 12/04/21 after she has recovered from her sedation.

## 2021-12-01 NOTE — Interval H&P Note (Signed)
History and Physical Interval Note:  12/01/2021 9:24 AM  Monica White  has presented today for surgery, with the diagnosis of shortness of breath.  The various methods of treatment have been discussed with the patient and family. After consideration of risks, benefits and other options for treatment, the patient has consented to  Procedure(s): RIGHT/LEFT HEART CATH AND CORONARY ANGIOGRAPHY (N/A) as a surgical intervention.  The patient's history has been reviewed, patient examined, no change in status, stable for surgery.  I have reviewed the patient's chart and labs.  Questions were answered to the patient's satisfaction.     Sherren Mocha

## 2021-12-04 ENCOUNTER — Encounter (HOSPITAL_COMMUNITY): Payer: Self-pay | Admitting: Cardiovascular Disease

## 2021-12-04 MED FILL — Verapamil HCl IV Soln 2.5 MG/ML: INTRAVENOUS | Qty: 2 | Status: AC

## 2021-12-04 NOTE — Telephone Encounter (Signed)
Pt advised and Lab requested from Dr. Joylene Draft... she says she had a physical in 04/2021 with him and had labs... she says she has not been taking her Zocor but has restarted since her cath and will wait to hear back from Dr. Harrington Challenger ro determine if she needs to stay on it or to make any changes.   Recall placed for 06/2022 return OV.

## 2021-12-07 ENCOUNTER — Ambulatory Visit (INDEPENDENT_AMBULATORY_CARE_PROVIDER_SITE_OTHER): Payer: Medicare Other | Admitting: Pulmonary Disease

## 2021-12-07 ENCOUNTER — Encounter: Payer: Self-pay | Admitting: Pulmonary Disease

## 2021-12-07 ENCOUNTER — Other Ambulatory Visit: Payer: Self-pay

## 2021-12-07 VITALS — BP 104/72 | HR 77 | Temp 97.8°F | Ht 67.0 in | Wt 149.4 lb

## 2021-12-07 DIAGNOSIS — R0609 Other forms of dyspnea: Secondary | ICD-10-CM | POA: Diagnosis not present

## 2021-12-07 DIAGNOSIS — F419 Anxiety disorder, unspecified: Secondary | ICD-10-CM | POA: Diagnosis not present

## 2021-12-07 DIAGNOSIS — D509 Iron deficiency anemia, unspecified: Secondary | ICD-10-CM

## 2021-12-07 DIAGNOSIS — R0602 Shortness of breath: Secondary | ICD-10-CM

## 2021-12-07 NOTE — Patient Instructions (Signed)
Thank you for visiting Dr. Valeta Harms at [facility]. Today we recommend the following:  Return in about 1 year (around 12/07/2022), or if symptoms worsen or fail to improve.    Please do your part to reduce the spread of COVID-19.

## 2021-12-07 NOTE — Progress Notes (Signed)
Synopsis: Referred in Oct 2020 for SOB by Crist Infante, MD  Subjective:   PATIENT ID: Monica White GENDER: female DOB: 1941-08-22, MRN: [id number]  Chief Complaint  Patient presents with   Follow-up    Patient says nothing is better.     PMH high cholesterol, hypothyroid, referred for SOB. Life long non-smoker. Unfortunately husband passed this past July (my former patient Mosetta Putt). She notices that she feels SOB when she gets up and goes to do simple tasks. She has been playing golf and feels short of breath. Playing golf was the first time she noticed it over a year ago. Dealing with her hisband she ignored her symptoms. She went on cruise with family this year and felt SOB and DOE with exertion. Recently saw stress test and echo which were reassuring.   OV 12/07/2021: Here today for follow-up regarding shortness of breath.  Recently diagnosed with iron deficiency anemia, stool studies completed ferritin of 11.  Has an appointment to see gastroenterology currently on iron supplementation with primary care.  She had a cardiac work-up which was relatively negative with a cardiac catheterization her CT chest reveals small subcentimeter less than 4 mm lung nodules.  Also has a large hiatal hernia.  PFTs also had normal spirometry but she does have a reduced DLCO.   Past Medical History:  Diagnosis Date   Hypercholesteremia    Hypothyroid 2015     Family History  Problem Relation Age of Onset   Heart disease Mother      Past Surgical History:  Procedure Laterality Date   RIGHT HEART CATH AND CORONARY ANGIOGRAPHY N/A 12/01/2021   Procedure: RIGHT HEART CATH AND CORONARY ANGIOGRAPHY;  Surgeon: Sherren Mocha, MD;  Location: [place] [facility];  Service: Cardiovascular;  Laterality: N/A;   TUBAL LIGATION  1985    Social History   Socioeconomic History   Marital status: Widowed    Spouse name: Not on file   Number of children: Not on file   Years of education: Not on file   Highest  education level: Not on file  Occupational History   Not on file  Tobacco Use   Smoking status: Never   Smokeless tobacco: Never  Vaping Use   Vaping Use: Never used  Substance and Sexual Activity   Alcohol use: Yes    Alcohol/week: 1.0 - 2.0 standard drink    Types: 1 - 2 Standard drinks or equivalent per week   Drug use: No   Sexual activity: Yes    Partners: Male    Birth control/protection: Post-menopausal, Surgical    Comment: Tubal  Other Topics Concern   Not on file  Social History Narrative   Not on file   Social Determinants of Health   Financial Resource Strain: Not on file  Food Insecurity: Not on file  Transportation Needs: Not on file  Physical Activity: Not on file  Stress: Not on file  Social Connections: Not on file  Intimate Partner Violence: Not on file     Allergies  Allergen Reactions   Atorvastatin Other (See Comments)    Stiffness   Escitalopram Other (See Comments)    Did not like medication   Pitavastatin Other (See Comments)    Stiffness   Tramadol Other (See Comments)    Did not like medication     Outpatient Medications Prior to Visit  Medication Sig Dispense Refill   albuterol (VENTOLIN HFA) 108 (90 Base) MCG/ACT inhaler Inhale 1-2 puffs into the lungs every  6 (six) hours as needed. 8 g 2   aspirin 325 MG tablet Take 1 tablet (325 mg total) by mouth daily. 30 tablet 3   Cholecalciferol (VITAMIN D PO) Take 1 capsule by mouth daily.     Cyanocobalamin (B-12) 1000 MCG CAPS Take by mouth.     estropipate (ORTHO-EST 0.625) 0.75 MG tablet Take 1 tablet (0.75 mg total) by mouth daily. (Patient taking differently: Take 0.75 mg by mouth at bedtime.) 16 tablet 0   ferrous sulfate 325 (65 FE) MG tablet Take 325 mg by mouth daily with breakfast.     levothyroxine (SYNTHROID, LEVOTHROID) 50 MCG tablet Take 50 mcg by mouth daily.  11   medroxyPROGESTERone (PROVERA) 2.5 MG tablet Take 1 tablet (2.5 mg total) by mouth daily. 90 tablet 3   Multiple  Vitamins-Minerals (MULTIVITAMIN PO) Take 1 tablet by mouth daily.     Omega-3 Fatty Acids (FISH OIL PO) Take 1 tablet by mouth daily.     simvastatin (ZOCOR) 20 MG tablet Take 20 mg by mouth at bedtime.     zolpidem (AMBIEN) 5 MG tablet Take 5 mg by mouth at bedtime as needed for sleep.     No facility-administered medications prior to visit.    Review of Systems  Constitutional:  Negative for chills, fever, malaise/fatigue and weight loss.  HENT:  Negative for hearing loss, sore throat and tinnitus.   Eyes:  Negative for blurred vision and double vision.  Respiratory:  Positive for shortness of breath. Negative for cough, hemoptysis, sputum production, wheezing and stridor.   Cardiovascular:  Negative for chest pain, palpitations, orthopnea, leg swelling and PND.  Gastrointestinal:  Negative for abdominal pain, constipation, diarrhea, heartburn, nausea and vomiting.  Genitourinary:  Negative for dysuria, hematuria and urgency.  Musculoskeletal:  Negative for joint pain and myalgias.  Skin:  Negative for itching and rash.  Neurological:  Negative for dizziness, tingling, weakness and headaches.  Endo/Heme/Allergies:  Negative for environmental allergies. Does not bruise/bleed easily.  Psychiatric/Behavioral:  Negative for depression. The patient is not nervous/anxious and does not have insomnia.   All other systems reviewed and are negative.   Objective:  Physical Exam Vitals reviewed.  Constitutional:      General: She is not in acute distress.    Appearance: She is well-developed.  HENT:     Head: Normocephalic and atraumatic.  Eyes:     General: No scleral icterus.    Conjunctiva/sclera: Conjunctivae normal.     Pupils: Pupils are equal, round, and reactive to light.  Neck:     Vascular: No JVD.     Trachea: No tracheal deviation.  Cardiovascular:     Rate and Rhythm: Normal rate and regular rhythm.     Heart sounds: Normal heart sounds. No murmur heard. Pulmonary:      Effort: Pulmonary effort is normal. No tachypnea, accessory muscle usage or respiratory distress.     Breath sounds: No stridor. No wheezing, rhonchi or rales.  Abdominal:     General: Bowel sounds are normal. There is no distension.     Palpations: Abdomen is soft.     Tenderness: There is no abdominal tenderness.  Musculoskeletal:        General: No tenderness.     Cervical back: Neck supple.  Lymphadenopathy:     Cervical: No cervical adenopathy.  Skin:    General: Skin is warm and dry.     Capillary Refill: Capillary refill takes less than 2 seconds.  Findings: No rash.  Neurological:     Mental Status: She is alert and oriented to person, place, and time.  Psychiatric:        Behavior: Behavior normal.     Vitals:   12/07/21 1200  BP: 104/72  Pulse: 77  Temp: 97.8 F (36.6 C)  TempSrc: Oral  SpO2: 98%  Weight: 149 lb 6.4 oz (67.8 kg)  Height: 5\' 7"  (1.702 m)   98% on RA BMI Readings from Last 3 Encounters:  12/07/21 23.40 kg/m  12/01/21 23.18 kg/m  11/29/21 23.31 kg/m   Wt Readings from Last 3 Encounters:  12/07/21 149 lb 6.4 oz (67.8 kg)  12/01/21 148 lb (67.1 kg)  11/29/21 148 lb 12.8 oz (67.5 kg)     CBC    Component Value Date/Time   WBC 5.7 12/01/2021 0843   RBC 4.58 12/01/2021 0843   HGB 9.2 (L) 12/01/2021 0958   HCT 27.0 (L) 12/01/2021 0958   PLT 423 (H) 12/01/2021 0843   MCV 78.2 (L) 12/01/2021 0843   MCH 23.4 (L) 12/01/2021 0843   MCHC 29.9 (L) 12/01/2021 0843   RDW 18.0 (H) 12/01/2021 0843   LYMPHSABS 2.1 09/19/2017 1820   MONOABS 0.4 09/19/2017 1820   EOSABS 0.3 09/19/2017 1820   BASOSABS 0.1 09/19/2017 1820    Chest Imaging: 10/09/2018 CT chest: Lung parenchyma appears relatively normal on imaging. Does have a hiatal hernia at the time. The patient's images have been independently reviewed by me.    11/09/2021 HRCT: No evidence of ILD, small less than 4 mm lung nodules, large hiatal hernia The patient's images have been  independently reviewed by me.    Pulmonary Functions Testing Results: PFT Results Latest Ref Rng & Units 10/17/2021  FVC-Pre L 2.31  FVC-Predicted Pre % 77  FVC-Post L 2.36  FVC-Predicted Post % 78  Pre FEV1/FVC % % 77  Post FEV1/FCV % % 79  FEV1-Pre L 1.77  FEV1-Predicted Pre % 79  FEV1-Post L 1.87  DLCO uncorrected ml/min/mmHg 10.95  DLCO UNC% % 52  DLCO corrected ml/min/mmHg 10.95  DLCO COR %Predicted % 52  DLVA Predicted % 82  TLC L 3.91  TLC % Predicted % 71  RV % Predicted % 64    FeNO:   Pathology:   Echocardiogram:   08/24/2021: IMPRESSIONS   1. Left ventricular ejection fraction, by estimation, is 60 to 65%. The  left ventricle has normal function. The left ventricle has no regional  wall motion abnormalities. Left ventricular diastolic parameters are  consistent with Grade I diastolic  dysfunction (impaired relaxation). The average left ventricular global  longitudinal strain is -17.6 %.   2. Right ventricular systolic function is normal. The right ventricular  size is normal. There is normal pulmonary artery systolic pressure.   3. The mitral valve is normal in structure. No evidence of mitral valve  regurgitation. No evidence of mitral stenosis.   4. Tricuspid valve regurgitation is moderate.   5. The aortic valve is normal in structure. Aortic valve regurgitation is  mild to moderate. No aortic stenosis is present.   6. The inferior vena cava is normal in size with greater than 50%  respiratory variability, suggesting right atrial pressure of 3 mmHg.   Heart Catheterization:     Assessment & Plan:     ICD-10-CM   1. Shortness of breath  R06.02     2. DOE (dyspnea on exertion)  R06.09     3. Anxiety  F41.9  4. Iron deficiency anemia, unspecified iron deficiency anemia type  D50.9       Discussion:  This is an 81 year old female, no complaints of shortness of breath and dyspnea on exertion.  Definitely has some ongoing anxiety, depression  issues related to loss of her husband.  She is slowly getting over this.  During this work-up process has had PFTs HRCT imaging as well as cardiac work-up.  Cardiac work-up has been reassuring.  Her DLCO is low and she did have an HRCT with no evidence of ILD.  Prior cardiac echocardiogram with normal RV function.  Plan: I think that her anemia could be playing a role in her shortness of breath with exertion.  She has this work-up already being completed by primary care and GI. No additional need for follow-up of the small pulmonary nodules that she is a non-smoker also 81 years old they are all less than 4 mm in size. Reassurance given today in the office as well. I can tell that her anxiety of living alone and depressive state related to loss of her husband is playing a role in this situation as well.  We talked about this today in the office.  She is on follow-up with me as needed or in 1 year.  Continue follow-up with primary care as they complete work-up for anemia.   Current Outpatient Medications:    albuterol (VENTOLIN HFA) 108 (90 Base) MCG/ACT inhaler, Inhale 1-2 puffs into the lungs every 6 (six) hours as needed., Disp: 8 g, Rfl: 2   aspirin 325 MG tablet, Take 1 tablet (325 mg total) by mouth daily., Disp: 30 tablet, Rfl: 3   Cholecalciferol (VITAMIN D PO), Take 1 capsule by mouth daily., Disp: , Rfl:    Cyanocobalamin (B-12) 1000 MCG CAPS, Take by mouth., Disp: , Rfl:    estropipate (ORTHO-EST 0.625) 0.75 MG tablet, Take 1 tablet (0.75 mg total) by mouth daily. (Patient taking differently: Take 0.75 mg by mouth at bedtime.), Disp: 16 tablet, Rfl: 0   ferrous sulfate 325 (65 FE) MG tablet, Take 325 mg by mouth daily with breakfast., Disp: , Rfl:    levothyroxine (SYNTHROID, LEVOTHROID) 50 MCG tablet, Take 50 mcg by mouth daily., Disp: , Rfl: 11   medroxyPROGESTERone (PROVERA) 2.5 MG tablet, Take 1 tablet (2.5 mg total) by mouth daily., Disp: 90 tablet, Rfl: 3   Multiple  Vitamins-Minerals (MULTIVITAMIN PO), Take 1 tablet by mouth daily., Disp: , Rfl:    Omega-3 Fatty Acids (FISH OIL PO), Take 1 tablet by mouth daily., Disp: , Rfl:    simvastatin (ZOCOR) 20 MG tablet, Take 20 mg by mouth at bedtime., Disp: , Rfl:    zolpidem (AMBIEN) 5 MG tablet, Take 5 mg by mouth at bedtime as needed for sleep., Disp: , Rfl:    Garner Nash, DO [facility] 12/07/2021 12:06 PM

## 2021-12-11 DIAGNOSIS — K921 Melena: Secondary | ICD-10-CM | POA: Diagnosis not present

## 2021-12-14 DIAGNOSIS — M6281 Muscle weakness (generalized): Secondary | ICD-10-CM | POA: Diagnosis not present

## 2021-12-14 DIAGNOSIS — M6289 Other specified disorders of muscle: Secondary | ICD-10-CM | POA: Diagnosis not present

## 2021-12-19 ENCOUNTER — Ambulatory Visit: Payer: Medicare Other | Admitting: Internal Medicine

## 2021-12-19 DIAGNOSIS — M6289 Other specified disorders of muscle: Secondary | ICD-10-CM | POA: Diagnosis not present

## 2021-12-19 DIAGNOSIS — M6281 Muscle weakness (generalized): Secondary | ICD-10-CM | POA: Diagnosis not present

## 2021-12-25 DIAGNOSIS — D509 Iron deficiency anemia, unspecified: Secondary | ICD-10-CM | POA: Diagnosis not present

## 2021-12-25 DIAGNOSIS — E039 Hypothyroidism, unspecified: Secondary | ICD-10-CM | POA: Diagnosis not present

## 2021-12-25 DIAGNOSIS — E785 Hyperlipidemia, unspecified: Secondary | ICD-10-CM | POA: Diagnosis not present

## 2021-12-28 DIAGNOSIS — D509 Iron deficiency anemia, unspecified: Secondary | ICD-10-CM | POA: Diagnosis not present

## 2021-12-28 DIAGNOSIS — R195 Other fecal abnormalities: Secondary | ICD-10-CM | POA: Diagnosis not present

## 2021-12-28 DIAGNOSIS — R0602 Shortness of breath: Secondary | ICD-10-CM | POA: Diagnosis not present

## 2021-12-28 DIAGNOSIS — K449 Diaphragmatic hernia without obstruction or gangrene: Secondary | ICD-10-CM | POA: Diagnosis not present

## 2021-12-29 DIAGNOSIS — M6289 Other specified disorders of muscle: Secondary | ICD-10-CM | POA: Diagnosis not present

## 2021-12-29 DIAGNOSIS — M6281 Muscle weakness (generalized): Secondary | ICD-10-CM | POA: Diagnosis not present

## 2022-01-04 DIAGNOSIS — M6281 Muscle weakness (generalized): Secondary | ICD-10-CM | POA: Diagnosis not present

## 2022-01-04 DIAGNOSIS — M6289 Other specified disorders of muscle: Secondary | ICD-10-CM | POA: Diagnosis not present

## 2022-01-05 DIAGNOSIS — Q399 Congenital malformation of esophagus, unspecified: Secondary | ICD-10-CM | POA: Diagnosis not present

## 2022-01-05 DIAGNOSIS — K449 Diaphragmatic hernia without obstruction or gangrene: Secondary | ICD-10-CM | POA: Diagnosis not present

## 2022-01-05 DIAGNOSIS — R195 Other fecal abnormalities: Secondary | ICD-10-CM | POA: Diagnosis not present

## 2022-01-05 DIAGNOSIS — K2930 Chronic superficial gastritis without bleeding: Secondary | ICD-10-CM | POA: Diagnosis not present

## 2022-01-05 DIAGNOSIS — D509 Iron deficiency anemia, unspecified: Secondary | ICD-10-CM | POA: Diagnosis not present

## 2022-01-05 DIAGNOSIS — K269 Duodenal ulcer, unspecified as acute or chronic, without hemorrhage or perforation: Secondary | ICD-10-CM | POA: Diagnosis not present

## 2022-01-11 DIAGNOSIS — Z85828 Personal history of other malignant neoplasm of skin: Secondary | ICD-10-CM | POA: Diagnosis not present

## 2022-01-11 DIAGNOSIS — L578 Other skin changes due to chronic exposure to nonionizing radiation: Secondary | ICD-10-CM | POA: Diagnosis not present

## 2022-01-11 DIAGNOSIS — L570 Actinic keratosis: Secondary | ICD-10-CM | POA: Diagnosis not present

## 2022-01-11 DIAGNOSIS — K2930 Chronic superficial gastritis without bleeding: Secondary | ICD-10-CM | POA: Diagnosis not present

## 2022-01-11 DIAGNOSIS — D3612 Benign neoplasm of peripheral nerves and autonomic nervous system, upper limb, including shoulder: Secondary | ICD-10-CM | POA: Diagnosis not present

## 2022-01-18 DIAGNOSIS — D500 Iron deficiency anemia secondary to blood loss (chronic): Secondary | ICD-10-CM | POA: Diagnosis not present

## 2022-01-18 DIAGNOSIS — R0602 Shortness of breath: Secondary | ICD-10-CM | POA: Diagnosis not present

## 2022-01-18 DIAGNOSIS — K449 Diaphragmatic hernia without obstruction or gangrene: Secondary | ICD-10-CM | POA: Diagnosis not present

## 2022-01-18 DIAGNOSIS — K253 Acute gastric ulcer without hemorrhage or perforation: Secondary | ICD-10-CM | POA: Diagnosis not present

## 2022-01-24 NOTE — Telephone Encounter (Signed)
Second request for labs/ lipids refaxed to Dr. Silvestre Mesi  office.  ?

## 2022-01-25 DIAGNOSIS — M6289 Other specified disorders of muscle: Secondary | ICD-10-CM | POA: Diagnosis not present

## 2022-01-25 DIAGNOSIS — M6281 Muscle weakness (generalized): Secondary | ICD-10-CM | POA: Diagnosis not present

## 2022-01-29 DIAGNOSIS — R0602 Shortness of breath: Secondary | ICD-10-CM | POA: Diagnosis not present

## 2022-01-29 DIAGNOSIS — D509 Iron deficiency anemia, unspecified: Secondary | ICD-10-CM | POA: Diagnosis not present

## 2022-01-29 DIAGNOSIS — K259 Gastric ulcer, unspecified as acute or chronic, without hemorrhage or perforation: Secondary | ICD-10-CM | POA: Diagnosis not present

## 2022-01-29 DIAGNOSIS — K449 Diaphragmatic hernia without obstruction or gangrene: Secondary | ICD-10-CM | POA: Diagnosis not present

## 2022-01-29 DIAGNOSIS — K269 Duodenal ulcer, unspecified as acute or chronic, without hemorrhage or perforation: Secondary | ICD-10-CM | POA: Diagnosis not present

## 2022-02-13 NOTE — Progress Notes (Signed)
Sent message, via epic in basket, requesting orders in epic from surgeon.  

## 2022-02-14 DIAGNOSIS — M1711 Unilateral primary osteoarthritis, right knee: Secondary | ICD-10-CM | POA: Diagnosis not present

## 2022-02-14 DIAGNOSIS — M1712 Unilateral primary osteoarthritis, left knee: Secondary | ICD-10-CM | POA: Diagnosis not present

## 2022-02-15 ENCOUNTER — Ambulatory Visit: Payer: Self-pay | Admitting: General Surgery

## 2022-02-15 DIAGNOSIS — M6281 Muscle weakness (generalized): Secondary | ICD-10-CM | POA: Diagnosis not present

## 2022-02-15 DIAGNOSIS — M6289 Other specified disorders of muscle: Secondary | ICD-10-CM | POA: Diagnosis not present

## 2022-02-20 NOTE — Patient Instructions (Addendum)
DUE TO COVID-19 ONLY ONE VISITOR  (aged 81 and older)  IS ALLOWED TO COME WITH YOU AND STAY IN THE WAITING ROOM ONLY DURING PRE OP AND PROCEDURE.   ?**NO VISITORS ARE ALLOWED IN THE SHORT STAY AREA OR RECOVERY ROOM!!** ? ?IF YOU WILL BE ADMITTED INTO THE HOSPITAL YOU ARE ALLOWED ONLY TWO SUPPORT PEOPLE DURING VISITATION HOURS ONLY (7 AM -8PM)   ?The support person(s) must pass our screening, gel in and out, and wear a mask at all times, including in the patient?s room. ?Patients must also wear a mask when staff or their support person are in the room. ?Visitors GUEST BADGE MUST BE WORN VISIBLY  ?One adult visitor may remain with you overnight and MUST be in the room by 8 P.M. ?  ? ? Your procedure is scheduled on: 03/12/22 ? ? Report to [facility] Main Entrance ? ?  Report to admitting at 9:00 AM ? ? Call this number if you have problems the morning of surgery [phone] ? ? Do not eat food :After Midnight. ? ? After Midnight you may have the following liquids until 8:15 AM DAY OF SURGERY ? ?Water ?Black Coffee (sugar ok, NO MILK/CREAM OR CREAMERS)  ?Tea (sugar ok, NO MILK/CREAM OR CREAMERS) regular and decaf                             ?Plain Jell-O (NO RED)                                           ?Fruit ices (not with fruit pulp, NO RED)                                     ?Popsicles (NO RED)                                                                  ?Juice: apple, WHITE grape, WHITE cranberry ?Sports drinks like Gatorade (NO RED) ?Clear broth(vegetable,chicken,beef) ?  ?  ?The day of surgery:  ?Drink ONE (1) Pre-Surgery Clear Ensure at 8:15 AM the morning of surgery. Drink in one sitting. Do not sip.  ?This drink was given to you during your hospital  ?pre-op appointment visit. ?Nothing else to drink after completing the  ?Pre-Surgery Clear Ensure. ?  ?       If you have questions, please contact your surgeon?s office. ? ? ?FOLLOW BOWEL PREP AND ANY ADDITIONAL PRE OP INSTRUCTIONS YOU RECEIVED  FROM YOUR SURGEON'S OFFICE!!! ?  ?  ?Oral Hygiene is also important to reduce your risk of infection.                                    ?Remember - BRUSH YOUR TEETH THE MORNING OF SURGERY WITH YOUR REGULAR TOOTHPASTE ? ? Take these medicines the morning of surgery with A SIP OF WATER: Levothyroxine, Pantoprazole  ?                  ?  You may not have any metal on your body including hair pins, jewelry, and body piercing ? ?           Do not wear make-up, lotions, powders, perfumes, or deodorant ? ?Do not wear nail polish including gel and S&S, artificial/acrylic nails, or any other type of covering on natural nails including finger and toenails. If you have artificial nails, gel coating, etc. that needs to be removed by a nail salon please have this removed prior to surgery or surgery may need to be canceled/ delayed if the surgeon/ anesthesia feels like they are unable to be safely monitored.  ? ?Do not shave  48 hours prior to surgery.  ? ? Do not bring valuables to the hospital. [facility] NOT ?            RESPONSIBLE   FOR VALUABLES. ? ? Bring small overnight bag day of surgery. ?  ? Special Instructions: Bring a copy of your healthcare power of attorney and living will documents         the day of surgery if you haven't scanned them before. ? ?            Please read over the following fact sheets you were given: IF [place] [phone]- Apolonio Schneiders ? ?   [facility] - Preparing for Surgery ?Before surgery, you can play an important role.  Because skin is not sterile, your skin needs to be as free of germs as possible.  You can reduce the number of germs on your skin by washing with CHG (chlorahexidine gluconate) soap before surgery.  CHG is an antiseptic cleaner which kills germs and bonds with the skin to continue killing germs even after washing. ?Please DO NOT use if you have an allergy to CHG or antibacterial soaps.  If your skin becomes  reddened/irritated stop using the CHG and inform your nurse when you arrive at Short Stay. ?Do not shave (including legs and underarms) for at least 48 hours prior to the first CHG shower.  You may shave your face/neck. ? ?Please follow these instructions carefully: ? 1.  Shower with CHG Soap the night before surgery and the  morning of surgery. ? 2.  If you choose to wash your hair, wash your hair first as usual with your normal  shampoo. ? 3.  After you shampoo, rinse your hair and body thoroughly to remove the shampoo.                            ? 4.  Use CHG as you would any other liquid soap.  You can apply chg directly to the skin and wash.  Gently with a scrungie or clean washcloth. ? 5.  Apply the CHG Soap to your body ONLY FROM THE NECK DOWN.   Do   not use on face/ open      ?                     Wound or open sores. Avoid contact with eyes, ears mouth and   genitals (private parts).  ?                     Production manager,  Genitals (private parts) with your normal soap. ?            6.  Wash thoroughly, paying special attention  to the area where your    surgery  will be performed. ? 7.  Thoroughly rinse your body with warm water from the neck down. ? 8.  DO NOT shower/wash with your normal soap after using and rinsing off the CHG Soap. ?               9.  Pat yourself dry with a clean towel. ?           10.  Wear clean pajamas. ?           11.  Place clean sheets on your bed the night of your first shower and do not  sleep with pets. ?Day of Surgery : ?Do not apply any lotions/deodorants the morning of surgery.  Please wear clean clothes to the hospital/surgery center. ? ?FAILURE TO FOLLOW THESE INSTRUCTIONS MAY RESULT IN THE CANCELLATION OF YOUR SURGERY ? ?PATIENT SIGNATURE_________________________________ ? ?NURSE SIGNATURE__________________________________ ? ?________________________________________________________________________  ?

## 2022-02-20 NOTE — Progress Notes (Addendum)
COVID Vaccine Completed: yes x3 ?Date COVID Vaccine completed: 10/28/20 ?Has received booster: ?COVID vaccine manufacturer: Pfizer     ? ?Date of COVID positive in last 90 days: no ? ?PCP - Crist Infante, MD ?Cardiologist - Dorris Carnes, MD ? ?Chest x-ray - CT 11/09/21 Epic ?EKG - 11/29/21 Epic ?Stress Test - 11/04/20 Epic ?ECHO - 08/24/21 Epic ?Cardiac Cath - 12/01/21 Epic ?Pacemaker/ICD device last checked: n/a ?Spinal Cord Stimulator: n/a ? ?Bowel Prep - no ? ?Sleep Study - n/a ?CPAP -  ? ?Fasting Blood Sugar - n/a ?Checks Blood Sugar _____ times a day ? ?Blood Thinner Instructions: n/a ?Aspirin Instructions: ?Last Dose: ? ?Activity level: Can go up a flight of stairs and perform activities of daily living without stopping and without symptoms of chest pain. SOB, has been worked up ? ?Anesthesia review: SOB, pulmonary nodules, CAD, anemia ? ?Patient denies shortness of breath, fever, cough and chest pain at PAT appointment ? ? ?Patient verbalized understanding of instructions that were given to them at the PAT appointment. Patient was also instructed that they will need to review over the PAT instructions again at home before surgery.  ?

## 2022-02-21 ENCOUNTER — Encounter (HOSPITAL_COMMUNITY): Payer: Self-pay

## 2022-02-21 ENCOUNTER — Encounter (HOSPITAL_COMMUNITY)
Admission: RE | Admit: 2022-02-21 | Discharge: 2022-02-21 | Disposition: A | Discharge status: Home / Self Care | Payer: Medicare Other | Source: Ambulatory Visit | Attending: General Surgery | Admitting: General Surgery

## 2022-02-21 VITALS — BP 123/71 | HR 70 | Temp 97.9°F | Resp 16 | Ht 67.0 in | Wt 145.0 lb

## 2022-02-21 DIAGNOSIS — K219 Gastro-esophageal reflux disease without esophagitis: Secondary | ICD-10-CM | POA: Insufficient documentation

## 2022-02-21 DIAGNOSIS — K449 Diaphragmatic hernia without obstruction or gangrene: Secondary | ICD-10-CM | POA: Insufficient documentation

## 2022-02-21 DIAGNOSIS — Z01812 Encounter for preprocedural laboratory examination: Secondary | ICD-10-CM | POA: Insufficient documentation

## 2022-02-21 DIAGNOSIS — I2510 Atherosclerotic heart disease of native coronary artery without angina pectoris: Secondary | ICD-10-CM | POA: Insufficient documentation

## 2022-02-21 HISTORY — DX: Gastro-esophageal reflux disease without esophagitis: K21.9

## 2022-02-21 HISTORY — DX: Unspecified osteoarthritis, unspecified site: M19.90

## 2022-02-21 HISTORY — DX: Dyspnea, unspecified: R06.00

## 2022-02-21 HISTORY — DX: Personal history of other diseases of the digestive system: Z87.19

## 2022-02-21 HISTORY — DX: Anemia, unspecified: D64.9

## 2022-02-21 LAB — CBC
HCT: 44.2 % (ref 36.0–46.0)
Hemoglobin: 14.1 g/dL (ref 12.0–15.0)
MCH: 29.6 pg (ref 26.0–34.0)
MCHC: 31.9 g/dL (ref 30.0–36.0)
MCV: 92.7 fL (ref 80.0–100.0)
Platelets: 314 10*3/uL (ref 150–400)
RBC: 4.77 MIL/uL (ref 3.87–5.11)
RDW: 18.6 % — ABNORMAL HIGH (ref 11.5–15.5)
WBC: 7.1 10*3/uL (ref 4.0–10.5)
nRBC: 0 % (ref 0.0–0.2)

## 2022-02-21 LAB — BASIC METABOLIC PANEL
Anion gap: 7 (ref 5–15)
BUN: 21 mg/dL (ref 8–23)
CO2: 24 mmol/L (ref 22–32)
Calcium: 8.9 mg/dL (ref 8.9–10.3)
Chloride: 108 mmol/L (ref 98–111)
Creatinine, Ser: 0.76 mg/dL (ref 0.44–1.00)
GFR, Estimated: >60 mL/min (ref >60)
Glucose, Bld: 90 mg/dL (ref 70–99)
Potassium: 4.6 mmol/L (ref 3.5–5.1)
Sodium: 139 mmol/L (ref 135–145)

## 2022-02-22 NOTE — Progress Notes (Signed)
Anesthesia Chart Review ? ? Case: [id number] Date/Time: 03/12/22 1100  ? Procedure: LAPAROSCOPIC REPAIR OF HIATAL HERNIA  ? Anesthesia type: General  ? Pre-op diagnosis: HIATAL HERNIA  ? Location: [facility] ROOM 02 / [facility]  ? Surgeons: Kinsinger, Arta Bruce, MD  ? ?  ? ? ?DISCUSSION:81 y.o. never smoker with h/o GERD, dyspnea, hiatal hernia scheduled for above procedure 03/12/2022 with Dr. Gurney Maxin.  ? ?Chronic dyspnea on exertion.   ? ?Cardiac Cath 12/01/2021 with nonobstructive CAD.  ? ?Seen by pulmonology 12/07/2021. Per note anemia may be contributing to shortness of break. No further workup recommended.  Follow up as needed.  ? ?Anticipate pt can proceed with planned procedure barring acute status change.   ?VS: BP 123/71   Pulse 70   Temp 36.6 ?C   Resp 16   Ht '5\' 7"'$  (1.702 m)   Wt 65.8 kg   LMP 01/13/1994   SpO2 100%   BMI 22.71 kg/m?  ? ?PROVIDERS: ?Crist Infante, MD is PCP  ? ?Cardiologist - Dorris Carnes, MD ?LABS: Labs reviewed: Acceptable for surgery. ?(all labs ordered are listed, but only abnormal results are displayed) ? ?Labs Reviewed  ?CBC - Abnormal; Notable for the following components:  ?    Result Value  ? RDW 18.6 (*)   ? All other components within normal limits  ?BASIC METABOLIC PANEL  ? ? ? ?IMAGES: ? ? ?EKG: ?11/29/2021 ?Rate 77 bpm  ?NSR ?LVH ?LAFB ? ? ?CV: ?Cardiac Cath 12/01/2021 ?  Prox LAD lesion is 40% stenosed. ?  ?Nonobstructive CAD with 40% eccentric stenosis of the proximal LAD, non flow-limiting, minimal irregularities of the RCA and LCx ?Normal/low intracardiac filling pressures (wedge pressure 4 mmHg, mean PAP 12) ?  ?Suspect non-cardiac symptoms ? ?Echo 08/24/2021 ? 1. Left ventricular ejection fraction, by estimation, is 60 to 65%. The  ?left ventricle has normal function. The left ventricle has no regional  ?wall motion abnormalities. Left ventricular diastolic parameters are  ?consistent with Grade I diastolic  ?dysfunction (impaired relaxation). The average left ventricular  global  ?longitudinal strain is -17.6 %.  ? 2. Right ventricular systolic function is normal. The right ventricular  ?size is normal. There is normal pulmonary artery systolic pressure.  ? 3. The mitral valve is normal in structure. No evidence of mitral valve  ?regurgitation. No evidence of mitral stenosis.  ? 4. Tricuspid valve regurgitation is moderate.  ? 5. The aortic valve is normal in structure. Aortic valve regurgitation is  ?mild to moderate. No aortic stenosis is present.  ? 6. The inferior vena cava is normal in size with greater than 50%  ?respiratory variability, suggesting right atrial pressure of 3 mmHg. ?Past Medical History:  ?Diagnosis Date  ? Anemia   ? Arthritis   ? Dyspnea   ? GERD (gastroesophageal reflux disease)   ? History of hiatal hernia   ? Hypercholesteremia   ? Hypothyroid 2015  ? ? ?Past Surgical History:  ?Procedure Laterality Date  ? COLONOSCOPY    ? RIGHT HEART CATH AND CORONARY ANGIOGRAPHY N/A 12/01/2021  ? Procedure: RIGHT HEART CATH AND CORONARY ANGIOGRAPHY;  Surgeon: Sherren Mocha, MD;  Location: [place] [facility];  Service: Cardiovascular;  Laterality: N/A;  ? TUBAL LIGATION  11/27/1983  ? UPPER GASTROINTESTINAL ENDOSCOPY    ? ? ?MEDICATIONS: ? albuterol (VENTOLIN HFA) 108 (90 Base) MCG/ACT inhaler  ? Ascorbic Acid (VITAMIN C PO)  ? aspirin 325 MG tablet  ? cholecalciferol (VITAMIN D3) 25 MCG (1000  UNIT) tablet  ? Cyanocobalamin (B-12) 1000 MCG CAPS  ? estropipate (ORTHO-EST 0.625) 0.75 MG tablet  ? ferrous sulfate 325 (65 FE) MG tablet  ? levothyroxine (SYNTHROID, LEVOTHROID) 50 MCG tablet  ? medroxyPROGESTERone (PROVERA) 2.5 MG tablet  ? Multiple Vitamins-Minerals (MULTIVITAMIN PO)  ? Omega-3 Fatty Acids (FISH OIL PO)  ? pantoprazole (PROTONIX) 40 MG tablet  ? zolpidem (AMBIEN) 5 MG tablet  ? ?No current facility-administered medications for this encounter.  ? ? ? ?Konrad Felix Ward, PA-C ?[facility] ?[phone] ? ? ? ? ? ?

## 2022-03-12 ENCOUNTER — Other Ambulatory Visit: Payer: Self-pay

## 2022-03-12 ENCOUNTER — Ambulatory Visit (HOSPITAL_BASED_OUTPATIENT_CLINIC_OR_DEPARTMENT_OTHER): Payer: Medicare Other | Admitting: Anesthesiology

## 2022-03-12 ENCOUNTER — Observation Stay (HOSPITAL_COMMUNITY)
Admission: RE | Admit: 2022-03-12 | Discharge: 2022-03-13 | Disposition: A | Discharge status: Home / Self Care | Payer: Medicare Other | Source: Ambulatory Visit | Attending: General Surgery | Admitting: General Surgery

## 2022-03-12 ENCOUNTER — Ambulatory Visit (HOSPITAL_COMMUNITY): Payer: Medicare Other | Admitting: Physician Assistant

## 2022-03-12 ENCOUNTER — Encounter (HOSPITAL_COMMUNITY)
Admission: RE | Disposition: A | Discharge status: Home / Self Care | Payer: Self-pay | Source: Ambulatory Visit | Attending: General Surgery

## 2022-03-12 ENCOUNTER — Encounter (HOSPITAL_COMMUNITY): Payer: Self-pay | Admitting: General Surgery

## 2022-03-12 DIAGNOSIS — K449 Diaphragmatic hernia without obstruction or gangrene: Secondary | ICD-10-CM

## 2022-03-12 DIAGNOSIS — D500 Iron deficiency anemia secondary to blood loss (chronic): Secondary | ICD-10-CM | POA: Diagnosis not present

## 2022-03-12 DIAGNOSIS — Z79899 Other long term (current) drug therapy: Secondary | ICD-10-CM | POA: Insufficient documentation

## 2022-03-12 DIAGNOSIS — K259 Gastric ulcer, unspecified as acute or chronic, without hemorrhage or perforation: Secondary | ICD-10-CM | POA: Insufficient documentation

## 2022-03-12 HISTORY — PX: HIATAL HERNIA REPAIR: SHX195

## 2022-03-12 LAB — CBC
HCT: 33.9 % — ABNORMAL LOW (ref 36.0–46.0)
Hemoglobin: 11.3 g/dL — ABNORMAL LOW (ref 12.0–15.0)
MCH: 31.1 pg (ref 26.0–34.0)
MCHC: 33.3 g/dL (ref 30.0–36.0)
MCV: 93.4 fL (ref 80.0–100.0)
Platelets: 205 10*3/uL (ref 150–400)
RBC: 3.63 MIL/uL — ABNORMAL LOW (ref 3.87–5.11)
RDW: 15.7 % — ABNORMAL HIGH (ref 11.5–15.5)
WBC: 6.4 10*3/uL (ref 4.0–10.5)
nRBC: 0 % (ref 0.0–0.2)

## 2022-03-12 LAB — CREATININE, SERUM
Creatinine, Ser: 0.51 mg/dL (ref 0.44–1.00)
GFR, Estimated: >60 mL/min (ref >60)

## 2022-03-12 SURGERY — REPAIR, HERNIA, HIATAL, LAPAROSCOPIC
Anesthesia: General

## 2022-03-12 MED ORDER — ONDANSETRON 4 MG PO TBDP: 4.0000 mg | ORAL_TABLET | Freq: Four times a day (QID) | ORAL | Status: DC | PRN

## 2022-03-12 MED ORDER — DEXTROSE-NACL 5-0.45 % IV SOLN: INTRAVENOUS | Status: DC

## 2022-03-12 MED ORDER — MORPHINE SULFATE (PF) 2 MG/ML IV SOLN: 2.0000 mg | INTRAVENOUS | Status: DC | PRN

## 2022-03-12 MED ORDER — CEFAZOLIN SODIUM-DEXTROSE 2-4 GM/100ML-% IV SOLN
2.0000 g | INTRAVENOUS | Status: AC
  Administered 2022-03-12: 2 g via INTRAVENOUS
  Filled 2022-03-12: qty 100

## 2022-03-12 MED ORDER — LIDOCAINE HCL (CARDIAC) PF 100 MG/5ML IV SOSY
PREFILLED_SYRINGE | INTRAVENOUS | Status: DC | PRN
  Administered 2022-03-12: 60 mg via INTRAVENOUS

## 2022-03-12 MED ORDER — METOPROLOL TARTRATE 5 MG/5ML IV SOLN: 5.0000 mg | Freq: Four times a day (QID) | INTRAVENOUS | Status: DC | PRN

## 2022-03-12 MED ORDER — CHLORHEXIDINE GLUCONATE CLOTH 2 % EX PADS: 6.0000 | MEDICATED_PAD | Freq: Once | CUTANEOUS | Status: DC

## 2022-03-12 MED ORDER — ONDANSETRON HCL 4 MG/2ML IJ SOLN
INTRAMUSCULAR | Status: DC | PRN
  Administered 2022-03-12: 4 mg via INTRAVENOUS

## 2022-03-12 MED ORDER — PHENYLEPHRINE 40 MCG/ML (10ML) SYRINGE FOR IV PUSH (FOR BLOOD PRESSURE SUPPORT)
PREFILLED_SYRINGE | INTRAVENOUS | Status: AC
  Filled 2022-03-12: qty 10

## 2022-03-12 MED ORDER — OXYCODONE HCL 5 MG/5ML PO SOLN: 5.0000 mg | Freq: Once | ORAL | Status: DC | PRN

## 2022-03-12 MED ORDER — ALBUTEROL SULFATE (2.5 MG/3ML) 0.083% IN NEBU: 2.5000 mg | INHALATION_SOLUTION | Freq: Four times a day (QID) | RESPIRATORY_TRACT | Status: DC | PRN

## 2022-03-12 MED ORDER — ONDANSETRON HCL 4 MG/2ML IJ SOLN
4.0000 mg | Freq: Once | INTRAMUSCULAR | Status: AC | PRN
  Administered 2022-03-12: 4 mg via INTRAVENOUS

## 2022-03-12 MED ORDER — LACTATED RINGERS IR SOLN
Status: DC | PRN
  Administered 2022-03-12: 1000 mL

## 2022-03-12 MED ORDER — ORAL CARE MOUTH RINSE: 15.0000 mL | Freq: Once | OROMUCOSAL | Status: AC

## 2022-03-12 MED ORDER — EPHEDRINE 5 MG/ML INJ
INTRAVENOUS | Status: AC
  Filled 2022-03-12: qty 5

## 2022-03-12 MED ORDER — ACETAMINOPHEN 500 MG PO TABS
1000.0000 mg | ORAL_TABLET | Freq: Four times a day (QID) | ORAL | Status: DC
  Administered 2022-03-12 – 2022-03-13 (×3): 1000 mg via ORAL
  Filled 2022-03-12 (×3): qty 2

## 2022-03-12 MED ORDER — LIDOCAINE HCL (PF) 2 % IJ SOLN
INTRAMUSCULAR | Status: AC
  Filled 2022-03-12: qty 5

## 2022-03-12 MED ORDER — ENOXAPARIN SODIUM 40 MG/0.4ML IJ SOSY
40.0000 mg | PREFILLED_SYRINGE | INTRAMUSCULAR | Status: DC
  Administered 2022-03-13: 40 mg via SUBCUTANEOUS
  Filled 2022-03-12: qty 0.4

## 2022-03-12 MED ORDER — BUPIVACAINE LIPOSOME 1.3 % IJ SUSP
20.0000 mL | Freq: Once | INTRAMUSCULAR | Status: DC
Start: 2022-03-12 — End: 2022-03-12

## 2022-03-12 MED ORDER — PROPOFOL 10 MG/ML IV BOLUS
INTRAVENOUS | Status: DC | PRN
  Administered 2022-03-12: 110 mg via INTRAVENOUS

## 2022-03-12 MED ORDER — AMISULPRIDE (ANTIEMETIC) 5 MG/2ML IV SOLN: 10.0000 mg | Freq: Once | INTRAVENOUS | Status: DC | PRN

## 2022-03-12 MED ORDER — DEXAMETHASONE SODIUM PHOSPHATE 10 MG/ML IJ SOLN
INTRAMUSCULAR | Status: AC
  Filled 2022-03-12: qty 1

## 2022-03-12 MED ORDER — PROPOFOL 10 MG/ML IV BOLUS
INTRAVENOUS | Status: AC
  Filled 2022-03-12: qty 20

## 2022-03-12 MED ORDER — ARTIFICIAL TEARS OPHTHALMIC OINT
TOPICAL_OINTMENT | OPHTHALMIC | Status: AC
  Filled 2022-03-12: qty 3.5

## 2022-03-12 MED ORDER — DEXAMETHASONE SODIUM PHOSPHATE 10 MG/ML IJ SOLN
INTRAMUSCULAR | Status: DC | PRN
  Administered 2022-03-12: 4 mg via INTRAVENOUS

## 2022-03-12 MED ORDER — ONDANSETRON HCL 4 MG/2ML IJ SOLN: 4.0000 mg | Freq: Four times a day (QID) | INTRAMUSCULAR | Status: DC | PRN

## 2022-03-12 MED ORDER — GLYCOPYRROLATE 0.2 MG/ML IJ SOLN
INTRAMUSCULAR | Status: AC
  Filled 2022-03-12: qty 1

## 2022-03-12 MED ORDER — FENTANYL CITRATE (PF) 100 MCG/2ML IJ SOLN
INTRAMUSCULAR | Status: AC
Start: 2022-03-12 — End: ?
  Filled 2022-03-12: qty 2

## 2022-03-12 MED ORDER — ENSURE PRE-SURGERY PO LIQD
296.0000 mL | Freq: Once | ORAL | Status: DC
  Filled 2022-03-12: qty 296

## 2022-03-12 MED ORDER — EPHEDRINE SULFATE-NACL 50-0.9 MG/10ML-% IV SOSY
PREFILLED_SYRINGE | INTRAVENOUS | Status: DC | PRN
Start: 2022-03-12 — End: 2022-03-12
  Administered 2022-03-12: 10 mg via INTRAVENOUS
  Administered 2022-03-12: 5 mg via INTRAVENOUS

## 2022-03-12 MED ORDER — ROCURONIUM BROMIDE 10 MG/ML (PF) SYRINGE
PREFILLED_SYRINGE | INTRAVENOUS | Status: AC
  Filled 2022-03-12: qty 10

## 2022-03-12 MED ORDER — PANTOPRAZOLE SODIUM 40 MG IV SOLR
40.0000 mg | Freq: Every day | INTRAVENOUS | Status: DC
  Administered 2022-03-12: 40 mg via INTRAVENOUS
  Filled 2022-03-12: qty 10

## 2022-03-12 MED ORDER — CHLORHEXIDINE GLUCONATE 0.12 % MT SOLN
15.0000 mL | Freq: Once | OROMUCOSAL | Status: AC
  Administered 2022-03-12: 15 mL via OROMUCOSAL

## 2022-03-12 MED ORDER — DIPHENHYDRAMINE HCL 50 MG/ML IJ SOLN: 12.5000 mg | Freq: Four times a day (QID) | INTRAMUSCULAR | Status: DC | PRN

## 2022-03-12 MED ORDER — LEVOTHYROXINE SODIUM 50 MCG PO TABS
50.0000 ug | ORAL_TABLET | Freq: Every day | ORAL | Status: DC
  Administered 2022-03-13: 50 ug via ORAL
  Filled 2022-03-12: qty 1

## 2022-03-12 MED ORDER — FENTANYL CITRATE PF 50 MCG/ML IJ SOSY
25.0000 ug | PREFILLED_SYRINGE | INTRAMUSCULAR | Status: DC | PRN
  Administered 2022-03-12 (×3): 50 ug via INTRAVENOUS

## 2022-03-12 MED ORDER — 0.9 % SODIUM CHLORIDE (POUR BTL) OPTIME
TOPICAL | Status: DC | PRN
Start: 2022-03-12 — End: 2022-03-12
  Administered 2022-03-12: 1000 mL

## 2022-03-12 MED ORDER — BUPIVACAINE-EPINEPHRINE 0.25% -1:200000 IJ SOLN
INTRAMUSCULAR | Status: DC | PRN
Start: 2022-03-12 — End: 2022-03-12
  Administered 2022-03-12: 30 mL

## 2022-03-12 MED ORDER — ACETAMINOPHEN 500 MG PO TABS: 1000.0000 mg | ORAL_TABLET | Freq: Once | ORAL | Status: DC

## 2022-03-12 MED ORDER — SUGAMMADEX SODIUM 200 MG/2ML IV SOLN
INTRAVENOUS | Status: DC | PRN
  Administered 2022-03-12: 200 mg via INTRAVENOUS

## 2022-03-12 MED ORDER — ONDANSETRON HCL 4 MG/2ML IJ SOLN
INTRAMUSCULAR | Status: AC
  Filled 2022-03-12: qty 2

## 2022-03-12 MED ORDER — ZOLPIDEM TARTRATE 5 MG PO TABS: 5.0000 mg | ORAL_TABLET | Freq: Every evening | ORAL | Status: DC | PRN

## 2022-03-12 MED ORDER — ENSURE SURGERY PO LIQD
237.0000 mL | Freq: Two times a day (BID) | ORAL | Status: DC
  Administered 2022-03-13: 237 mL via ORAL

## 2022-03-12 MED ORDER — ACETAMINOPHEN 500 MG PO TABS
1000.0000 mg | ORAL_TABLET | ORAL | Status: AC
  Administered 2022-03-12: 1000 mg via ORAL
  Filled 2022-03-12: qty 2

## 2022-03-12 MED ORDER — FENTANYL CITRATE PF 50 MCG/ML IJ SOSY
PREFILLED_SYRINGE | INTRAMUSCULAR | Status: AC
  Filled 2022-03-12: qty 1

## 2022-03-12 MED ORDER — DIPHENHYDRAMINE HCL 12.5 MG/5ML PO ELIX
12.5000 mg | ORAL_SOLUTION | Freq: Four times a day (QID) | ORAL | Status: DC | PRN
  Administered 2022-03-12: 12.5 mg via ORAL
  Filled 2022-03-12: qty 5

## 2022-03-12 MED ORDER — BUPIVACAINE LIPOSOME 1.3 % IJ SUSP
INTRAMUSCULAR | Status: AC
  Filled 2022-03-12: qty 20

## 2022-03-12 MED ORDER — FENTANYL CITRATE PF 50 MCG/ML IJ SOSY
PREFILLED_SYRINGE | INTRAMUSCULAR | Status: AC
  Filled 2022-03-12: qty 2

## 2022-03-12 MED ORDER — BUPIVACAINE LIPOSOME 1.3 % IJ SUSP
INTRAMUSCULAR | Status: DC | PRN
  Administered 2022-03-12: 20 mL

## 2022-03-12 MED ORDER — OXYCODONE HCL 5 MG PO TABS: 5.0000 mg | ORAL_TABLET | Freq: Once | ORAL | Status: DC | PRN

## 2022-03-12 MED ORDER — LACTATED RINGERS IV SOLN: INTRAVENOUS | Status: DC

## 2022-03-12 MED ORDER — PHENYLEPHRINE 40 MCG/ML (10ML) SYRINGE FOR IV PUSH (FOR BLOOD PRESSURE SUPPORT)
PREFILLED_SYRINGE | INTRAVENOUS | Status: DC | PRN
  Administered 2022-03-12 (×2): 80 ug via INTRAVENOUS
  Administered 2022-03-12: 120 ug via INTRAVENOUS
  Administered 2022-03-12: 80 ug via INTRAVENOUS

## 2022-03-12 MED ORDER — FENTANYL CITRATE (PF) 100 MCG/2ML IJ SOLN
INTRAMUSCULAR | Status: DC | PRN
  Administered 2022-03-12 (×2): 50 ug via INTRAVENOUS

## 2022-03-12 MED ORDER — OXYCODONE HCL 5 MG PO TABS
5.0000 mg | ORAL_TABLET | ORAL | Status: DC | PRN
  Administered 2022-03-12 – 2022-03-13 (×3): 5 mg via ORAL
  Filled 2022-03-12 (×3): qty 1

## 2022-03-12 MED ORDER — ROCURONIUM BROMIDE 100 MG/10ML IV SOLN
INTRAVENOUS | Status: DC | PRN
  Administered 2022-03-12: 10 mg via INTRAVENOUS
  Administered 2022-03-12: 70 mg via INTRAVENOUS

## 2022-03-12 MED ORDER — BUPIVACAINE-EPINEPHRINE (PF) 0.25% -1:200000 IJ SOLN
INTRAMUSCULAR | Status: AC
  Filled 2022-03-12: qty 30

## 2022-03-12 SURGICAL SUPPLY — 50 items
APL PRP STRL LF DISP 70% ISPRP (MISCELLANEOUS) ×1
APL SKNCLS STERI-STRIP NONHPOA (GAUZE/BANDAGES/DRESSINGS) ×1
APPLIER CLIP 5 13 M/L LIGAMAX5 (MISCELLANEOUS)
APR CLP MED LRG 5 ANG JAW (MISCELLANEOUS)
BAG COUNTER SPONGE SURGICOUNT (BAG) IMPLANT
BAG SPNG CNTER NS LX DISP (BAG)
BENZOIN TINCTURE PRP APPL 2/3 (GAUZE/BANDAGES/DRESSINGS) ×1 IMPLANT
BNDG ELASTIC 2X5.8 VLCR STR LF (GAUZE/BANDAGES/DRESSINGS) ×1 IMPLANT
CHLORAPREP W/TINT 26 (MISCELLANEOUS) ×3 IMPLANT
CLIP APPLIE 5 13 M/L LIGAMAX5 (MISCELLANEOUS) IMPLANT
CLSR STERI-STRIP ANTIMIC 1/2X4 (GAUZE/BANDAGES/DRESSINGS) ×1 IMPLANT
COVER SURGICAL LIGHT HANDLE (MISCELLANEOUS) ×3 IMPLANT
DRAIN PENROSE 0.5X18 (DRAIN) ×3 IMPLANT
ELECT L-HOOK LAP 45CM DISP (ELECTROSURGICAL) ×2
ELECT REM PT RETURN 15FT ADLT (MISCELLANEOUS) ×3 IMPLANT
ELECTRODE L-HOOK LAP 45CM DISP (ELECTROSURGICAL) ×1 IMPLANT
GLOVE BIOGEL PI IND STRL 7.0 (GLOVE) ×2 IMPLANT
GLOVE BIOGEL PI INDICATOR 7.0 (GLOVE) ×1
GLOVE SURG POLYISO LF SZ7 (GLOVE) ×3 IMPLANT
GOWN STRL REUS W/ TWL XL LVL3 (GOWN DISPOSABLE) IMPLANT
GOWN STRL REUS W/TWL XL LVL3 (GOWN DISPOSABLE)
GRASPER SUT TROCAR 14GX15 (MISCELLANEOUS) ×1 IMPLANT
IRRIG SUCT STRYKERFLOW 2 WTIP (MISCELLANEOUS) ×2
IRRIGATION SUCT STRKRFLW 2 WTP (MISCELLANEOUS) ×2 IMPLANT
KIT BASIN OR (CUSTOM PROCEDURE TRAY) ×3 IMPLANT
KIT TURNOVER KIT A (KITS) IMPLANT
MARKER SKIN DUAL TIP RULER LAB (MISCELLANEOUS) ×3 IMPLANT
NS IRRIG 1000ML POUR BTL (IV SOLUTION) ×2 IMPLANT
PACK CARDIOVASCULAR III (CUSTOM PROCEDURE TRAY) ×2 IMPLANT
PAD POSITIONING PINK XL (MISCELLANEOUS) ×2 IMPLANT
SCISSORS LAP 5X45 EPIX DISP (ENDOMECHANICALS) ×3 IMPLANT
SET TUBE SMOKE EVAC HIGH FLOW (TUBING) ×3 IMPLANT
SHEARS HARMONIC ACE PLUS 45CM (MISCELLANEOUS) ×3 IMPLANT
SLEEVE ENDOPATH XCEL 5M (ENDOMECHANICALS) ×4 IMPLANT
SOL ANTI FOG 6CC (MISCELLANEOUS) ×2 IMPLANT
SOLUTION ANTI FOG 6CC (MISCELLANEOUS) ×1
SPIKE FLUID TRANSFER (MISCELLANEOUS) ×3 IMPLANT
SUT ETHIBOND 0 36 GRN (SUTURE) ×7 IMPLANT
SUT MNCRL AB 4-0 PS2 18 (SUTURE) ×3 IMPLANT
SUT SILK 2 0 SH (SUTURE) ×7 IMPLANT
SUT VICRYL 0 TIES 12 18 (SUTURE) ×1 IMPLANT
SUT VICRYL 0 UR6 27IN ABS (SUTURE) ×1 IMPLANT
TAPE CLOTH 4X10 WHT NS (GAUZE/BANDAGES/DRESSINGS) ×3 IMPLANT
TIP INNERVISION DETACH 50FR (MISCELLANEOUS) IMPLANT
TIP INNERVISION DETACH 56FR (MISCELLANEOUS) IMPLANT
TOWEL OR 17X26 10 PK STRL BLUE (TOWEL DISPOSABLE) ×6 IMPLANT
TOWEL OR NON WOVEN STRL DISP B (DISPOSABLE) ×3 IMPLANT
TRAY FOLEY MTR SLVR 16FR STAT (SET/KITS/TRAYS/PACK) ×2 IMPLANT
TROCAR XCEL 12X100 BLDLESS (ENDOMECHANICALS) IMPLANT
TROCAR XCEL NON-BLD 5MMX100MML (ENDOMECHANICALS) ×3 IMPLANT

## 2022-03-12 NOTE — Transfer of Care (Signed)
Immediate Anesthesia Transfer of Care Note ? ?Patient: Monica White ? ?Procedure(s) Performed: LAPAROSCOPIC REPAIR OF HIATAL HERNIA ? ?Patient Location: PACU ? ?Anesthesia Type:General ? ?Level of Consciousness: awake, alert  and oriented ? ?Airway & Oxygen Therapy: Patient Spontanous Breathing and Patient connected to face mask oxygen ? ?Post-op Assessment: Report given to RN and Post -op Vital signs reviewed and stable ? ?Post vital signs: Reviewed and stable ? ?Last Vitals:  ?Vitals Value Taken Time  ?BP 140/77 03/12/22 1345  ?Temp    ?Pulse 67 03/12/22 1347  ?Resp 15 03/12/22 1347  ?SpO2 100 % 03/12/22 1347  ?Vitals shown include unvalidated device data. ? ?Last Pain:  ?Vitals:  ? 03/12/22 0930  ?TempSrc:   ?PainSc: 0-No pain  ?   ? ?  ? ?Complications: No notable events documented. ?

## 2022-03-12 NOTE — Anesthesia Procedure Notes (Signed)
Procedure Name: Intubation ?Date/Time: 03/12/2022 11:35 AM ?Performed by: [place] Zamariya Neal C, CRNA ?Pre-anesthesia Checklist: Patient identified, Emergency Drugs available, Suction available and Patient being monitored ?Patient Re-evaluated:Patient Re-evaluated prior to induction ?Oxygen Delivery Method: Circle system utilized ?Preoxygenation: Pre-oxygenation with 100% oxygen ?Induction Type: IV induction ?Ventilation: Mask ventilation without difficulty ?Laryngoscope Size: Mac and 3 ?Grade View: Grade I ?Tube type: Oral ?Tube size: 7.0 mm ?Number of attempts: 1 ?Airway Equipment and Method: Stylet and Oral airway ?Placement Confirmation: ETT inserted through vocal cords under direct vision, positive ETCO2 and breath sounds checked- equal and bilateral ?Secured at: 21 cm ?Tube secured with: Tape ?Dental Injury: Teeth and Oropharynx as per pre-operative assessment  ?Comments: EZ mask by paramedic student. DL x1; MAC 3 by paramedic student. Grade 1 view. AOI.  ? ? ? ? ?

## 2022-03-12 NOTE — Op Note (Signed)
Preoperative diagnosis: type III paraesophageal hernia ? ?Postoperative diagnosis: same  ? ?Procedure: laparoscopic paraesophageal hernia repair, laparoscopic Toupet fundoplication ? ?Surgeon: Gurney Maxin, M.D. ? ?Asst: Romana Juniper, M.D. ? ?Anesthesia: general ? ?Indications for procedure: Monica White is a 81 y.o. year old female with symptoms of anemia and swallowing issues. ? ?Description of procedure: The patient was brought into the operative suite. Anesthesia was administered with General endotracheal anesthesia. WHO checklist was applied. The patient was then placed in supine position. The area was prepped and draped in the usual sterile fashion. ? ?Next, a left subcostal incision was made. A 79m trocar was used to gain access to the peritoneal cavity by optical entry technique. Pneumoperitoneum was applied with a high flow and low pressure. The laparoscope was reinserted to confirm position. A 5 mm trocar was placed in the left periumbilical space. Bilateral TAP blocks were placed with Marcaine/Exparel mix. 1 5 mm trocar was placed in the right subcostal area. 1 12 mm trocar was placed in the right mid abdominal space. 1 5 mm trocar was placed in the left lateral space. A Nathanson retractor was placed in the subxiphoid space and used to retract the left lobe of the liver. ? ?The hiatal hernia appeared moderate in size and contained the upper portion of the stomach. The pars flaccida was divided with harmonic scalpel The peritoneum of the right crus was divided and the sac separated from the chest contents. This plane was continued anteriorly and to the left crus. Next, the posterior area was dissected free. Additional care was used to dissect attachments to the chest to the sac and esophagus to improve mobility. The esophagus was completely freed from surrounding attachments. Care was taken to avoid injury to the vagus nerves. The sac was divided from the GE junction and removed. ? ?The crus was  repaired with 4 interrupted 2-0 ethibond sutures showing appropriate sizing of the crus around the bougie. A toupet was created by bringing the fundus posterior to the esophagus and 3 2-0 silks were used to suture the esophagus to the fundus on the right side. A portion of anterior fundus was then sutured to the esophagus on the left side with 3 2-0 silks.  ? ?Hemostasis was inspected and intact. Pneumoperitoneum was removed. All trocars were removed. All incisions were closed with 4-0 monocryl subcuticular suture. Dermabond was placed for dressing. The patient awoke from anesthesia and was brought to pacu in stable condition. ? ? ?Findings: moderate hiatal hernia ? ?Specimen: none ? ?Implant: none  ? ?Blood loss: 10 ml ? ?Local anesthesia: 50 ml Exparel:Marcaine Mix ? ?Complications: none ? ?LGurney Maxin M.D. ?General, Bariatric, & Minimally Invasive Surgery ?[facility] [place]? ?

## 2022-03-12 NOTE — H&P (Signed)
Chief Complaint: New Patient (New patient hiatal hernia ) ? ? ?History of Present Illness: ?Monica White is a 81 y.o. female who is seen today as an office consultation at the request of Dr. Therisa Doyne for evaluation of New Patient (New patient hiatal hernia ) ?.  ? ?She has had worsening shortness of breath for the last 6 months. She had a heart work up with cath that was negative and a pulmonary work up with imaging and PFTs that was negative. She was found to be anemic and recently underwent endoscopy showing large hiatal hernia with multiple erosions throughout the stomach. She was put on acid suppression and iron supplement and has seen some hemoglobin rise from 11-12. ? ?She denies heartburn, dysphagia, early satiety, or regurgitation. ? ?She is able to walk up 2 flights of stairs or walk on the treadmill for 15 minutes at a time. ? ?Review of Systems: ?A complete review of systems was obtained from the patient. I have reviewed this information and discussed as appropriate with the patient. See HPI as well for other ROS. ? ?Review of Systems  ?Constitutional: Negative.  ?HENT: Negative.  ?Eyes: Negative.  ?Respiratory: Negative.  ?Cardiovascular: Negative.  ?Gastrointestinal: Negative.  ?Genitourinary: Negative.  ?Musculoskeletal: Negative.  ?Skin: Negative.  ?Neurological: Negative.  ?Endo/Heme/Allergies: Negative.  ?Psychiatric/Behavioral: Negative.  ? ? ?Medical History: ?Past Medical History:  ?Diagnosis Date  ? Anemia  ? ?There is no problem list on file for this patient. ? ?History reviewed. No pertinent surgical history.  ? ?No Known Allergies ? ?Current Outpatient Medications on File Prior to Visit  ?Medication Sig Dispense Refill  ? levothyroxine (SYNTHROID) 50 MCG tablet 1 tablet in the morning on an empty stomach  ? medroxyPROGESTERone (PROVERA) 2.5 MG tablet 2 tablets with food  ? simvastatin (ZOCOR) 20 MG tablet 1 tablet in the evening  ? ?No current facility-administered medications on file prior to  visit.  ? ?History reviewed. No pertinent family history.  ? ?Social History  ? ?Tobacco Use  ?Smoking Status Never  ?Smokeless Tobacco Never  ? ? ?Social History  ? ?Socioeconomic History  ? Marital status: Widowed  ?Tobacco Use  ? Smoking status: Never  ? Smokeless tobacco: Never  ?Substance and Sexual Activity  ? Alcohol use: Yes  ?Comment: once a week  ? Drug use: Never  ? ?Objective:  ? ?Vitals:  ?01/18/22 1554  ?BP: 114/76  ?Pulse: 74  ?Temp: 36.8 ?C (98.2 ?F)  ?SpO2: 96%  ?Weight: 67.9 kg (149 lb 9.6 oz)  ?Height: 170.2 cm ('5\' 7"'$ )  ? ?Body mass index is 23.43 kg/m?. ? ?Physical Exam ?Constitutional:  ?Appearance: Normal appearance.  ?HENT:  ?Head: Normocephalic and atraumatic.  ?Pulmonary:  ?Effort: Pulmonary effort is normal.  ?Musculoskeletal:  ?General: Normal range of motion.  ?Cervical back: Normal range of motion.  ?Neurological:  ?General: No focal deficit present.  ?Mental Status: She is alert and oriented to person, place, and time. Mental status is at baseline.  ?Psychiatric:  ?Mood and Affect: Mood normal.  ?Behavior: Behavior normal.  ?Thought Content: Thought content normal.  ? ? ? ?Labs, Imaging and Diagnostic Testing: ?I reviewed Ct images and Dr. Therisa Doyne and Dr. Valeta Harms, and Dr. Alan Ripper notes. I reviewed endoscopy results from Dr. Therisa Doyne ? ?Assessment and Plan:  ? ?Diagnoses and all orders for this visit: ? ?Iron deficiency anemia due to chronic blood loss ? ?Acute gastric erosion ? ?Hiatal hernia ? ?Shortness of breath ? ?Her main complaint is respiratory in nature. We discussed  likelihood of this improving with hiatal hernia repair and thoracic changes vs improvement in anemia prevention.  ? ?We discussed the etiology and symptoms of hiatal hernias. We discussed possible future issues of worsened reflux, early satiety, Cameron's ulcer, and volvulus. We went over surgical options of laparoscopic reduction of the hiatal hernia with identification of the esophagus and stomach, movable of the sac,  ensuring appropriate esophageal length into the abdomen, repairing the diaphragm crura, possible placement of mesh, partial fundoplication and other fundoplication options, and possible need for gastrostomy tube. We discussed risks of bleeding, infection, pneumonia, injury to esophagus, injury to stomach, injury to other abdominal or thoracic organs, trouble swallowing, gas bloat, and need for additional surgery. All questions were answered. Patient decided proceed with lap hiatal hernia repair with partial fundoplication with observation stay.  ?

## 2022-03-12 NOTE — Anesthesia Preprocedure Evaluation (Addendum)
Anesthesia Evaluation  ?Patient identified by MRN, date of birth, ID band ?Patient awake ? ? ? ?Reviewed: ?Allergy & Precautions, NPO status , Patient's Chart, lab work & pertinent test results ? ?History of Anesthesia Complications ?Negative for: history of anesthetic complications ? ?Airway ?Mallampati: III ? ?TM Distance: >3 FB ?Neck ROM: Full ? ? ? Dental ? ?(+) Dental Advisory Given, Teeth Intact ?  ?Pulmonary ?shortness of breath,  ?  ?Pulmonary exam normal ? ? ? ? ? ? ? Cardiovascular ?Normal cardiovascular exam ? ?HLD ? ?Echo 08/24/21: EF 60-65%, no RWMA, g1dd, normal RVSF, mod TR, mild-mod AR ? ?Nonobstructive CAD with 40% eccentric stenosis of the proximal LAD, non flow-limiting, minimal irregularities of the RCA and LCx ?  ?Neuro/Psych ?negative neurological ROS ?   ? GI/Hepatic ?Neg liver ROS, hiatal hernia, GERD  ,  ?Endo/Other  ?Hypothyroidism  ? Renal/GU ?negative Renal ROS  ?negative genitourinary ?  ?Musculoskeletal ? ?(+) Arthritis ,  ? Abdominal ?  ?Peds ? Hematology ?negative hematology ROS ?(+)   ?Anesthesia Other Findings ? ? Reproductive/Obstetrics ? ?  ? ? ? ? ? ? ? ? ? ? ? ? ? ?  ?  ? ? ? ? ? ? ?Anesthesia Physical ?Anesthesia Plan ? ?ASA: 2 ? ?Anesthesia Plan: General  ? ?Post-op Pain Management: Tylenol PO (pre-op)* and Toradol IV (intra-op)*  ? ?Induction: Intravenous and Rapid sequence ? ?PONV Risk Score and Plan: 4 or greater and Ondansetron, Dexamethasone and Treatment may vary due to age or medical condition ? ?Airway Management Planned: Oral ETT ? ?Additional Equipment: None ? ?Intra-op Plan:  ? ?Post-operative Plan: Extubation in OR ? ?Informed Consent: I have reviewed the patients History and Physical, chart, labs and discussed the procedure including the risks, benefits and alternatives for the proposed anesthesia with the patient or authorized representative who has indicated his/her understanding and acceptance.  ? ? ? ?Dental advisory  given ? ?Plan Discussed with:  ? ?Anesthesia Plan Comments:   ? ? ? ? ? ? ?Anesthesia Quick Evaluation ? ?

## 2022-03-12 NOTE — Progress Notes (Signed)
?  Transition of Care (TOC) Screening Note ? ? ?Patient Details  ?Name: Monica White ?Date of Birth: 06/15/1941 ? ? ?Transition of Care (TOC) CM/SW Contact:    ?Yiselle Babich, LCSW ?Phone Number: ?03/12/2022, 3:48 PM ? ? ? ?Transition of Care Department [facility]) has reviewed patient and no TOC needs have been identified at this time. We will continue to monitor patient advancement through interdisciplinary progression rounds. If new patient transition needs arise, please place a TOC consult. ? ? ?

## 2022-03-13 ENCOUNTER — Encounter (HOSPITAL_COMMUNITY): Payer: Self-pay | Admitting: General Surgery

## 2022-03-13 DIAGNOSIS — D500 Iron deficiency anemia secondary to blood loss (chronic): Secondary | ICD-10-CM | POA: Diagnosis not present

## 2022-03-13 DIAGNOSIS — K259 Gastric ulcer, unspecified as acute or chronic, without hemorrhage or perforation: Secondary | ICD-10-CM | POA: Diagnosis not present

## 2022-03-13 DIAGNOSIS — Z79899 Other long term (current) drug therapy: Secondary | ICD-10-CM | POA: Diagnosis not present

## 2022-03-13 DIAGNOSIS — K449 Diaphragmatic hernia without obstruction or gangrene: Secondary | ICD-10-CM | POA: Diagnosis not present

## 2022-03-13 LAB — CBC
HCT: 35.9 % — ABNORMAL LOW (ref 36.0–46.0)
Hemoglobin: 12 g/dL (ref 12.0–15.0)
MCH: 31.3 pg (ref 26.0–34.0)
MCHC: 33.4 g/dL (ref 30.0–36.0)
MCV: 93.5 fL (ref 80.0–100.0)
Platelets: 232 10*3/uL (ref 150–400)
RBC: 3.84 MIL/uL — ABNORMAL LOW (ref 3.87–5.11)
RDW: 15.2 % (ref 11.5–15.5)
WBC: 8.5 10*3/uL (ref 4.0–10.5)
nRBC: 0 % (ref 0.0–0.2)

## 2022-03-13 LAB — BASIC METABOLIC PANEL
Anion gap: 5 (ref 5–15)
BUN: 11 mg/dL (ref 8–23)
CO2: 25 mmol/L (ref 22–32)
Calcium: 8.5 mg/dL — ABNORMAL LOW (ref 8.9–10.3)
Chloride: 105 mmol/L (ref 98–111)
Creatinine, Ser: 0.57 mg/dL (ref 0.44–1.00)
GFR, Estimated: >60 mL/min (ref >60)
Glucose, Bld: 165 mg/dL — ABNORMAL HIGH (ref 70–99)
Potassium: 4.5 mmol/L (ref 3.5–5.1)
Sodium: 135 mmol/L (ref 135–145)

## 2022-03-13 MED ORDER — ACETAMINOPHEN 500 MG PO TABS: 1000.0000 mg | ORAL_TABLET | Freq: Four times a day (QID) | ORAL | 0 refills | Status: AC

## 2022-03-13 MED ORDER — OXYCODONE HCL 5 MG PO TABS
5.0000 mg | ORAL_TABLET | Freq: Three times a day (TID) | ORAL | 0 refills | Status: DC | PRN
Start: 2022-03-13 — End: 2022-11-01

## 2022-03-13 MED ORDER — DOCUSATE SODIUM 100 MG PO CAPS: 100.0000 mg | ORAL_CAPSULE | Freq: Two times a day (BID) | ORAL | 0 refills | Status: AC

## 2022-03-13 MED ORDER — ONDANSETRON HCL 4 MG PO TABS: 4.0000 mg | ORAL_TABLET | Freq: Three times a day (TID) | ORAL | 0 refills | Status: DC | PRN

## 2022-03-13 NOTE — Anesthesia Postprocedure Evaluation (Signed)
Anesthesia Post Note ? ?Patient: Monica White ? ?Procedure(s) Performed: LAPAROSCOPIC REPAIR OF HIATAL HERNIA ? ?  ? ?Patient location during evaluation: PACU ?Anesthesia Type: General ?Level of consciousness: sedated and patient cooperative ?Pain management: pain level controlled ?Vital Signs Assessment: post-procedure vital signs reviewed and stable ?Respiratory status: spontaneous breathing ?Cardiovascular status: stable ?Anesthetic complications: no ? ? ?No notable events documented. ? ?Last Vitals:  ?Vitals:  ? 03/13/22 0053 03/13/22 2103  ?BP: 124/68 (!) 102/57  ?Pulse: 69 65  ?Resp: 18 18  ?Temp: 36.4 ?C 36.7 ?C  ?SpO2: 95% 97%  ?  ?Last Pain:  ?Vitals:  ? 03/13/22 0901  ?TempSrc:   ?PainSc: 8   ? ? ?  ?  ?  ?  ?  ?  ? ?Nolon Nations ? ? ? ? ?

## 2022-03-13 NOTE — Progress Notes (Signed)
Discharge instructions given to patient and all questions were answered.  

## 2022-03-13 NOTE — Discharge Summary (Signed)
Physician Discharge Summary  ?Patient ID: ?Monica White ?MRN: [id number] ?DOB/AGE: 81-Dec-1942 81 y.o. ? ?Admit date: 03/12/2022 ?Discharge date: 03/13/2022 ? ?Admission Diagnoses: Type III paraesophageal hernia ? ?Discharge Diagnoses:  ?Principal Problem: ?  Hiatal hernia ? ? ?Discharged Condition: good ? ?Hospital Course: She was admitted for routine postoperative care following laparoscopic paraesophageal hernia repair with toupee fundoplication.  Overnight she complained of headache, but on postop day 1 denies any chest pain, abdominal pain, nausea or dysphagia.  She has walked in the halls and in general is feeling well.  She is tolerating liquids without any difficulty.  Her vital signs are normal.  Lab work unremarkable.  She is deemed stable for discharge home ? ?Treatments: surgery: As above ? ?Discharge Exam: ?Blood pressure 114/62, pulse 69, temperature 98.1 ?F (36.7 ?C), temperature source Oral, resp. rate 18, weight 65.8 kg, last menstrual period 01/13/1994, SpO2 91 %. ?General appearance: alert and cooperative ?Resp: Normal effort ?Cardio: regular rate and rhythm ?GI: soft, non-tender; bowel sounds normal; no masses,  no organomegaly ?Skin: Skin color, texture, turgor normal. No rashes or lesions ?Neurologic: Grossly normal ?Incision/Wound: Incisions clean dry intact with Steri-Strips and Band-Aids.  No cellulitis or hematoma. ? ?Disposition: Discharge disposition: 01-Home or Self Care ? ? ? ? ? ? ? ?Allergies as of 03/13/2022   ? ?   Reactions  ? Atorvastatin Other (See Comments)  ? Stiffness  ? Escitalopram Other (See Comments)  ? Did not like medication  ? Pitavastatin Other (See Comments)  ? Stiffness  ? Tramadol Other (See Comments)  ? Did not like medication  ? ?  ? ?  ?Medication List  ?  ? ?TAKE these medications   ? ?acetaminophen 500 MG tablet ?Commonly known as: TYLENOL ?Take 2 tablets (1,000 mg total) by mouth every 6 (six) hours for 5 days. ?  ?albuterol 108 (90 Base) MCG/ACT inhaler ?Commonly  known as: VENTOLIN HFA ?Inhale 1-2 puffs into the lungs every 6 (six) hours as needed. ?  ?aspirin 325 MG tablet ?Take 1 tablet (325 mg total) by mouth daily. ?  ?B-12 1000 MCG Caps ?Take 1,000 mcg by mouth daily. ?  ?cholecalciferol 25 MCG (1000 UNIT) tablet ?Commonly known as: VITAMIN D3 ?Take 1,000 Units by mouth daily. ?  ?docusate sodium 100 MG capsule ?Commonly known as: Colace ?Take 1 capsule (100 mg total) by mouth 2 (two) times daily. Okay to decrease to once daily or stop taking if having loose bowel movements ?  ?estropipate 0.75 MG tablet ?Commonly known as: Ortho-Est 0.625 ?Take 1 tablet (0.75 mg total) by mouth daily. ?What changed: when to take this ?  ?ferrous sulfate 325 (65 FE) MG tablet ?Take 325 mg by mouth daily with breakfast. ?  ?FISH OIL PO ?Take 1 capsule by mouth daily. ?  ?levothyroxine 50 MCG tablet ?Commonly known as: SYNTHROID ?Take 50 mcg by mouth daily. ?  ?medroxyPROGESTERone 2.5 MG tablet ?Commonly known as: PROVERA ?Take 1 tablet (2.5 mg total) by mouth daily. ?  ?MULTIVITAMIN PO ?Take 1 tablet by mouth daily. ?  ?ondansetron 4 MG tablet ?Commonly known as: Zofran ?Take 1 tablet (4 mg total) by mouth every 8 (eight) hours as needed for nausea or vomiting. ?  ?oxyCODONE 5 MG immediate release tablet ?Commonly known as: Roxicodone ?Take 1 tablet (5 mg total) by mouth every 8 (eight) hours as needed. Alternate tylenol and ibuprofen for the first few days. Take narcotic pain medication only if needed for severe/ breakthrough pain. ?  ?pantoprazole  40 MG tablet ?Commonly known as: PROTONIX ?Take 40 mg by mouth 2 (two) times daily. ?  ?VITAMIN C PO ?Take 1 tablet by mouth daily. ?  ?zolpidem 5 MG tablet ?Commonly known as: AMBIEN ?Take 5 mg by mouth at bedtime as needed for sleep. ?  ? ?  ? ? Follow-up Information   ? ? Kinsinger, Arta Bruce, MD Follow up in 3 week(s).   ?Specialty: General Surgery ?Contact information: ?[address] ?[address] ?[place] [address] ?[phone] ? ? ?   ?  ? ?  ?  ? ?  ? ? ?Signed: ?Jenefer Woerner Rich Brave ?03/13/2022, 10:47 AM ? ? ?

## 2022-03-13 NOTE — Discharge Instructions (Signed)
POST OP INSTRUCTIONS ? ? ?EAT ?Start with a pureed / full liquid diet (see below) ?Gradually transition to a high fiber diet with a fiber supplement over the next month after discharge.  ? ?WALK ?Walk an hour a day.  Control your pain to do that.   ? ?CONTROL PAIN ?Control pain so that you can walk, sleep, tolerate sneezing/coughing, go up/down stairs. ? ?HAVE A BOWEL MOVEMENT DAILY ?Keep your bowels regular to avoid problems.  OK to try a laxative to override constipation.  OK to use an antidairrheal to slow down diarrhea.  Call if not better after 2 tries ? ?CALL IF YOU HAVE PROBLEMS/CONCERNS ?Call if you are still struggling despite following these instructions. ?Call if you have concerns not answered by these instructions ? ? ? ?DIET: see below ? ?Take your usually prescribed home medications unless otherwise directed. ? ?PAIN CONTROL: ?Pain is best controlled by a usual combination of three different methods TOGETHER: ?Ice/Heat ?Over the counter pain medication ?Prescription pain medication ?Most patients will experience some swelling and bruising around the incisions.  Ice packs or heating pads (30-60 minutes up to 6 times a day) will help. Use ice for the first few days to help decrease swelling and bruising, then switch to heat to help relax tight/sore spots and speed recovery.  Some people prefer to use ice alone, heat alone, alternating between ice & heat.  Experiment to what works for you.  Swelling and bruising can take several weeks to resolve.   ?It is helpful to take an over-the-counter pain medication regularly for the first few days: ?Naproxen (Aleve, etc)  Two '220mg'$  tabs twice a day OR Ibuprofen (Advil, etc) Three '200mg'$  tabs four times a day (every meal & bedtime) AND ?Acetaminophen (Tylenol, etc) 500-'650mg'$  four times a day (every meal & bedtime) ?A  prescription for pain medication (such as oxycodone, hydrocodone, tramadol, gabapentin, methocarbamol, etc) should be given to you upon discharge.   Take your pain medication as prescribed, IF NEEDED.  ?If you are having problems/concerns with the prescription medicine (does not control pain, nausea, vomiting, rash, itching, etc), please call us [phone] to see if we need to switch you to a different pain medicine that will work better for you and/or control your side effect better. ?If you need a refill on your pain medication, please give [place] 48 hour notice.  contact your pharmacy.  They will contact our office to request authorization. Prescriptions will not be filled after 5 pm or on week-ends ? ?Avoid getting constipated.   ?Between the surgery and the pain medications, it is common to experience some constipation.   ?Increasing fluid intake and taking a fiber supplement (such as Metamucil, Citrucel, FiberCon, MiraLax, etc) 1-2 times a day regularly will usually help prevent this problem from occurring.   ?A mild laxative (prune juice, Milk of Magnesia, MiraLax, etc) should be taken according to package directions if there are no bowel movements after 48 hours.   ?Watch out for diarrhea.   ?If you have many loose bowel movements, simplify your diet to bland foods & liquids for a few days.   ?Stop any stool softeners and decrease your fiber supplement.   ?Switching to mild anti-diarrheal medications (Kayopectate, Pepto Bismol) can help.   ?If this worsens or does not improve, please call us. ? ?Wash / shower every day.  You may shower over the skin glue or steri strips which are waterproof ? ?Glue will flake off after about 2 weeks  Steri strips will peel off after 1-2 weeks. You may remove bandaids 03/14/22.  You may leave the incision open to air.  You may replace a dressing/Band-Aid to cover the incision for comfort if you wish.  ? ?ACTIVITIES as tolerated:   ?You may resume regular (light) daily activities beginning the next day--such as daily self-care, walking, climbing stairs--gradually increasing activities as tolerated.  If you can walk 30  minutes without difficulty, it is safe to try more intense activity such as jogging, treadmill, bicycling, low-impact aerobics, swimming, etc. ?Refrain from the most intensive and strenuous activity such as sit-ups, heavy lifting, contact sports, etc  Refrain from any heavy lifting or straining until about 6 weeks after surgery.  ?DO NOT PUSH THROUGH PAIN.  Let pain be your guide: If it hurts to do something, don't do it.  Pain is your body warning you to avoid that activity for another week until the pain goes down. ?You may drive when you are no longer taking prescription pain medication, you can comfortably wear a seatbelt, and you can safely maneuver your car and apply brakes. ?You may have sexual intercourse when it is comfortable. ? ?FOLLOW UP in our office ?Please call [facility] at [phone] to set up an appointment to see your surgeon in the office for a follow-up appointment approximately 2-3 weeks after your surgery. ?Make sure that you call for this appointment the day you arrive home to insure a convenient appointment time. ? ?10. IF YOU HAVE DISABILITY OR FAMILY LEAVE FORMS, BRING THEM TO THE OFFICE FOR PROCESSING.  DO NOT GIVE THEM TO YOUR DOCTOR. ? ? ?WHEN TO CALL us [phone]: ?Poor pain control ?Reactions / problems with new medications (rash/itching, nausea, etc)  ?Fever over 101.5 F (38.5 C) ?Inability to urinate ?Nausea and/or vomiting ?Worsening swelling or bruising ?Continued bleeding from incision. ?Increased pain, redness, or drainage from the incision ? ? ? ? ?EATING AFTER YOUR HIATAL HERNIA REPAIR ? ? ?After your esophageal surgery, expect some sticking with swallowing over the next 1-2 months.   ? ?If food sticks when you eat, it is called "dysphagia".  This is due to swelling around your esophagus at the wrap & hiatal diaphragm repair.  It will gradually ease off over the next few months.  To help you through this temporary phase, we start you out on a pureed (blenderized)  diet. ? ?Your first meal in the hospital was thin liquids.  You should have been given a pureed diet by the time you left the hospital.  We ask patients to stay on a pureed diet for the first 2-3 weeks to avoid anything getting "stuck" near your recent surgery.  Don't be alarmed if your ability to swallow doesn't progress according to this plan.  Everyone is different and some diets can advance more or less quickly.   ? ?It is often helpful to crush your medications or split them as they can sometimes stick, especially the first week or so. ? ? ?Some BASIC RULES to follow are: ?Maintain an upright position whenever eating or drinking. ?Take small bites - just a teaspoon size bite at a time. ?Eat slowly.  It may also help to eat only one food at a time. ?Consider nibbling through smaller, more frequent meals & avoid the urge to eat BIG meals ?Do not push through feelings of fullness, nausea, or bloatedness ?Do not mix solid foods and liquids in the same mouthful ?Try not to "wash foods down" with large  gulps of liquids. ?Avoid carbonated (bubbly/fizzy) drinks.   ?Avoid foods that make you feel gassy or bloated.  Start with bland foods first.  Wait on trying greasy, fried, or spicy meals until you are tolerating more bland solids well. ?Understand that it will be hard to burp and belch at first.  This gradually improves with time.  Expect to be more gassy/flatulent/bloated initially.  Walking will help your body manage it better. ?Consider using medications for bloating that contain simethicone such as  Maalox or Gas-X  ?Consider crushing her medications, especially smaller pills.  The ability to swallow pills should get easier after a few weeks ?Eat in a relaxed atmosphere & minimize distractions. ?Avoid talking while eating.   ?Do not use straws. ?Following each meal, sit in an upright position (90 degree angle) for 60 to 90 minutes.  Going for a short walk can help as well ?If food does stick, don't panic.  Try to  relax and let the food pass on its own.  Sipping WARM LIQUID such as strong hot black tea can also help slide it down. ? ? ?Be gradual in changes & use common sense: ? ?-If you easily tolerating a certain "level" of foo

## 2022-03-21 DIAGNOSIS — H349 Unspecified retinal vascular occlusion: Secondary | ICD-10-CM | POA: Diagnosis not present

## 2022-03-21 DIAGNOSIS — H353132 Nonexudative age-related macular degeneration, bilateral, intermediate dry stage: Secondary | ICD-10-CM | POA: Diagnosis not present

## 2022-03-21 DIAGNOSIS — H52203 Unspecified astigmatism, bilateral: Secondary | ICD-10-CM | POA: Diagnosis not present

## 2022-03-21 DIAGNOSIS — H2513 Age-related nuclear cataract, bilateral: Secondary | ICD-10-CM | POA: Diagnosis not present

## 2022-04-12 DIAGNOSIS — M6281 Muscle weakness (generalized): Secondary | ICD-10-CM | POA: Diagnosis not present

## 2022-04-12 DIAGNOSIS — M6289 Other specified disorders of muscle: Secondary | ICD-10-CM | POA: Diagnosis not present

## 2022-04-18 DIAGNOSIS — M6289 Other specified disorders of muscle: Secondary | ICD-10-CM | POA: Diagnosis not present

## 2022-04-18 DIAGNOSIS — M6281 Muscle weakness (generalized): Secondary | ICD-10-CM | POA: Diagnosis not present

## 2022-04-26 DIAGNOSIS — M6281 Muscle weakness (generalized): Secondary | ICD-10-CM | POA: Diagnosis not present

## 2022-04-26 DIAGNOSIS — M6289 Other specified disorders of muscle: Secondary | ICD-10-CM | POA: Diagnosis not present

## 2022-05-02 DIAGNOSIS — M6281 Muscle weakness (generalized): Secondary | ICD-10-CM | POA: Diagnosis not present

## 2022-05-02 DIAGNOSIS — M6289 Other specified disorders of muscle: Secondary | ICD-10-CM | POA: Diagnosis not present

## 2022-05-04 DIAGNOSIS — M6281 Muscle weakness (generalized): Secondary | ICD-10-CM | POA: Diagnosis not present

## 2022-05-04 DIAGNOSIS — M6289 Other specified disorders of muscle: Secondary | ICD-10-CM | POA: Diagnosis not present

## 2022-05-09 DIAGNOSIS — M6281 Muscle weakness (generalized): Secondary | ICD-10-CM | POA: Diagnosis not present

## 2022-05-09 DIAGNOSIS — M6289 Other specified disorders of muscle: Secondary | ICD-10-CM | POA: Diagnosis not present

## 2022-05-16 DIAGNOSIS — M6289 Other specified disorders of muscle: Secondary | ICD-10-CM | POA: Diagnosis not present

## 2022-05-16 DIAGNOSIS — M6281 Muscle weakness (generalized): Secondary | ICD-10-CM | POA: Diagnosis not present

## 2022-05-18 DIAGNOSIS — M6281 Muscle weakness (generalized): Secondary | ICD-10-CM | POA: Diagnosis not present

## 2022-05-18 DIAGNOSIS — M6289 Other specified disorders of muscle: Secondary | ICD-10-CM | POA: Diagnosis not present

## 2022-05-19 DIAGNOSIS — M1712 Unilateral primary osteoarthritis, left knee: Secondary | ICD-10-CM | POA: Diagnosis not present

## 2022-05-19 DIAGNOSIS — M170 Bilateral primary osteoarthritis of knee: Secondary | ICD-10-CM | POA: Diagnosis not present

## 2022-05-19 DIAGNOSIS — M1711 Unilateral primary osteoarthritis, right knee: Secondary | ICD-10-CM | POA: Diagnosis not present

## 2022-05-23 DIAGNOSIS — M6289 Other specified disorders of muscle: Secondary | ICD-10-CM | POA: Diagnosis not present

## 2022-05-23 DIAGNOSIS — M6281 Muscle weakness (generalized): Secondary | ICD-10-CM | POA: Diagnosis not present

## 2022-05-25 DIAGNOSIS — M6289 Other specified disorders of muscle: Secondary | ICD-10-CM | POA: Diagnosis not present

## 2022-05-25 DIAGNOSIS — M6281 Muscle weakness (generalized): Secondary | ICD-10-CM | POA: Diagnosis not present

## 2022-06-01 DIAGNOSIS — M6289 Other specified disorders of muscle: Secondary | ICD-10-CM | POA: Diagnosis not present

## 2022-06-01 DIAGNOSIS — M6281 Muscle weakness (generalized): Secondary | ICD-10-CM | POA: Diagnosis not present

## 2022-06-08 DIAGNOSIS — M6281 Muscle weakness (generalized): Secondary | ICD-10-CM | POA: Diagnosis not present

## 2022-06-08 DIAGNOSIS — M6289 Other specified disorders of muscle: Secondary | ICD-10-CM | POA: Diagnosis not present

## 2022-06-11 DIAGNOSIS — M6281 Muscle weakness (generalized): Secondary | ICD-10-CM | POA: Diagnosis not present

## 2022-06-11 DIAGNOSIS — M6289 Other specified disorders of muscle: Secondary | ICD-10-CM | POA: Diagnosis not present

## 2022-06-13 DIAGNOSIS — M6281 Muscle weakness (generalized): Secondary | ICD-10-CM | POA: Diagnosis not present

## 2022-06-13 DIAGNOSIS — M6289 Other specified disorders of muscle: Secondary | ICD-10-CM | POA: Diagnosis not present

## 2022-06-20 DIAGNOSIS — M6289 Other specified disorders of muscle: Secondary | ICD-10-CM | POA: Diagnosis not present

## 2022-06-20 DIAGNOSIS — M6281 Muscle weakness (generalized): Secondary | ICD-10-CM | POA: Diagnosis not present

## 2022-07-10 DIAGNOSIS — R143 Flatulence: Secondary | ICD-10-CM | POA: Diagnosis not present

## 2022-07-10 DIAGNOSIS — K449 Diaphragmatic hernia without obstruction or gangrene: Secondary | ICD-10-CM | POA: Diagnosis not present

## 2022-07-10 DIAGNOSIS — D509 Iron deficiency anemia, unspecified: Secondary | ICD-10-CM | POA: Diagnosis not present

## 2022-07-10 DIAGNOSIS — K259 Gastric ulcer, unspecified as acute or chronic, without hemorrhage or perforation: Secondary | ICD-10-CM | POA: Diagnosis not present

## 2022-07-10 DIAGNOSIS — K269 Duodenal ulcer, unspecified as acute or chronic, without hemorrhage or perforation: Secondary | ICD-10-CM | POA: Diagnosis not present

## 2022-07-18 DIAGNOSIS — Z0000 Encounter for general adult medical examination without abnormal findings: Secondary | ICD-10-CM | POA: Diagnosis not present

## 2022-07-18 DIAGNOSIS — E559 Vitamin D deficiency, unspecified: Secondary | ICD-10-CM | POA: Diagnosis not present

## 2022-07-18 DIAGNOSIS — D509 Iron deficiency anemia, unspecified: Secondary | ICD-10-CM | POA: Diagnosis not present

## 2022-07-18 DIAGNOSIS — E785 Hyperlipidemia, unspecified: Secondary | ICD-10-CM | POA: Diagnosis not present

## 2022-07-18 DIAGNOSIS — E039 Hypothyroidism, unspecified: Secondary | ICD-10-CM | POA: Diagnosis not present

## 2022-07-18 DIAGNOSIS — E538 Deficiency of other specified B group vitamins: Secondary | ICD-10-CM | POA: Diagnosis not present

## 2022-07-18 DIAGNOSIS — R7989 Other specified abnormal findings of blood chemistry: Secondary | ICD-10-CM | POA: Diagnosis not present

## 2022-07-18 DIAGNOSIS — E7800 Pure hypercholesterolemia, unspecified: Secondary | ICD-10-CM | POA: Diagnosis not present

## 2022-07-25 DIAGNOSIS — E538 Deficiency of other specified B group vitamins: Secondary | ICD-10-CM | POA: Diagnosis not present

## 2022-07-25 DIAGNOSIS — Z1339 Encounter for screening examination for other mental health and behavioral disorders: Secondary | ICD-10-CM | POA: Diagnosis not present

## 2022-07-25 DIAGNOSIS — E785 Hyperlipidemia, unspecified: Secondary | ICD-10-CM | POA: Diagnosis not present

## 2022-07-25 DIAGNOSIS — F329 Major depressive disorder, single episode, unspecified: Secondary | ICD-10-CM | POA: Diagnosis not present

## 2022-07-25 DIAGNOSIS — I773 Arterial fibromuscular dysplasia: Secondary | ICD-10-CM | POA: Diagnosis not present

## 2022-07-25 DIAGNOSIS — R413 Other amnesia: Secondary | ICD-10-CM | POA: Diagnosis not present

## 2022-07-25 DIAGNOSIS — R82998 Other abnormal findings in urine: Secondary | ICD-10-CM | POA: Diagnosis not present

## 2022-07-25 DIAGNOSIS — Z0000 Encounter for general adult medical examination without abnormal findings: Secondary | ICD-10-CM | POA: Diagnosis not present

## 2022-07-25 DIAGNOSIS — E039 Hypothyroidism, unspecified: Secondary | ICD-10-CM | POA: Diagnosis not present

## 2022-07-25 DIAGNOSIS — I2510 Atherosclerotic heart disease of native coronary artery without angina pectoris: Secondary | ICD-10-CM | POA: Diagnosis not present

## 2022-07-25 DIAGNOSIS — I872 Venous insufficiency (chronic) (peripheral): Secondary | ICD-10-CM | POA: Diagnosis not present

## 2022-07-25 DIAGNOSIS — Z1331 Encounter for screening for depression: Secondary | ICD-10-CM | POA: Diagnosis not present

## 2022-07-25 DIAGNOSIS — I700 Atherosclerosis of aorta: Secondary | ICD-10-CM | POA: Diagnosis not present

## 2022-07-25 DIAGNOSIS — I509 Heart failure, unspecified: Secondary | ICD-10-CM | POA: Diagnosis not present

## 2022-08-22 DIAGNOSIS — M1712 Unilateral primary osteoarthritis, left knee: Secondary | ICD-10-CM | POA: Diagnosis not present

## 2022-08-22 DIAGNOSIS — M1711 Unilateral primary osteoarthritis, right knee: Secondary | ICD-10-CM | POA: Diagnosis not present

## 2022-08-22 DIAGNOSIS — M170 Bilateral primary osteoarthritis of knee: Secondary | ICD-10-CM | POA: Diagnosis not present

## 2022-08-23 DIAGNOSIS — Z780 Asymptomatic menopausal state: Secondary | ICD-10-CM | POA: Diagnosis not present

## 2022-08-23 DIAGNOSIS — Z23 Encounter for immunization: Secondary | ICD-10-CM | POA: Diagnosis not present

## 2022-08-23 DIAGNOSIS — F4321 Adjustment disorder with depressed mood: Secondary | ICD-10-CM | POA: Diagnosis not present

## 2022-08-23 DIAGNOSIS — R413 Other amnesia: Secondary | ICD-10-CM | POA: Diagnosis not present

## 2022-08-23 DIAGNOSIS — R4181 Age-related cognitive decline: Secondary | ICD-10-CM | POA: Diagnosis not present

## 2022-09-06 ENCOUNTER — Other Ambulatory Visit (HOSPITAL_COMMUNITY): Payer: Self-pay | Admitting: *Deleted

## 2022-09-07 ENCOUNTER — Encounter (HOSPITAL_COMMUNITY)
Admission: RE | Admit: 2022-09-07 | Discharge: 2022-09-07 | Disposition: A | Discharge status: Home / Self Care | Payer: Medicare Other | Source: Ambulatory Visit | Attending: Internal Medicine | Admitting: Internal Medicine

## 2022-09-07 DIAGNOSIS — M810 Age-related osteoporosis without current pathological fracture: Secondary | ICD-10-CM | POA: Insufficient documentation

## 2022-09-07 MED ORDER — DENOSUMAB 60 MG/ML ~~LOC~~ SOSY
PREFILLED_SYRINGE | SUBCUTANEOUS | Status: AC
Start: 2022-09-07 — End: 2022-09-07
  Administered 2022-09-07: 60 mg via SUBCUTANEOUS
  Filled 2022-09-07: qty 1

## 2022-09-07 MED ORDER — DENOSUMAB 60 MG/ML ~~LOC~~ SOSY: 60.0000 mg | PREFILLED_SYRINGE | Freq: Once | SUBCUTANEOUS | Status: AC

## 2022-09-11 ENCOUNTER — Other Ambulatory Visit (HOSPITAL_COMMUNITY): Payer: Self-pay | Admitting: *Deleted

## 2022-09-12 ENCOUNTER — Encounter (HOSPITAL_COMMUNITY)
Admission: RE | Admit: 2022-09-12 | Discharge: 2022-09-12 | Disposition: A | Discharge status: Home / Self Care | Payer: Medicare Other | Source: Ambulatory Visit | Attending: Internal Medicine | Admitting: Internal Medicine

## 2022-09-12 DIAGNOSIS — M810 Age-related osteoporosis without current pathological fracture: Secondary | ICD-10-CM | POA: Diagnosis not present

## 2022-09-12 MED ORDER — INCLISIRAN SODIUM 284 MG/1.5ML ~~LOC~~ SOSY
PREFILLED_SYRINGE | SUBCUTANEOUS | Status: AC
  Filled 2022-09-12: qty 1.5

## 2022-09-12 MED ORDER — INCLISIRAN SODIUM 284 MG/1.5ML ~~LOC~~ SOSY
284.0000 mg | PREFILLED_SYRINGE | Freq: Once | SUBCUTANEOUS | Status: AC
  Administered 2022-09-12: 284 mg via SUBCUTANEOUS

## 2022-10-31 ENCOUNTER — Emergency Department (HOSPITAL_COMMUNITY): Payer: Medicare Other

## 2022-10-31 ENCOUNTER — Encounter (HOSPITAL_COMMUNITY): Payer: Self-pay

## 2022-10-31 ENCOUNTER — Inpatient Hospital Stay (HOSPITAL_COMMUNITY)
Admission: EM | Admit: 2022-10-31 | Discharge: 2022-11-09 | DRG: 328 | Disposition: A | Discharge status: Home / Self Care | Payer: Medicare Other | Attending: General Surgery | Admitting: General Surgery

## 2022-10-31 ENCOUNTER — Other Ambulatory Visit: Payer: Self-pay

## 2022-10-31 DIAGNOSIS — J9811 Atelectasis: Secondary | ICD-10-CM | POA: Diagnosis not present

## 2022-10-31 DIAGNOSIS — I447 Left bundle-branch block, unspecified: Secondary | ICD-10-CM | POA: Diagnosis not present

## 2022-10-31 DIAGNOSIS — E039 Hypothyroidism, unspecified: Secondary | ICD-10-CM | POA: Diagnosis present

## 2022-10-31 DIAGNOSIS — Z8249 Family history of ischemic heart disease and other diseases of the circulatory system: Secondary | ICD-10-CM

## 2022-10-31 DIAGNOSIS — Z885 Allergy status to narcotic agent status: Secondary | ICD-10-CM | POA: Diagnosis not present

## 2022-10-31 DIAGNOSIS — K219 Gastro-esophageal reflux disease without esophagitis: Secondary | ICD-10-CM | POA: Diagnosis present

## 2022-10-31 DIAGNOSIS — Z79899 Other long term (current) drug therapy: Secondary | ICD-10-CM

## 2022-10-31 DIAGNOSIS — K449 Diaphragmatic hernia without obstruction or gangrene: Secondary | ICD-10-CM

## 2022-10-31 DIAGNOSIS — I499 Cardiac arrhythmia, unspecified: Secondary | ICD-10-CM | POA: Diagnosis not present

## 2022-10-31 DIAGNOSIS — Z1152 Encounter for screening for COVID-19: Secondary | ICD-10-CM | POA: Diagnosis not present

## 2022-10-31 DIAGNOSIS — K440 Diaphragmatic hernia with obstruction, without gangrene: Secondary | ICD-10-CM | POA: Diagnosis not present

## 2022-10-31 DIAGNOSIS — R109 Unspecified abdominal pain: Secondary | ICD-10-CM | POA: Diagnosis not present

## 2022-10-31 DIAGNOSIS — E7800 Pure hypercholesterolemia, unspecified: Secondary | ICD-10-CM | POA: Diagnosis present

## 2022-10-31 DIAGNOSIS — R0902 Hypoxemia: Secondary | ICD-10-CM | POA: Diagnosis not present

## 2022-10-31 DIAGNOSIS — Z888 Allergy status to other drugs, medicaments and biological substances status: Secondary | ICD-10-CM | POA: Diagnosis not present

## 2022-10-31 DIAGNOSIS — Z79890 Hormone replacement therapy (postmenopausal): Secondary | ICD-10-CM | POA: Diagnosis not present

## 2022-10-31 DIAGNOSIS — M1990 Unspecified osteoarthritis, unspecified site: Secondary | ICD-10-CM | POA: Diagnosis not present

## 2022-10-31 DIAGNOSIS — R0789 Other chest pain: Secondary | ICD-10-CM | POA: Diagnosis not present

## 2022-10-31 DIAGNOSIS — K224 Dyskinesia of esophagus: Secondary | ICD-10-CM | POA: Diagnosis not present

## 2022-10-31 DIAGNOSIS — R079 Chest pain, unspecified: Secondary | ICD-10-CM | POA: Diagnosis not present

## 2022-10-31 NOTE — ED Provider Triage Note (Signed)
  Emergency Medicine Provider Triage Evaluation Note  MRN:  [id number]  Arrival date & time: 11/01/22    Medically screening exam initiated at 4:18 AM.   CC:   Chest Pain   HPI:  Monica White is a 81 y.o. year-old female presents to the ED with chief complaint of generalized body aches that moved into her shoulders and left chest pain.  Onset tonight while eating dinner.  States that she has had similar in the past because of her hiatal hernia.  Reports some initial nausea.  EMS reports that O2 on arrival was 85%.  Has had some diarrhea for the past few days.    History provided by patient. ROS:  -As included in HPI PE:   Vitals:   10/31/22 2237 11/01/22 0306  BP: 116/71 127/72  Pulse: 86 83  Resp: 18 19  Temp: (!) 97.3 F (36.3 C) 98 F (36.7 C)  SpO2: 95% 97%    Non-toxic appearing No respiratory distress  MDM:  Based on signs and symptoms, flu is highest on my differential, followed by ACS. I've ordered labs and imaging in triage to expedite lab/diagnostic workup.  Patient was informed that the remainder of the evaluation will be completed by another provider, this initial triage assessment does not replace that evaluation, and the importance of remaining in the ED until their evaluation is complete.    Montine Circle, PA-C 11/01/22 [phone]

## 2022-10-31 NOTE — ED Triage Notes (Signed)
PER EMS: pt is from home with c/o back ache, bilateral shoulder pain as well as left sided chest pain associated with nausea onset today. Initial O2 sats was 85% and was placed on 2L [place] en route and was given 1 SL nitro tab and 324 aspirin as well as 2mg of Fentanyl IV.  20g LAC  BP- 127/60, HR- 82

## 2022-11-01 ENCOUNTER — Emergency Department (HOSPITAL_COMMUNITY): Payer: Medicare Other

## 2022-11-01 DIAGNOSIS — R109 Unspecified abdominal pain: Secondary | ICD-10-CM | POA: Diagnosis not present

## 2022-11-01 DIAGNOSIS — K449 Diaphragmatic hernia without obstruction or gangrene: Secondary | ICD-10-CM

## 2022-11-01 LAB — BASIC METABOLIC PANEL
Anion gap: 12 (ref 5–15)
BUN: 22 mg/dL (ref 8–23)
CO2: 22 mmol/L (ref 22–32)
Calcium: 8.5 mg/dL — ABNORMAL LOW (ref 8.9–10.3)
Chloride: 107 mmol/L (ref 98–111)
Creatinine, Ser: 0.75 mg/dL (ref 0.44–1.00)
GFR, Estimated: >60 mL/min (ref >60)
Glucose, Bld: 166 mg/dL — ABNORMAL HIGH (ref 70–99)
Potassium: 3.8 mmol/L (ref 3.5–5.1)
Sodium: 141 mmol/L (ref 135–145)

## 2022-11-01 LAB — CBC
HCT: 42.5 % (ref 36.0–46.0)
Hemoglobin: 14.3 g/dL (ref 12.0–15.0)
MCH: 32.2 pg (ref 26.0–34.0)
MCHC: 33.6 g/dL (ref 30.0–36.0)
MCV: 95.7 fL (ref 80.0–100.0)
Platelets: 454 10*3/uL — ABNORMAL HIGH (ref 150–400)
RBC: 4.44 MIL/uL (ref 3.87–5.11)
RDW: 12.5 % (ref 11.5–15.5)
WBC: 8.8 10*3/uL (ref 4.0–10.5)
nRBC: 0 % (ref 0.0–0.2)

## 2022-11-01 LAB — RESP PANEL BY RT-PCR (FLU A&B, COVID) ARPGX2
Influenza A by PCR: NEGATIVE
Influenza B by PCR: NEGATIVE
SARS Coronavirus 2 by RT PCR: NEGATIVE

## 2022-11-01 LAB — TROPONIN I (HIGH SENSITIVITY)
Troponin I (High Sensitivity): 10 ng/L (ref <18)
Troponin I (High Sensitivity): 10 ng/L (ref <18)

## 2022-11-01 MED ORDER — PANTOPRAZOLE SODIUM 40 MG IV SOLR
40.0000 mg | Freq: Every day | INTRAVENOUS | Status: DC
  Administered 2022-11-01 – 2022-11-05 (×5): 40 mg via INTRAVENOUS
  Filled 2022-11-01 (×5): qty 10

## 2022-11-01 MED ORDER — DIPHENHYDRAMINE HCL 12.5 MG/5ML PO ELIX: 12.5000 mg | ORAL_SOLUTION | Freq: Four times a day (QID) | ORAL | Status: DC | PRN

## 2022-11-01 MED ORDER — SODIUM CHLORIDE 0.45 % IV SOLN: INTRAVENOUS | Status: DC

## 2022-11-01 MED ORDER — SIMETHICONE 80 MG PO CHEW
40.0000 mg | CHEWABLE_TABLET | Freq: Four times a day (QID) | ORAL | Status: DC | PRN
  Administered 2022-11-01: 40 mg via ORAL
  Filled 2022-11-01: qty 1

## 2022-11-01 MED ORDER — OXYCODONE HCL 5 MG PO TABS
5.0000 mg | ORAL_TABLET | ORAL | Status: DC | PRN
  Administered 2022-11-03 (×2): 5 mg via ORAL
  Filled 2022-11-01 (×2): qty 1

## 2022-11-01 MED ORDER — PANTOPRAZOLE SODIUM 40 MG IV SOLR
40.0000 mg | Freq: Once | INTRAVENOUS | Status: AC
  Administered 2022-11-01: 40 mg via INTRAVENOUS
  Filled 2022-11-01: qty 10

## 2022-11-01 MED ORDER — ACETAMINOPHEN 650 MG RE SUPP: 650.0000 mg | Freq: Four times a day (QID) | RECTAL | Status: DC | PRN

## 2022-11-01 MED ORDER — IOHEXOL 350 MG/ML SOLN
75.0000 mL | Freq: Once | INTRAVENOUS | Status: AC | PRN
  Administered 2022-11-01: 75 mL via INTRAVENOUS

## 2022-11-01 MED ORDER — ENOXAPARIN SODIUM 40 MG/0.4ML IJ SOSY: 40.0000 mg | PREFILLED_SYRINGE | INTRAMUSCULAR | Status: DC

## 2022-11-01 MED ORDER — METHOCARBAMOL 500 MG PO TABS: 500.0000 mg | ORAL_TABLET | Freq: Three times a day (TID) | ORAL | Status: DC | PRN

## 2022-11-01 MED ORDER — POLYETHYLENE GLYCOL 3350 17 G PO PACK: 17.0000 g | PACK | Freq: Every day | ORAL | Status: DC | PRN

## 2022-11-01 MED ORDER — DIPHENHYDRAMINE HCL 50 MG/ML IJ SOLN
12.5000 mg | Freq: Four times a day (QID) | INTRAMUSCULAR | Status: DC | PRN
  Administered 2022-11-05: 12.5 mg via INTRAVENOUS
  Filled 2022-11-01: qty 1

## 2022-11-01 MED ORDER — ONDANSETRON 4 MG PO TBDP
4.0000 mg | ORAL_TABLET | Freq: Four times a day (QID) | ORAL | Status: DC | PRN
  Administered 2022-11-02: 4 mg via ORAL
  Filled 2022-11-01: qty 1

## 2022-11-01 MED ORDER — ACETAMINOPHEN 325 MG PO TABS
650.0000 mg | ORAL_TABLET | Freq: Four times a day (QID) | ORAL | Status: DC | PRN
  Administered 2022-11-01 – 2022-11-03 (×4): 650 mg via ORAL
  Filled 2022-11-01 (×5): qty 2

## 2022-11-01 MED ORDER — PROCHLORPERAZINE EDISYLATE 10 MG/2ML IJ SOLN: 5.0000 mg | Freq: Four times a day (QID) | INTRAMUSCULAR | Status: DC | PRN

## 2022-11-01 MED ORDER — PROCHLORPERAZINE MALEATE 10 MG PO TABS: 10.0000 mg | ORAL_TABLET | Freq: Four times a day (QID) | ORAL | Status: DC | PRN

## 2022-11-01 MED ORDER — METHOCARBAMOL 1000 MG/10ML IJ SOLN: 500.0000 mg | Freq: Three times a day (TID) | INTRAVENOUS | Status: DC | PRN

## 2022-11-01 MED ORDER — MORPHINE SULFATE (PF) 2 MG/ML IV SOLN: 2.0000 mg | INTRAVENOUS | Status: DC | PRN

## 2022-11-01 MED ORDER — ONDANSETRON HCL 4 MG/2ML IJ SOLN
4.0000 mg | Freq: Four times a day (QID) | INTRAMUSCULAR | Status: DC | PRN
  Administered 2022-11-01 – 2022-11-06 (×10): 4 mg via INTRAVENOUS
  Filled 2022-11-01 (×10): qty 2

## 2022-11-01 MED ORDER — HYDRALAZINE HCL 20 MG/ML IJ SOLN: 10.0000 mg | INTRAMUSCULAR | Status: DC | PRN

## 2022-11-01 MED ORDER — ENOXAPARIN SODIUM 40 MG/0.4ML IJ SOSY
40.0000 mg | PREFILLED_SYRINGE | INTRAMUSCULAR | Status: DC
  Administered 2022-11-01 – 2022-11-04 (×4): 40 mg via SUBCUTANEOUS
  Filled 2022-11-01 (×5): qty 0.4

## 2022-11-01 NOTE — ED Notes (Signed)
ED TO INPATIENT HANDOFF REPORT  ED Nurse Name and Phone #: Amelda Hapke RN [phone]  S Name/Age/Gender [facility] 81 y.o. female Room/Bed: 039C/039C  Code Status   Code Status: Full Code  Home/SNF/Other Home Patient oriented to: self, place, time, and situation Is this baseline? Yes   Triage Complete: Triage complete  Chief Complaint Diaphragmatic hernia [K44.9]  Triage Note PER EMS: pt is from home with c/o back ache, bilateral shoulder pain as well as left sided chest pain associated with nausea onset today. Initial O2 sats was 85% and was placed on 2L [place] en route and was given 1 SL nitro tab and 324 aspirin as well as 54mg of Fentanyl IV.  20g LAC  BP- 127/60, HR- 82   Allergies Allergies  Allergen Reactions   Atorvastatin Other (See Comments)    Stiffness   Escitalopram Other (See Comments)    Stiffness   Pitavastatin Other (See Comments)    Stiffness   Tramadol Other (See Comments)    "Felt bad and headache"    Level of Care/Admitting Diagnosis ED Disposition     ED Disposition  Admit   Condition  --   [facility]  Level of Care: Med-Surg [16]  [facility]:: No  Covid Evaluation: Asymptomatic - no recent exposure (last 10 days) testing not required  Diagnosis: Diaphragmatic hernia [197820]  Admitting Physician: KMickeal Skinner[[[id number]] Attending Physician: KMickeal Skinner[[[id number]]         B Medical/Surgery History Past Medical History:  Diagnosis Date   Anemia    Arthritis    Dyspnea    GERD (gastroesophageal reflux disease)    History of hiatal hernia    Hypercholesteremia    Hypothyroid 2015   Past Surgical History:  Procedure Laterality Date   COLONOSCOPY     HIATAL HERNIA REPAIR N/A 03/12/2022   Procedure: LAPAROSCOPIC REPAIR OF HIATAL HERNIA;  Surgeon: KMickeal Skinner MD;   Location: [facility];  Service: General;  Laterality: N/A;   RIGHT HEART CATH AND CORONARY ANGIOGRAPHY N/A 12/01/2021   Procedure: RIGHT HEART CATH AND CORONARY ANGIOGRAPHY;  Surgeon: CSherren Mocha MD;  [facility];  Service: Cardiovascular;  Laterality: N/A;   TUBAL LIGATION  11/27/1983   UPPER GASTROINTESTINAL ENDOSCOPY       A IV Location/Drains/Wounds Patient Lines/Drains/Airways Status     Active Line/Drains/Airways     Name Placement date Placement time Site Days   Peripheral IV 10/31/22 20 G Left Antecubital 10/31/22  2242  Antecubital  1   Peripheral IV 11/01/22 20 G Left;Posterior Hand 11/01/22  1421  Hand  less than 1   Incision (Closed) 03/12/22 Abdomen 03/12/22  1330  -- 234   Incision - 6 Ports Abdomen 1: Left;Lateral 2: Left 3: Umbilicus 4: Right 5: Right;Lateral 6: Upper 03/12/22  1210  -- 234            Intake/Output Last 24 hours No intake or output data in the 24 hours ending 11/01/22 1921  Labs/Imaging Results for orders placed or performed during the hospital encounter of 10/31/22 (from the past 48 hour(s))  Resp Panel by RT-PCR (Flu A&B, Covid) Anterior Nasal Swab     Status: None   Collection Time: 10/31/22 10:32 PM   Specimen: Anterior Nasal Swab  Result Value Ref Range   SARS Coronavirus  2 by RT PCR NEGATIVE NEGATIVE    Comment: (NOTE) SARS-CoV-2 target nucleic acids are NOT DETECTED.  The SARS-CoV-2 RNA is generally detectable in upper respiratory specimens during the acute phase of infection. The lowest concentration of SARS-CoV-2 viral copies this assay can detect is 138 copies/mL. A negative result does not preclude SARS-Cov-2 infection and should not be used as the sole basis for treatment or other patient management decisions. A negative result may occur with  improper specimen collection/handling, submission of specimen other than nasopharyngeal swab, presence of viral mutation(s) within the areas targeted by this assay, and  inadequate number of viral copies(<138 copies/mL). A negative result must be combined with clinical observations, patient history, and epidemiological information. The expected result is Negative.  Fact Sheet for Patients:  EntrepreneurPulse.com.au  Fact Sheet for Healthcare Providers:  IncredibleEmployment.be  This test is no t yet approved or cleared by the [place] FDA and  has been authorized for detection and/or diagnosis of SARS-CoV-2 by FDA under an Emergency Use Authorization (EUA). This EUA will remain  in effect (meaning this test can be used) for the duration of the COVID-19 declaration under Section 564(b)(1) of the Act, 21 U.S.C.section 360bbb-3(b)(1), unless the authorization is terminated  or revoked sooner.       Influenza A by PCR NEGATIVE NEGATIVE   Influenza B by PCR NEGATIVE NEGATIVE    Comment: (NOTE) The Xpert Xpress SARS-CoV-2/FLU/RSV plus assay is intended as an aid in the diagnosis of influenza from Nasopharyngeal swab specimens and should not be used as a sole basis for treatment. Nasal washings and aspirates are unacceptable for Xpert Xpress SARS-CoV-2/FLU/RSV testing.  Fact Sheet for Patients: EntrepreneurPulse.com.au  Fact Sheet for Healthcare Providers: IncredibleEmployment.be  This test is not yet approved or cleared by the [place] FDA and has been authorized for detection and/or diagnosis of SARS-CoV-2 by FDA under an Emergency Use Authorization (EUA). This EUA will remain in effect (meaning this test can be used) for the duration of the COVID-19 declaration under Section 564(b)(1) of the Act, 21 U.S.C. section 360bbb-3(b)(1), unless the authorization is terminated or revoked.  Performed at [facility], [place] [address], [place], [place] [address]   Basic metabolic panel     Status: Abnormal   Collection Time: 10/31/22 10:41 PM  Result Value Ref Range    Sodium 141 135 - 145 mmol/L   Potassium 3.8 3.5 - 5.1 mmol/L   Chloride 107 98 - 111 mmol/L   CO2 22 22 - 32 mmol/L   Glucose, Bld 166 (H) 70 - 99 mg/dL    Comment: Glucose reference range applies only to samples taken after fasting for at least 8 hours.   BUN 22 8 - 23 mg/dL   Creatinine, Ser 0.75 0.44 - 1.00 mg/dL   Calcium 8.5 (L) 8.9 - 10.3 mg/dL   GFR, Estimated >60 >60 mL/min    Comment: (NOTE) Calculated using the CKD-EPI Creatinine Equation (2021)    Anion gap 12 5 - 15    Comment: Performed at [facility] [address], [place] [address]  CBC     Status: Abnormal   Collection Time: 10/31/22 10:41 PM  Result Value Ref Range   WBC 8.8 4.0 - 10.5 K/uL   RBC 4.44 3.87 - 5.11 MIL/uL   Hemoglobin 14.3 12.0 - 15.0 g/dL   HCT 42.5 36.0 - 46.0 %   MCV 95.7 80.0 - 100.0 fL   MCH 32.2 26.0 - 34.0  pg   MCHC 33.6 30.0 - 36.0 g/dL   RDW 12.5 11.5 - 15.5 %   Platelets 454 (H) 150 - 400 K/uL   nRBC 0.0 0.0 - 0.2 %    Comment: Performed at [facility], [place] [address], [place], [place] [address]  Troponin I (High Sensitivity)     Status: None   Collection Time: 10/31/22 10:41 PM  Result Value Ref Range   Troponin I (High Sensitivity) 10 <18 ng/L    Comment: (NOTE) Elevated high sensitivity troponin I (hsTnI) values and significant  changes across serial measurements may suggest ACS but many other  chronic and acute conditions are known to elevate hsTnI results.  Refer to the "Links" section for chest pain algorithms and additional  guidance. Performed at [facility], [place] [address] [place], [place] [address]   Troponin I (High Sensitivity)     Status: None   Collection Time: 11/01/22  2:04 AM  Result Value Ref Range   Troponin I (High Sensitivity) 10 <18 ng/L    Comment: (NOTE) Elevated high sensitivity troponin I (hsTnI) values and significant  changes across serial measurements may suggest ACS but many other  chronic and acute conditions  are known to elevate hsTnI results.  Refer to the "Links" section for chest pain algorithms and additional  guidance. Performed at [facility], [place] [address] [place], [place] [address]    CT ABDOMEN PELVIS W CONTRAST  Result Date: 11/01/2022 CLINICAL DATA:  81 year old female with body aches, left chest pain, acute abdominal pain. Known hiatal hernia. EXAM: CT ABDOMEN AND PELVIS WITH CONTRAST TECHNIQUE: Multidetector CT imaging of the abdomen and pelvis was performed using the standard protocol following bolus administration of intravenous contrast. RADIATION DOSE REDUCTION: This exam was performed according to the departmental dose-optimization program which includes automated exposure control, adjustment of the mA and/or kV according to patient size and/or use of iterative reconstruction technique. CONTRAST:  57m OMNIPAQUE IOHEXOL 350 MG/ML SOLN COMPARISON:  High-resolution chest CT 11/09/2021. FINDINGS: Lower chest: Broad-based moderate-sized hiatal hernia containing a portion of the stomach and mesentery demonstrated last December. Substantially enlarged hernia sac in the left lower chest since that time, only partly image on this exam. Intrathoracic stomach, splenic flexure and mesentery now. Mild mass effect on the heart. Chronic mass effect on the descending thoracic aorta. No pericardial effusion. There is confluent left lung base atelectasis now. Only minor right lung base atelectasis. No inflammatory stranding in the visible hernia. Hernia neck is 4-5 cm on coronal image 71. Elsewhere the left hemidiaphragm appears thin but intact. There is a trace left pleural effusion in the costophrenic angle. Hepatobiliary: Small circumscribed simple fluid density hepatic cysts were visible last year (no follow-up imaging recommended). Negative gallbladder. Pancreas: Partially atrophied. Spleen: Spleen remains within the abdominal cavity, within normal limits. Adrenals/Urinary Tract: Normal adrenal  glands. Chronic renal parapelvic cysts on the left (no follow-up imaging recommended). Symmetric renal enhancement and contrast excretion on the delayed images. No nephrolithiasis or pararenal inflammation. Ureters appear decompressed. Diminutive bladder. Occasional pelvic phleboliths. Stomach/Bowel: Intrathoracic stomach and large left upper quadrant hernia into the left lower chest as above. Severe diverticulosis of the large bowel from the splenic flexure distally. Fluid in the large bowel to the proximal sigmoid colon. Some retained stool distally. Not all of the herniated splenic flexure is visible. But no bowel wall thickening or mesenteric inflammation is identified. Normal appendix on coronal image 59. Decompressed terminal ileum. No dilated  small bowel. No free air or free fluid identified. Duodenum is largely decompressed. Vascular/Lymphatic: Aortoiliac calcified atherosclerosis. Major arterial structures and portal venous system appear patent. No abdominal aortic aneurysm. No lymphadenopathy identified. Reproductive: Multiple small uterine fibroids, some calcified. Left ovarian simple cyst measuring 3 cm along the pelvic side wall. No follow-up imaging is recommended. Reference: JACR 2020 Feb;17(2):248-254 Otherwise negative. Other: No pelvic free fluid. Musculoskeletal: S shaped thoracolumbar scoliosis with moderate levoconvex lumbar component. Widespread advanced disc degeneration and vacuum disc. No acute osseous abnormality identified. IMPRESSION: 1. Substantially progressed and now large left diaphragmatic hernia. Completely intrathoracic stomach and splenic flexure which are incompletely visible on this exam. Chest CT would be necessary to fully image, but there is no evidence of bowel obstruction, and no definite inflammation in the visible hernia sac. 2. Fluid throughout the large bowel to the sigmoid colon suggesting Enteritis/Diarrhea. Extensive large bowel diverticulosis, but no discrete bowel  inflammation identified. Normal appendix. 3. Left lung base atelectasis, small pleural effusion in association with #1. Electronically Signed   By: Genevie Ann M.D.   On: 11/01/2022 06:14   DG Chest 2 View  Result Date: 11/01/2022 CLINICAL DATA:  Chest pain. EXAM: CHEST - 2 VIEW COMPARISON:  Chest CT dated 11/09/2021. FINDINGS: There is a large hiatal hernia containing a portion of the stomach and transverse colon. The herniated stomach is distended with air-fluid level. Overall significant interval increase in the size of the hiatal hernia compared to the prior CT. A gastric outlet obstruction is not excluded further evaluation with CT of the abdomen pelvis recommended. There is associated compressive atelectasis of the left lung base. The right lung is clear. There is no pleural effusion or pneumothorax. The cardiac silhouette is within normal limits. No acute osseous pathology. IMPRESSION: Large hiatal hernia containing distended stomach. A gastric outlet obstruction is not excluded. Further evaluation with CT is recommended. No other acute cardiopulmonary process. Electronically Signed   By: Anner Crete M.D.   On: 11/01/2022 00:51    Pending Labs Unresulted Labs (From admission, onward)     Start     Ordered   11/08/22 0500  Creatinine, serum  (enoxaparin (LOVENOX)    CrCl >/= 30 ml/min)  Weekly,   R     Comments: while on enoxaparin therapy    11/01/22 1331            Vitals/Pain Today's Vitals   11/01/22 1815 11/01/22 1830 11/01/22 1839 11/01/22 1840  BP:      Pulse: 80 88    Resp: 13 17    Temp:   97.7 F (36.5 C)   TempSrc:   Oral   SpO2: 98% 98%    Weight:      Height:      PainSc:    4     Isolation Precautions No active isolations  Medications Medications  0.45 % sodium chloride infusion ( Intravenous New Bag/Given 11/01/22 1356)  hydrALAZINE (APRESOLINE) injection 10 mg (has no administration in time range)  pantoprazole (PROTONIX) injection 40 mg (has no  administration in time range)  simethicone (MYLICON) chewable tablet 40 mg (40 mg Oral Given 11/01/22 1354)  ondansetron (ZOFRAN-ODT) disintegrating tablet 4 mg ( Oral See Alternative 11/01/22 1417)    Or  ondansetron (ZOFRAN) injection 4 mg (4 mg Intravenous Given 11/01/22 1417)  prochlorperazine (COMPAZINE) tablet 10 mg (has no administration in time range)    Or  prochlorperazine (COMPAZINE) injection 5-10 mg (has no administration in time range)  polyethylene glycol (MIRALAX / GLYCOLAX) packet 17 g (has no administration in time range)  diphenhydrAMINE (BENADRYL) 12.5 MG/5ML elixir 12.5 mg (has no administration in time range)    Or  diphenhydrAMINE (BENADRYL) injection 12.5 mg (has no administration in time range)  methocarbamol (ROBAXIN) tablet 500 mg (has no administration in time range)    Or  methocarbamol (ROBAXIN) 500 mg in dextrose 5 % 50 mL IVPB (has no administration in time range)  morphine (PF) 2 MG/ML injection 2 mg (has no administration in time range)  oxyCODONE (Oxy IR/ROXICODONE) immediate release tablet 5 mg (has no administration in time range)  acetaminophen (TYLENOL) tablet 650 mg (650 mg Oral Given 11/01/22 1755)    Or  acetaminophen (TYLENOL) suppository 650 mg ( Rectal See Alternative 11/01/22 1755)  enoxaparin (LOVENOX) injection 40 mg (has no administration in time range)  pantoprazole (PROTONIX) injection 40 mg (40 mg Intravenous Given 11/01/22 0519)  iohexol (OMNIPAQUE) 350 MG/ML injection 75 mL (75 mLs Intravenous Contrast Given 11/01/22 0544)    Mobility walks Low fall risk   Focused Assessments Cardiac Assessment Handoff:  Cardiac Rhythm: Normal sinus rhythm No results found for: "CKTOTAL", "CKMB", "CKMBINDEX", "TROPONINI" No results found for: "DDIMER" Does the Patient currently have chest pain? No   , Pulmonary Assessment Handoff:  Lung sounds:   O2 Device: Room Air      R Recommendations: See Admitting Provider Note  Report given to:    Additional Notes:

## 2022-11-01 NOTE — ED Notes (Signed)
Patient ambulated to the bathroom with a steady gait

## 2022-11-01 NOTE — ED Provider Notes (Signed)
[facility] Provider Note   CSN: [id number] Arrival date & time: 10/31/22  2230     History  Chief Complaint  Patient presents with   Chest Pain    Monica White is a 81 y.o. female.  81 year old female presents ER today with chest pain.  Patient states she was eating when she had acute onset of severe retrosternal chest pain, bilateral shoulder pain and diffuse abdominal pain.  She states at this point the fentanyl seem to help quite a bit.  She did feel nauseous during this but could not throw up.  She has history of hiatal hernia repair back in April for shortness of breath.  She had a normal pulmonary and cardiac workup leading up to this.   Chest Pain      Home Medications Prior to Admission medications   Medication Sig Start Date End Date Taking? Authorizing Provider  albuterol (VENTOLIN HFA) 108 (90 Base) MCG/ACT inhaler Inhale 1-2 puffs into the lungs every 6 (six) hours as needed. Patient not taking: Reported on 02/19/2022 10/18/21   Parrett, Fonnie Mu, NP  Ascorbic Acid (VITAMIN C PO) Take 1 tablet by mouth daily.    [provider]  aspirin 325 MG tablet Take 1 tablet (325 mg total) by mouth daily. Patient not taking: Reported on 02/19/2022 09/21/17   Rai, Vernelle Emerald, MD  cholecalciferol (VITAMIN D3) 25 MCG (1000 UNIT) tablet Take 1,000 Units by mouth daily.    [provider]  Cyanocobalamin (B-12) 1000 MCG CAPS Take 1,000 mcg by mouth daily.    [provider]  estropipate (ORTHO-EST 0.625) 0.75 MG tablet Take 1 tablet (0.75 mg total) by mouth daily. Patient taking differently: Take 0.75 mg by mouth at bedtime. 03/07/16   Nunzio Cobbs, MD  ferrous sulfate 325 (65 FE) MG tablet Take 325 mg by mouth daily with breakfast.    [provider]  levothyroxine (SYNTHROID, LEVOTHROID) 50 MCG tablet Take 50 mcg by mouth daily. 02/27/15   [provider]  medroxyPROGESTERone (PROVERA) 2.5  MG tablet Take 1 tablet (2.5 mg total) by mouth daily. 03/04/15   Nunzio Cobbs, MD  Multiple Vitamins-Minerals (MULTIVITAMIN PO) Take 1 tablet by mouth daily.    [provider]  Omega-3 Fatty Acids (FISH OIL PO) Take 1 capsule by mouth daily.    [provider]  ondansetron (ZOFRAN) 4 MG tablet Take 1 tablet (4 mg total) by mouth every 8 (eight) hours as needed for nausea or vomiting. 03/13/22   Clovis Riley, MD  oxyCODONE (ROXICODONE) 5 MG immediate release tablet Take 1 tablet (5 mg total) by mouth every 8 (eight) hours as needed. Alternate tylenol and ibuprofen for the first few days. Take narcotic pain medication only if needed for severe/ breakthrough pain. 03/13/22 03/13/23  Clovis Riley, MD  pantoprazole (PROTONIX) 40 MG tablet Take 40 mg by mouth 2 (two) times daily. 01/11/22   [provider]  zolpidem (AMBIEN) 5 MG tablet Take 5 mg by mouth at bedtime as needed for sleep. 09/05/20   [provider]      Allergies    Atorvastatin, Escitalopram, Pitavastatin, and Tramadol    Review of Systems   Review of Systems  Cardiovascular:  Positive for chest pain.    Physical Exam Updated Vital Signs BP 132/77   Pulse 77   Temp 98 F (36.7 C)   Resp (!) 22   Ht '5\' 7"'$  (1.702  m)   Wt 62.6 kg   LMP 01/13/1994   SpO2 99%   BMI 21.61 kg/m  Physical Exam Vitals and nursing note reviewed.  Constitutional:      Appearance: She is well-developed.  HENT:     Head: Normocephalic and atraumatic.  Cardiovascular:     Rate and Rhythm: Normal rate and regular rhythm.  Pulmonary:     Effort: No respiratory distress.     Breath sounds: No stridor.  Abdominal:     General: There is no distension.  Musculoskeletal:        General: Normal range of motion.     Cervical back: Normal range of motion.     Right lower leg: No edema.     Left lower leg: No edema.  Skin:    General: Skin is warm and dry.  Neurological:     General: No focal  deficit present.     Mental Status: She is alert.     ED Results / Procedures / Treatments   Labs (all labs ordered are listed, but only abnormal results are displayed) Labs Reviewed  BASIC METABOLIC PANEL - Abnormal; Notable for the following components:      Result Value   Glucose, Bld 166 (*)    Calcium 8.5 (*)    All other components within normal limits  CBC - Abnormal; Notable for the following components:   Platelets 454 (*)    All other components within normal limits  RESP PANEL BY RT-PCR (FLU A&B, COVID) [id number]  TROPONIN I (HIGH SENSITIVITY)  TROPONIN I (HIGH SENSITIVITY)    EKG None  Radiology DG Chest 2 View  Result Date: 11/01/2022 CLINICAL DATA:  Chest pain. EXAM: CHEST - 2 VIEW COMPARISON:  Chest CT dated 11/09/2021. FINDINGS: There is a large hiatal hernia containing a portion of the stomach and transverse colon. The herniated stomach is distended with air-fluid level. Overall significant interval increase in the size of the hiatal hernia compared to the prior CT. A gastric outlet obstruction is not excluded further evaluation with CT of the abdomen pelvis recommended. There is associated compressive atelectasis of the left lung base. The right lung is clear. There is no pleural effusion or pneumothorax. The cardiac silhouette is within normal limits. No acute osseous pathology. IMPRESSION: Large hiatal hernia containing distended stomach. A gastric outlet obstruction is not excluded. Further evaluation with CT is recommended. No other acute cardiopulmonary process. Electronically Signed   By: Anner Crete M.D.   On: 11/01/2022 00:51    Procedures Procedures   Medications Ordered in ED Medications - No data to display  ED Course/ Medical Decision Making/ A&P                           Medical Decision Making Risk Prescription drug management. Decision regarding hospitalization.  Interpreted chest x-ray on my own is having a large hiatal hernia.   Radiology read is concern for possible gastric outlet obstruction and recommended CT scan.  CT scan will be ordered.  Patient's symptoms are better controlled now.  She is vitally stable.  Will disposition based on CT scan results. Ct with significant hiatal hernia, surgery called, will see for disposition.   Final Clinical Impression(s) / ED Diagnoses Final diagnoses:  None    Rx / DC Orders ED Discharge Orders     None         Ege Muckey, Corene Cornea, MD 11/01/22 [phone]

## 2022-11-01 NOTE — ED Notes (Signed)
Pt ambulatory to restroom

## 2022-11-01 NOTE — ED Notes (Signed)
Pt given water. Pt reports her pain is coming back.

## 2022-11-01 NOTE — ED Notes (Signed)
Attempted to call 2W to initiate purple man process. No answer x1.

## 2022-11-02 ENCOUNTER — Observation Stay (HOSPITAL_COMMUNITY): Payer: Medicare Other

## 2022-11-02 DIAGNOSIS — Z888 Allergy status to other drugs, medicaments and biological substances status: Secondary | ICD-10-CM | POA: Diagnosis not present

## 2022-11-02 DIAGNOSIS — K440 Diaphragmatic hernia with obstruction, without gangrene: Secondary | ICD-10-CM | POA: Diagnosis present

## 2022-11-02 DIAGNOSIS — Z8249 Family history of ischemic heart disease and other diseases of the circulatory system: Secondary | ICD-10-CM | POA: Diagnosis not present

## 2022-11-02 DIAGNOSIS — E039 Hypothyroidism, unspecified: Secondary | ICD-10-CM | POA: Diagnosis present

## 2022-11-02 DIAGNOSIS — Z79890 Hormone replacement therapy (postmenopausal): Secondary | ICD-10-CM | POA: Diagnosis not present

## 2022-11-02 DIAGNOSIS — K219 Gastro-esophageal reflux disease without esophagitis: Secondary | ICD-10-CM | POA: Diagnosis present

## 2022-11-02 DIAGNOSIS — K224 Dyskinesia of esophagus: Secondary | ICD-10-CM | POA: Diagnosis not present

## 2022-11-02 DIAGNOSIS — K449 Diaphragmatic hernia without obstruction or gangrene: Secondary | ICD-10-CM | POA: Diagnosis present

## 2022-11-02 DIAGNOSIS — M1990 Unspecified osteoarthritis, unspecified site: Secondary | ICD-10-CM | POA: Diagnosis not present

## 2022-11-02 DIAGNOSIS — Z1152 Encounter for screening for COVID-19: Secondary | ICD-10-CM | POA: Diagnosis not present

## 2022-11-02 DIAGNOSIS — Z79899 Other long term (current) drug therapy: Secondary | ICD-10-CM | POA: Diagnosis not present

## 2022-11-02 DIAGNOSIS — E7800 Pure hypercholesterolemia, unspecified: Secondary | ICD-10-CM | POA: Diagnosis present

## 2022-11-02 DIAGNOSIS — Z885 Allergy status to narcotic agent status: Secondary | ICD-10-CM | POA: Diagnosis not present

## 2022-11-02 MED ORDER — SALINE SPRAY 0.65 % NA SOLN
1.0000 | NASAL | Status: DC | PRN
  Administered 2022-11-02: 1 via NASAL
  Filled 2022-11-02: qty 44

## 2022-11-02 MED ORDER — ONDANSETRON 4 MG PO TBDP: 4.0000 mg | ORAL_TABLET | Freq: Four times a day (QID) | ORAL | 0 refills | Status: DC | PRN

## 2022-11-02 MED ORDER — ESOMEPRAZOLE MAGNESIUM 20 MG PO PACK: 20.0000 mg | PACK | Freq: Every day | ORAL | 3 refills | Status: DC

## 2022-11-02 NOTE — Discharge Summary (Addendum)
Physician Discharge Summary  Patient ID: Monica White MRN: [id number] DOB/AGE: 1941/10/10 81 y.o.  Admit date: 10/31/2022 Discharge date: 11/09/2022  Admission Diagnoses:  Discharge Diagnoses:  Principal Problem:   Diaphragmatic hernia Active Problems:   Hiatal hernia   Discharged Condition: good  Hospital Course: 81 yo female with recurrent hiatal hernia presented with severe abdominal pain and back pain. She was found to have a large recurrent hiatal hernia. She was tried on liquids with initiially pain. She was about to be discharged home to plan for outpatient surgery followed by admission but due to continued pain, she stayed inpatient. She underwent hiatal hernia reduction and g tube on 12/12. She was slowly advanced to full liquid diet. She initially had moderate pain which improved. She was tolerating diet well and discharged home POD 3  Consults: general surgery  Significant Diagnostic Studies: CT scan, UGI  Treatments: IV fluids and liquid diet  Discharge Exam: Blood pressure 133/68, pulse 60, temperature 98.1 F (36.7 C), temperature source Oral, resp. rate 14, height '5\' 7"'$  (1.702 m), weight 62 kg, last menstrual period 01/13/1994, SpO2 92 %. General appearance: alert, cooperative, and appears stated age GI: soft, non-tender; bowel sounds normal; no masses,  no organomegaly  Disposition: Discharge disposition: 01-Home or Self Care       Discharge Instructions     Call MD for:  persistant nausea and vomiting   Complete by: As directed    Call MD for:  severe uncontrolled pain   Complete by: As directed    Call MD for:  temperature >100.4   Complete by: As directed    Diet full liquid   Complete by: As directed    Discharge wound care:   Complete by: As directed    Bandage around drain after showers, can vent drain for gas/bloating symptoms, can use drain for water or protein shakes for dehydration or poor appetite   Increase activity slowly   Complete by:  As directed    Increase activity slowly   Complete by: As directed       Allergies as of 11/09/2022       Reactions   Atorvastatin Other (See Comments)   Stiffness   Escitalopram Other (See Comments)   Stiffness   Pitavastatin Other (See Comments)   Stiffness   Tramadol Other (See Comments)   "Felt bad and headache"        Medication List     STOP taking these medications    estropipate 0.75 MG tablet Commonly known as: Ortho-Est 0.625   OVER THE COUNTER MEDICATION       TAKE these medications    B-12 1000 MCG Caps Take 1,000 mcg by mouth daily.   cholecalciferol 25 MCG (1000 UNIT) tablet Commonly known as: VITAMIN D3 Take 1,000 Units by mouth daily.   esomeprazole 20 MG packet Commonly known as: NexIUM Take 20 mg by mouth daily before breakfast.   estradiol 0.5 MG tablet Commonly known as: ESTRACE Take 0.5 mg by mouth daily.   levothyroxine 50 MCG tablet Commonly known as: SYNTHROID Take 50 mcg by mouth daily.   medroxyPROGESTERone 2.5 MG tablet Commonly known as: PROVERA Take 1 tablet (2.5 mg total) by mouth daily.   MULTIVITAMIN PO Take 1 tablet by mouth daily.   ondansetron 4 MG disintegrating tablet Commonly known as: ZOFRAN-ODT Take 1 tablet (4 mg total) by mouth every 6 (six) hours as needed for nausea.   PROBIOTIC PO Take 1 capsule by mouth daily. Electronics engineer  VITAMIN C PO Take 1 tablet by mouth daily.               Discharge Care Instructions  (From admission, onward)           Start     Ordered   11/09/22 0000  Discharge wound care:       Comments: Bandage around drain after showers, can vent drain for gas/bloating symptoms, can use drain for water or protein shakes for dehydration or poor appetite   11/09/22 0955            Follow-up Information     Sakina Briones, Arta Bruce, MD Follow up on 11/29/2022.   Specialty: General Surgery Contact information: [address] [place]  [address] [phone]                 Signed: Arta Bruce Aubery Douthat 11/09/2022, 9:57 AM

## 2022-11-02 NOTE — Plan of Care (Signed)

## 2022-11-02 NOTE — ED Provider Notes (Signed)
81 yo F with a chief complaints of epigastric pain after eating.  Patient recent surgical procedure.  Awaiting surgery evaluation.  General surgery to admit.   Deno Etienne, DO 11/02/22 [phone]

## 2022-11-03 MED ORDER — LOPERAMIDE HCL 1 MG/7.5ML PO SUSP
2.0000 mg | Freq: Four times a day (QID) | ORAL | Status: DC | PRN
  Administered 2022-11-03 – 2022-11-04 (×5): 2 mg via ORAL
  Filled 2022-11-03 (×6): qty 15

## 2022-11-03 NOTE — Progress Notes (Signed)
      Chief Complaint/Subjective: Patient initially appearing well and having tolerated liquids well lastnight. After trying more food this morning the pain and nausea returned. She and family are nervous about going home  Objective: Vital signs in last 24 hours: Temp:  [97.9 F (36.6 C)-98.4 F (36.9 C)] 98.4 F (36.9 C) (12/09 0517) Pulse Rate:  [65-77] 72 (12/09 0517) Resp:  [15-18] 17 (12/09 0517) BP: (110-120)/(57-81) 111/57 (12/09 0517) SpO2:  [95 %-98 %] 95 % (12/09 0517) Last BM Date : 11/01/22 Intake/Output from previous day: No intake/output data recorded.  PE: Gen: NAD Resp: nonlabored Card: RRR Abd: soft, NT  Lab Results:  Recent Labs    10/31/22 2241  WBC 8.8  HGB 14.3  HCT 42.5  PLT 454*   Recent Labs    10/31/22 2241  NA 141  K 3.8  CL 107  CO2 22  GLUCOSE 166*  BUN 22  CREATININE 0.75  CALCIUM 8.5*   No results for input(s): "LABPROT", "INR" in the last 72 hours.    Component Value Date/Time   NA 141 10/31/2022 2241   NA 142 11/29/2021 1048   K 3.8 10/31/2022 2241   CL 107 10/31/2022 2241   CO2 22 10/31/2022 2241   GLUCOSE 166 (H) 10/31/2022 2241   BUN 22 10/31/2022 2241   BUN 21 11/29/2021 1048   CREATININE 0.75 10/31/2022 2241   CALCIUM 8.5 (L) 10/31/2022 2241   PROT 6.3 (L) 09/19/2017 1820   ALBUMIN 4.0 09/19/2017 1820   AST 19 01/06/2021 0939   ALT 19 09/19/2017 1820   ALKPHOS 72 09/19/2017 1820   BILITOT 0.4 09/19/2017 1820   GFRNONAA >60 10/31/2022 2241   GFRAA >60 02/17/2018 1123    Anti-infectives: Anti-infectives (From admission, onward)    None       Assessment/Plan Recurrent hiatal hernia FEN - NPO until after procedure VTE - lovenox ID - no issues Disposition - inpatient, UGI, plan for surgery next week   LOS: 1 day   I reviewed last 24 h vitals and pain scores, last 48 h intake and output, last 24 h labs and trends, and last 24 h imaging results.  This care required high  level of medical decision  making.   [place] [facility] at [facility] 11/03/2022, 6:54 AM Please see Amion for pager number during day hours 7:00am-4:30pm or 7:00am -11:30am on weekends

## 2022-11-03 NOTE — Progress Notes (Signed)
Subjective: CC: Had some epigastric/lower chest pain with liquids before discharge yesterday.  Discharge canceled.  Upper GI obtained.  Results reviewed.  Barium tablet did not pass by the narrowed appearance of distal esophagus.  Contrast did pass into the stomach and proximal small bowel.  She has known hiatal hernia.  Denies any pain this morning.  No nausea or vomiting.  Had diarrhea overnight.  Objective: Vital signs in last 24 hours: Temp:  [97.9 F (36.6 C)-98.4 F (36.9 C)] 98.1 F (36.7 C) (12/09 0819) Pulse Rate:  [65-77] 66 (12/09 0819) Resp:  [15-18] 16 (12/09 0819) BP: (107-120)/(57-81) 107/76 (12/09 0819) SpO2:  [95 %-98 %] 98 % (12/09 0819) Last BM Date : 11/01/22  Intake/Output from previous day: No intake/output data recorded. Intake/Output this shift: No intake/output data recorded.  PE: Gen:  Alert, NAD, pleasant Card:  Reg Pulm:  CTA b/l Abd: Soft, ND, NT, +BS Ext:  No LE edema  Psych: A&Ox3   Lab Results:  Recent Labs    10/31/22 2241  WBC 8.8  HGB 14.3  HCT 42.5  PLT 454*   BMET Recent Labs    10/31/22 2241  NA 141  K 3.8  CL 107  CO2 22  GLUCOSE 166*  BUN 22  CREATININE 0.75  CALCIUM 8.5*   PT/INR No results for input(s): "LABPROT", "INR" in the last 72 hours. CMP     Component Value Date/Time   NA 141 10/31/2022 2241   NA 142 11/29/2021 1048   K 3.8 10/31/2022 2241   CL 107 10/31/2022 2241   CO2 22 10/31/2022 2241   GLUCOSE 166 (H) 10/31/2022 2241   BUN 22 10/31/2022 2241   BUN 21 11/29/2021 1048   CREATININE 0.75 10/31/2022 2241   CALCIUM 8.5 (L) 10/31/2022 2241   PROT 6.3 (L) 09/19/2017 1820   ALBUMIN 4.0 09/19/2017 1820   AST 19 01/06/2021 0939   ALT 19 09/19/2017 1820   ALKPHOS 72 09/19/2017 1820   BILITOT 0.4 09/19/2017 1820   GFRNONAA >60 10/31/2022 2241   GFRAA >60 02/17/2018 1123   Lipase  No results found for: "LIPASE"  Studies/Results: DG UGI W SINGLE CM (SOL OR THIN BA)  Result Date:  11/02/2022 CLINICAL DATA:  Provided history: Hiatal hernia. Additional history: Prior laparoscopic paraesophageal hernia repair with laparoscopic Toupet fundoplication. Patient reports abdominal pain when eating or drinking. Recent CT demonstrating recurrent hiatal hernia. EXAM: DG UGI W SINGLE CM TECHNIQUE: A single contrast upper GI series was performed using water soluble contrast. The exam was performed by Candiss Norse, PA-C, and was supervised and interpreted by Dr. Kellie Simmering. FLUOROSCOPY: Fluoroscopy time: 3 minutes, 42 seconds (34.20 mGy). COMPARISON:  CT abdomen/pelvis 11/01/2022. FINDINGS: Narrowed appearance of the distal esophagus. A swallowed 13 mm barium tablet did not pass beyond this point despite a prolonged period of observation. Esophagus normal in caliber and smooth in contour elsewhere. Severe esophageal dysmotility with tertiary contractions. Large hiatal hernia with the majority of the stomach located within the chest. However, there was prompt passage of contrast from the stomach into the proximal small bowel. No definite mucosal abnormality identified within the stomach or duodenum within the limitations of a single contrast/water-soluble contrast examination. No gastroesophageal reflux was observed. Cervical, thoracic and lumbar spondylosis. S-shaped curvature of the thoracolumbar spine. IMPRESSION: Narrowed appearance of the distal esophagus. A swallowed 13 mm barium tablet did not pass beyond this point despite a prolonged period of observation. These findings suggest the  presence of a distal esophageal stricture, and endoscopy should be considered for further evaluation. Severe esophageal dysmotility with tertiary contractions. As demonstrated on the prior CT abdomen/pelvis of 11/01/2022, there is a large hiatal hernia with the majority of the stomach located within the chest. However, there was prompt passage of contrast from the stomach into the proximal small bowel.  Electronically Signed   By: Kellie Simmering D.O.   On: 11/02/2022 17:21    Anti-infectives: Anti-infectives (From admission, onward)    None        Assessment/Plan Recurrent hiatal hernia. Hx laparoscopic paraesophageal hernia repair, laparoscopic Toupet fundoplication by Dr. Kieth Brightly 03/12/22 - UGI results noted. Barium tablet did not pass but liquid contrast did. Will allow cld. Dr. Kieth Brightly planning for surgery on Tuesday.   FEN - CLD VTE - SCDs, Lovenox ID - None   LOS: 1 day    [name] , [facility] 11/03/2022, 8:31 AM Please see Amion for pager number during day hours 7:00am-4:30pm

## 2022-11-04 MED ORDER — ACETAMINOPHEN 160 MG/5ML PO SOLN
650.0000 mg | Freq: Four times a day (QID) | ORAL | Status: DC | PRN
  Administered 2022-11-05 (×2): 650 mg via ORAL
  Filled 2022-11-04 (×2): qty 20.3

## 2022-11-04 MED ORDER — OXYCODONE HCL 5 MG/5ML PO SOLN
5.0000 mg | ORAL | Status: DC | PRN
  Administered 2022-11-04: 5 mg via ORAL
  Filled 2022-11-04: qty 5

## 2022-11-04 NOTE — Progress Notes (Signed)
Subjective: CC: Had some pain and nausea after po intake with cld yesterday. No abdominal pain or nausea this am. She is going to try and take cld slow today. Diarrhea resolved. Voiding without issues.   Objective: Vital signs in last 24 hours: Temp:  [98.2 F (36.8 C)-98.7 F (37.1 C)] 98.3 F (36.8 C) (12/10 0429) Pulse Rate:  [62-65] 65 (12/10 0429) Resp:  [17-18] 17 (12/10 0429) BP: (102-125)/(63-78) 102/63 (12/10 0429) SpO2:  [93 %-97 %] 93 % (12/10 0429) Last BM Date : 11/01/22  Intake/Output from previous day: No intake/output data recorded. Intake/Output this shift: No intake/output data recorded.  PE: Gen:  Alert, NAD, pleasant Card:  Reg Pulm:  CTA b/l Abd: Soft, ND, NT, +BS Ext:  No LE edema  Psych: A&Ox3   Lab Results:  No results for input(s): "WBC", "HGB", "HCT", "PLT" in the last 72 hours.  BMET No results for input(s): "NA", "K", "CL", "CO2", "GLUCOSE", "BUN", "CREATININE", "CALCIUM" in the last 72 hours.  PT/INR No results for input(s): "LABPROT", "INR" in the last 72 hours. CMP     Component Value Date/Time   NA 141 10/31/2022 2241   NA 142 11/29/2021 1048   K 3.8 10/31/2022 2241   CL 107 10/31/2022 2241   CO2 22 10/31/2022 2241   GLUCOSE 166 (H) 10/31/2022 2241   BUN 22 10/31/2022 2241   BUN 21 11/29/2021 1048   CREATININE 0.75 10/31/2022 2241   CALCIUM 8.5 (L) 10/31/2022 2241   PROT 6.3 (L) 09/19/2017 1820   ALBUMIN 4.0 09/19/2017 1820   AST 19 01/06/2021 0939   ALT 19 09/19/2017 1820   ALKPHOS 72 09/19/2017 1820   BILITOT 0.4 09/19/2017 1820   GFRNONAA >60 10/31/2022 2241   GFRAA >60 02/17/2018 1123   Lipase  No results found for: "LIPASE"  Studies/Results: DG UGI W SINGLE CM (SOL OR THIN BA)  Result Date: 11/02/2022 CLINICAL DATA:  Provided history: Hiatal hernia. Additional history: Prior laparoscopic paraesophageal hernia repair with laparoscopic Toupet fundoplication. Patient reports abdominal pain when eating or  drinking. Recent CT demonstrating recurrent hiatal hernia. EXAM: DG UGI W SINGLE CM TECHNIQUE: A single contrast upper GI series was performed using water soluble contrast. The exam was performed by Candiss Norse, PA-C, and was supervised and interpreted by Dr. Kellie Simmering. FLUOROSCOPY: Fluoroscopy time: 3 minutes, 42 seconds (34.20 mGy). COMPARISON:  CT abdomen/pelvis 11/01/2022. FINDINGS: Narrowed appearance of the distal esophagus. A swallowed 13 mm barium tablet did not pass beyond this point despite a prolonged period of observation. Esophagus normal in caliber and smooth in contour elsewhere. Severe esophageal dysmotility with tertiary contractions. Large hiatal hernia with the majority of the stomach located within the chest. However, there was prompt passage of contrast from the stomach into the proximal small bowel. No definite mucosal abnormality identified within the stomach or duodenum within the limitations of a single contrast/water-soluble contrast examination. No gastroesophageal reflux was observed. Cervical, thoracic and lumbar spondylosis. S-shaped curvature of the thoracolumbar spine. IMPRESSION: Narrowed appearance of the distal esophagus. A swallowed 13 mm barium tablet did not pass beyond this point despite a prolonged period of observation. These findings suggest the presence of a distal esophageal stricture, and endoscopy should be considered for further evaluation. Severe esophageal dysmotility with tertiary contractions. As demonstrated on the prior CT abdomen/pelvis of 11/01/2022, there is a large hiatal hernia with the majority of the stomach located within the chest. However, there was prompt passage of contrast from  the stomach into the proximal small bowel. Electronically Signed   By: Kellie Simmering D.O.   On: 11/02/2022 17:21    Anti-infectives: Anti-infectives (From admission, onward)    None        Assessment/Plan Recurrent hiatal hernia. Hx laparoscopic  paraesophageal hernia repair, laparoscopic Toupet fundoplication by Dr. Kieth Brightly 03/12/22 - UGI results noted. Barium tablet did not pass but liquid contrast did. Discussed with attendings. Will allow cld. Change meds to solution. Dr. Kieth Brightly planning for surgery on Tuesday.   FEN - CLD VTE - SCDs, Lovenox ID - None   LOS: 2 days    [name] , [facility] 11/04/2022, 8:23 AM Please see Amion for pager number during day hours 7:00am-4:30pm

## 2022-11-05 ENCOUNTER — Encounter (HOSPITAL_COMMUNITY): Payer: Self-pay

## 2022-11-05 MED ORDER — MELATONIN 3 MG PO TABS
3.0000 mg | ORAL_TABLET | Freq: Every day | ORAL | Status: DC
  Administered 2022-11-05: 3 mg via ORAL
  Filled 2022-11-05: qty 1

## 2022-11-05 NOTE — Care Management Important Message (Signed)
Important Message  Patient Details  Name: Monica White MRN: [id number] Date of Birth: 07/11/1941   Medicare Important Message Given:  Yes     Terrionna Bridwell [address] 11/05/2022, 3:27 PM

## 2022-11-05 NOTE — Progress Notes (Signed)
      Chief Complaint/Subjective: Tolerating liquids  Objective: Vital signs in last 24 hours: Temp:  [98.2 F (36.8 C)] 98.2 F (36.8 C) (12/11 0808) Pulse Rate:  [65-68] 67 (12/11 0808) Resp:  [15-18] 15 (12/11 0808) BP: (113-122)/(60-79) 122/65 (12/11 0808) SpO2:  [93 %-95 %] 94 % (12/11 0808) Last BM Date : 11/04/22 Intake/Output from previous day: 12/10 0701 - 12/11 0700 In: 4188.9 [I.V.:4188.9] Out: -   PE: Gen: NAD Resp: nonlabored Card: RRR Abd: soft, NT  Lab Results:  No results for input(s): "WBC", "HGB", "HCT", "PLT" in the last 72 hours. No results for input(s): "NA", "K", "CL", "CO2", "GLUCOSE", "BUN", "CREATININE", "CALCIUM" in the last 72 hours. No results for input(s): "LABPROT", "INR" in the last 72 hours.    Component Value Date/Time   NA 141 10/31/2022 2241   NA 142 11/29/2021 1048   K 3.8 10/31/2022 2241   CL 107 10/31/2022 2241   CO2 22 10/31/2022 2241   GLUCOSE 166 (H) 10/31/2022 2241   BUN 22 10/31/2022 2241   BUN 21 11/29/2021 1048   CREATININE 0.75 10/31/2022 2241   CALCIUM 8.5 (L) 10/31/2022 2241   PROT 6.3 (L) 09/19/2017 1820   ALBUMIN 4.0 09/19/2017 1820   AST 19 01/06/2021 0939   ALT 19 09/19/2017 1820   ALKPHOS 72 09/19/2017 1820   BILITOT 0.4 09/19/2017 1820   GFRNONAA >60 10/31/2022 2241   GFRAA >60 02/17/2018 1123    Anti-infectives: Anti-infectives (From admission, onward)    None       Assessment/Plan  s/p Procedure(s): XI ROBOTIC ASSISTED HIATAL HERNIA REPAIR WITH MESH INSERTION, G-TUBE INSERTION, POSSIBLE   EGD 11/06/2022   FEN - Clear liquids, NPO 5 am VTE - lovenox ID - no issues Disposition - transfer to [place] for procedure   LOS: 3 days   I reviewed last 24 h vitals and pain scores, last 48 h intake and output, last 24 h labs and trends, and last 24 h imaging results.  This care required high  level of medical decision making.   [place] [facility] at [facility] 11/05/2022, 3:58 PM Please see Amion for pager number during day hours 7:00am-4:30pm or 7:00am -11:30am on weekends

## 2022-11-05 NOTE — Plan of Care (Signed)
  Problem: Education: Goal: Knowledge of General Education information will improve Description Including pain rating scale, medication(s)/side effects and non-pharmacologic comfort measures Outcome: Progressing   

## 2022-11-06 ENCOUNTER — Encounter (HOSPITAL_COMMUNITY)
Admission: EM | Disposition: A | Discharge status: Home / Self Care | Payer: Self-pay | Source: Home / Self Care | Attending: General Surgery

## 2022-11-06 ENCOUNTER — Inpatient Hospital Stay (HOSPITAL_COMMUNITY): Payer: Medicare Other | Admitting: Certified Registered Nurse Anesthetist

## 2022-11-06 ENCOUNTER — Other Ambulatory Visit: Payer: Self-pay

## 2022-11-06 ENCOUNTER — Encounter (HOSPITAL_COMMUNITY): Payer: Self-pay | Admitting: General Surgery

## 2022-11-06 DIAGNOSIS — K440 Diaphragmatic hernia with obstruction, without gangrene: Secondary | ICD-10-CM

## 2022-11-06 DIAGNOSIS — M1990 Unspecified osteoarthritis, unspecified site: Secondary | ICD-10-CM

## 2022-11-06 DIAGNOSIS — E039 Hypothyroidism, unspecified: Secondary | ICD-10-CM

## 2022-11-06 HISTORY — PX: XI ROBOTIC ASSISTED HIATAL HERNIA REPAIR: SHX6889

## 2022-11-06 LAB — CBC
HCT: 40.3 % (ref 36.0–46.0)
Hemoglobin: 12.8 g/dL (ref 12.0–15.0)
MCH: 31.3 pg (ref 26.0–34.0)
MCHC: 31.8 g/dL (ref 30.0–36.0)
MCV: 98.5 fL (ref 80.0–100.0)
Platelets: 352 10*3/uL (ref 150–400)
RBC: 4.09 MIL/uL (ref 3.87–5.11)
RDW: 12.5 % (ref 11.5–15.5)
WBC: 24.3 10*3/uL — ABNORMAL HIGH (ref 4.0–10.5)
nRBC: 0 % (ref 0.0–0.2)

## 2022-11-06 LAB — CREATININE, SERUM
Creatinine, Ser: 0.6 mg/dL (ref 0.44–1.00)
GFR, Estimated: >60 mL/min (ref >60)

## 2022-11-06 SURGERY — REPAIR, HERNIA, HIATAL, ROBOT-ASSISTED
Anesthesia: General | Site: Abdomen

## 2022-11-06 MED ORDER — ACETAMINOPHEN 500 MG PO TABS
1000.0000 mg | ORAL_TABLET | Freq: Four times a day (QID) | ORAL | Status: DC
  Administered 2022-11-06 – 2022-11-09 (×10): 1000 mg via ORAL
  Filled 2022-11-06 (×11): qty 2

## 2022-11-06 MED ORDER — MELATONIN 3 MG PO TABS
3.0000 mg | ORAL_TABLET | Freq: Every evening | ORAL | Status: DC | PRN
  Administered 2022-11-06 – 2022-11-08 (×3): 3 mg via ORAL
  Filled 2022-11-06 (×4): qty 1

## 2022-11-06 MED ORDER — PHENYLEPHRINE 80 MCG/ML (10ML) SYRINGE FOR IV PUSH (FOR BLOOD PRESSURE SUPPORT)
PREFILLED_SYRINGE | INTRAVENOUS | Status: DC | PRN
  Administered 2022-11-06 (×2): 160 ug via INTRAVENOUS

## 2022-11-06 MED ORDER — SIMETHICONE 80 MG PO CHEW
40.0000 mg | CHEWABLE_TABLET | Freq: Four times a day (QID) | ORAL | Status: DC | PRN
  Administered 2022-11-06: 40 mg via ORAL
  Filled 2022-11-06: qty 1

## 2022-11-06 MED ORDER — MEDROXYPROGESTERONE ACETATE 2.5 MG PO TABS
2.5000 mg | ORAL_TABLET | Freq: Every day | ORAL | Status: DC
  Administered 2022-11-07 – 2022-11-08 (×2): 2.5 mg via ORAL
  Filled 2022-11-06 (×4): qty 1

## 2022-11-06 MED ORDER — ONDANSETRON HCL 4 MG/2ML IJ SOLN
INTRAMUSCULAR | Status: AC
  Filled 2022-11-06: qty 2

## 2022-11-06 MED ORDER — ROCURONIUM BROMIDE 10 MG/ML (PF) SYRINGE
PREFILLED_SYRINGE | INTRAVENOUS | Status: AC
  Filled 2022-11-06: qty 10

## 2022-11-06 MED ORDER — LIDOCAINE HCL (CARDIAC) PF 100 MG/5ML IV SOSY
PREFILLED_SYRINGE | INTRAVENOUS | Status: DC | PRN
  Administered 2022-11-06: 60 mg via INTRAVENOUS

## 2022-11-06 MED ORDER — PHENYLEPHRINE 80 MCG/ML (10ML) SYRINGE FOR IV PUSH (FOR BLOOD PRESSURE SUPPORT)
PREFILLED_SYRINGE | INTRAVENOUS | Status: AC
  Filled 2022-11-06: qty 10

## 2022-11-06 MED ORDER — ONDANSETRON HCL 4 MG/2ML IJ SOLN: 4.0000 mg | Freq: Once | INTRAMUSCULAR | Status: DC | PRN

## 2022-11-06 MED ORDER — PANTOPRAZOLE SODIUM 40 MG IV SOLR
40.0000 mg | Freq: Every day | INTRAVENOUS | Status: DC
  Administered 2022-11-06 – 2022-11-08 (×3): 40 mg via INTRAVENOUS
  Filled 2022-11-06 (×3): qty 10

## 2022-11-06 MED ORDER — DEXTROSE-NACL 5-0.45 % IV SOLN: INTRAVENOUS | Status: DC

## 2022-11-06 MED ORDER — FENTANYL CITRATE (PF) 100 MCG/2ML IJ SOLN
INTRAMUSCULAR | Status: DC | PRN
  Administered 2022-11-06: 75 ug via INTRAVENOUS
  Administered 2022-11-06: 25 ug via INTRAVENOUS

## 2022-11-06 MED ORDER — ACETAMINOPHEN 10 MG/ML IV SOLN
INTRAVENOUS | Status: DC | PRN
  Administered 2022-11-06: 1000 mg via INTRAVENOUS

## 2022-11-06 MED ORDER — ROCURONIUM BROMIDE 100 MG/10ML IV SOLN
INTRAVENOUS | Status: DC | PRN
  Administered 2022-11-06: 20 mg via INTRAVENOUS
  Administered 2022-11-06: 50 mg via INTRAVENOUS

## 2022-11-06 MED ORDER — LACTATED RINGERS IV SOLN: INTRAVENOUS | Status: DC

## 2022-11-06 MED ORDER — PHENYLEPHRINE HCL (PRESSORS) 10 MG/ML IV SOLN
INTRAVENOUS | Status: AC
  Filled 2022-11-06: qty 1

## 2022-11-06 MED ORDER — LACTATED RINGERS IR SOLN
Status: DC | PRN
  Administered 2022-11-06: 1000 mL

## 2022-11-06 MED ORDER — DIPHENHYDRAMINE HCL 50 MG/ML IJ SOLN: 12.5000 mg | Freq: Four times a day (QID) | INTRAMUSCULAR | Status: DC | PRN

## 2022-11-06 MED ORDER — BUPIVACAINE HCL (PF) 0.5 % IJ SOLN
INTRAMUSCULAR | Status: AC
  Filled 2022-11-06: qty 30

## 2022-11-06 MED ORDER — PROPOFOL 10 MG/ML IV BOLUS
INTRAVENOUS | Status: AC
  Filled 2022-11-06: qty 20

## 2022-11-06 MED ORDER — PHENYLEPHRINE HCL-NACL 20-0.9 MG/250ML-% IV SOLN
INTRAVENOUS | Status: DC | PRN
  Administered 2022-11-06: 40 ug/min via INTRAVENOUS

## 2022-11-06 MED ORDER — ONDANSETRON 4 MG PO TBDP
4.0000 mg | ORAL_TABLET | Freq: Four times a day (QID) | ORAL | Status: DC | PRN
  Filled 2022-11-06: qty 1

## 2022-11-06 MED ORDER — CHLORHEXIDINE GLUCONATE 0.12 % MT SOLN
15.0000 mL | Freq: Once | OROMUCOSAL | Status: AC
  Administered 2022-11-06: 15 mL via OROMUCOSAL

## 2022-11-06 MED ORDER — HYDROMORPHONE HCL 1 MG/ML IJ SOLN
0.2500 mg | INTRAMUSCULAR | Status: DC | PRN
  Administered 2022-11-06 – 2022-11-07 (×3): 0.25 mg via INTRAVENOUS
  Filled 2022-11-06 (×3): qty 0.5

## 2022-11-06 MED ORDER — FENTANYL CITRATE (PF) 100 MCG/2ML IJ SOLN
INTRAMUSCULAR | Status: AC
  Filled 2022-11-06: qty 2

## 2022-11-06 MED ORDER — NON FORMULARY: 0.7500 mg | Freq: Every day | Status: DC

## 2022-11-06 MED ORDER — SUGAMMADEX SODIUM 200 MG/2ML IV SOLN
INTRAVENOUS | Status: DC | PRN
  Administered 2022-11-06: 150 mg via INTRAVENOUS

## 2022-11-06 MED ORDER — BUPIVACAINE LIPOSOME 1.3 % IJ SUSP
INTRAMUSCULAR | Status: DC | PRN
  Administered 2022-11-06: 20 mL

## 2022-11-06 MED ORDER — BUPIVACAINE-EPINEPHRINE 0.25% -1:200000 IJ SOLN
INTRAMUSCULAR | Status: DC | PRN
  Administered 2022-11-06: 30 mL

## 2022-11-06 MED ORDER — OXYCODONE HCL 5 MG PO TABS
5.0000 mg | ORAL_TABLET | ORAL | Status: DC | PRN
  Administered 2022-11-07 – 2022-11-08 (×5): 5 mg via ORAL
  Filled 2022-11-06 (×7): qty 1

## 2022-11-06 MED ORDER — ONDANSETRON HCL 4 MG/2ML IJ SOLN
INTRAMUSCULAR | Status: DC | PRN
  Administered 2022-11-06: 4 mg via INTRAVENOUS

## 2022-11-06 MED ORDER — ACETAMINOPHEN 10 MG/ML IV SOLN
INTRAVENOUS | Status: AC
  Filled 2022-11-06: qty 100

## 2022-11-06 MED ORDER — ORTHO-EST 0.625 0.75 MG PO TABS: 0.7500 mg | ORAL_TABLET | Freq: Every day | ORAL | Status: DC

## 2022-11-06 MED ORDER — FENTANYL CITRATE PF 50 MCG/ML IJ SOSY
PREFILLED_SYRINGE | INTRAMUSCULAR | Status: AC
  Filled 2022-11-06: qty 1

## 2022-11-06 MED ORDER — LEVOTHYROXINE SODIUM 50 MCG PO TABS
50.0000 ug | ORAL_TABLET | Freq: Every day | ORAL | Status: DC
  Administered 2022-11-07 – 2022-11-09 (×3): 50 ug via ORAL
  Filled 2022-11-06 (×3): qty 1

## 2022-11-06 MED ORDER — DEXAMETHASONE SODIUM PHOSPHATE 10 MG/ML IJ SOLN
INTRAMUSCULAR | Status: DC | PRN
  Administered 2022-11-06: 8 mg via INTRAVENOUS

## 2022-11-06 MED ORDER — BUPIVACAINE LIPOSOME 1.3 % IJ SUSP
INTRAMUSCULAR | Status: AC
  Filled 2022-11-06: qty 20

## 2022-11-06 MED ORDER — PROPOFOL 10 MG/ML IV BOLUS
INTRAVENOUS | Status: DC | PRN
  Administered 2022-11-06: 100 mg via INTRAVENOUS

## 2022-11-06 MED ORDER — FENTANYL CITRATE PF 50 MCG/ML IJ SOSY
25.0000 ug | PREFILLED_SYRINGE | INTRAMUSCULAR | Status: DC | PRN
  Administered 2022-11-06 (×4): 25 ug via INTRAVENOUS
  Administered 2022-11-06: 50 ug via INTRAVENOUS

## 2022-11-06 MED ORDER — ENOXAPARIN SODIUM 40 MG/0.4ML IJ SOSY
40.0000 mg | PREFILLED_SYRINGE | INTRAMUSCULAR | Status: DC
  Administered 2022-11-07 – 2022-11-09 (×3): 40 mg via SUBCUTANEOUS
  Filled 2022-11-06 (×3): qty 0.4

## 2022-11-06 MED ORDER — 0.9 % SODIUM CHLORIDE (POUR BTL) OPTIME
TOPICAL | Status: DC | PRN
  Administered 2022-11-06: 1000 mL

## 2022-11-06 MED ORDER — METOPROLOL TARTRATE 5 MG/5ML IV SOLN: 5.0000 mg | Freq: Four times a day (QID) | INTRAVENOUS | Status: DC | PRN

## 2022-11-06 MED ORDER — ONDANSETRON HCL 4 MG/2ML IJ SOLN
4.0000 mg | Freq: Four times a day (QID) | INTRAMUSCULAR | Status: DC | PRN
  Administered 2022-11-06 – 2022-11-08 (×5): 4 mg via INTRAVENOUS
  Filled 2022-11-06 (×5): qty 2

## 2022-11-06 MED ORDER — NONFORMULARY OR COMPOUNDED ITEM: 0.7500 mg | Freq: Every day | Status: DC

## 2022-11-06 MED ORDER — DIPHENHYDRAMINE HCL 12.5 MG/5ML PO ELIX
12.5000 mg | ORAL_SOLUTION | Freq: Four times a day (QID) | ORAL | Status: DC | PRN
  Administered 2022-11-08 (×2): 12.5 mg via ORAL
  Filled 2022-11-06 (×2): qty 5

## 2022-11-06 MED ORDER — BUPIVACAINE LIPOSOME 1.3 % IJ SUSP
20.0000 mL | Freq: Once | INTRAMUSCULAR | Status: DC
  Filled 2022-11-06: qty 20

## 2022-11-06 MED ORDER — CEFAZOLIN SODIUM-DEXTROSE 2-4 GM/100ML-% IV SOLN
2.0000 g | INTRAVENOUS | Status: AC
  Administered 2022-11-06: 2 g via INTRAVENOUS
  Filled 2022-11-06: qty 100

## 2022-11-06 SURGICAL SUPPLY — 61 items
ADH SKN CLS APL DERMABOND .7 (GAUZE/BANDAGES/DRESSINGS) ×1
ANTIFOG SOL W/FOAM PAD STRL (MISCELLANEOUS) ×1
APL PRP STRL LF DISP 70% ISPRP (MISCELLANEOUS) ×1
APPLIER CLIP 5 13 M/L LIGAMAX5 (MISCELLANEOUS)
APPLIER CLIP ROT 10 11.4 M/L (STAPLE)
APR CLP MED LRG 11.4X10 (STAPLE)
APR CLP MED LRG 5 ANG JAW (MISCELLANEOUS)
BAG COUNTER SPONGE SURGICOUNT (BAG) ×2 IMPLANT
BAG SPNG CNTER NS LX DISP (BAG) ×1
BLADE SURG SZ11 CARB STEEL (BLADE) ×2 IMPLANT
CHLORAPREP W/TINT 26 (MISCELLANEOUS) ×2 IMPLANT
CLIP APPLIE 5 13 M/L LIGAMAX5 (MISCELLANEOUS) IMPLANT
CLIP APPLIE ROT 10 11.4 M/L (STAPLE) IMPLANT
COVER SURGICAL LIGHT HANDLE (MISCELLANEOUS) ×2 IMPLANT
COVER TIP SHEARS 8 DVNC (MISCELLANEOUS) IMPLANT
COVER TIP SHEARS 8MM DA VINCI (MISCELLANEOUS) ×1
DERMABOND ADVANCED .7 DNX12 (GAUZE/BANDAGES/DRESSINGS) ×2 IMPLANT
DRAIN PENROSE 0.5X18 (DRAIN) IMPLANT
DRAPE ARM DVNC X/XI (DISPOSABLE) ×8 IMPLANT
DRAPE COLUMN DVNC XI (DISPOSABLE) ×2 IMPLANT
DRAPE DA VINCI XI ARM (DISPOSABLE) ×4
DRAPE DA VINCI XI COLUMN (DISPOSABLE) ×1
DRAPE ORTHO SPLIT 77X108 STRL (DRAPES)
DRAPE SURG ORHT 6 SPLT 77X108 (DRAPES) ×1 IMPLANT
ELECT REM PT RETURN 15FT ADLT (MISCELLANEOUS) ×2 IMPLANT
GAUZE 4X4 16PLY ~~LOC~~+RFID DBL (SPONGE) ×2 IMPLANT
GLOVE BIOGEL PI IND STRL 7.0 (GLOVE) ×4 IMPLANT
GLOVE SURG SS PI 7.0 STRL IVOR (GLOVE) ×4 IMPLANT
GOWN STRL REUS W/ TWL LRG LVL3 (GOWN DISPOSABLE) ×4 IMPLANT
GOWN STRL REUS W/ TWL XL LVL3 (GOWN DISPOSABLE) IMPLANT
GOWN STRL REUS W/TWL LRG LVL3 (GOWN DISPOSABLE) ×2
GOWN STRL REUS W/TWL XL LVL3 (GOWN DISPOSABLE)
IRRIG SUCT STRYKERFLOW 2 WTIP (MISCELLANEOUS) ×1
IRRIGATION SUCT STRKRFLW 2 WTP (MISCELLANEOUS) ×2 IMPLANT
KIT BASIN OR (CUSTOM PROCEDURE TRAY) ×2 IMPLANT
KIT TURNOVER KIT A (KITS) IMPLANT
LUBRICANT JELLY K Y 4OZ (MISCELLANEOUS) IMPLANT
MARKER SKIN DUAL TIP RULER LAB (MISCELLANEOUS) ×2 IMPLANT
NEEDLE HYPO 22GX1.5 SAFETY (NEEDLE) ×2 IMPLANT
PACK CARDIOVASCULAR III (CUSTOM PROCEDURE TRAY) ×1 IMPLANT
SCISSORS LAP 5X35 DISP (ENDOMECHANICALS) IMPLANT
SEAL CANN UNIV 5-8 DVNC XI (MISCELLANEOUS) ×8 IMPLANT
SEAL XI 5MM-8MM UNIVERSAL (MISCELLANEOUS) ×4
SEALER VESSEL DA VINCI XI (MISCELLANEOUS) ×1
SEALER VESSEL EXT DVNC XI (MISCELLANEOUS) ×2 IMPLANT
SET TUBE SMOKE EVAC HIGH FLOW (TUBING) ×2 IMPLANT
SOLUTION ANTFG W/FOAM PAD STRL (MISCELLANEOUS) ×1 IMPLANT
SOLUTION ELECTROLUBE (MISCELLANEOUS) IMPLANT
SPONGE DRAIN TRACH 4X4 STRL 2S (GAUZE/BANDAGES/DRESSINGS) IMPLANT
SUT ETHIBOND 0 36 GRN (SUTURE) ×4 IMPLANT
SUT MNCRL AB 4-0 PS2 18 (SUTURE) ×2 IMPLANT
SUT SILK 0 SH 30 (SUTURE) IMPLANT
SUT SILK 2 0 SH (SUTURE) IMPLANT
SYR 20ML LL LF (SYRINGE) ×4 IMPLANT
TIP INNERVISION DETACH 40FR (MISCELLANEOUS) IMPLANT
TIP INNERVISION DETACH 50FR (MISCELLANEOUS) IMPLANT
TIP INNERVISION DETACH 56FR (MISCELLANEOUS) IMPLANT
TOWEL OR 17X26 10 PK STRL BLUE (TOWEL DISPOSABLE) ×2 IMPLANT
TRAY FOLEY MTR SLVR 14FR STAT (SET/KITS/TRAYS/PACK) IMPLANT
TROCAR Z-THREAD OPTICAL 5X100M (TROCAR) ×1 IMPLANT
TUBE GASTRO BOLUS 18FR ENFIT (TUBING) IMPLANT

## 2022-11-06 NOTE — Progress Notes (Signed)
TIME GIVEN REPORT 1630

## 2022-11-06 NOTE — Anesthesia Postprocedure Evaluation (Signed)
Anesthesia Post Note  Patient: Monica White  Procedure(s) Performed: XI ROBOTIC REDUCTIONOF INCARCERATED HIATAL HERNIA REPAIR, LAPAROSCOPIC G-TUBE INSERTION, EGD (Abdomen)     Patient location during evaluation: PACU Anesthesia Type: General Level of consciousness: awake and alert Pain management: pain level controlled Vital Signs Assessment: post-procedure vital signs reviewed and stable Respiratory status: spontaneous breathing, nonlabored ventilation, respiratory function stable and patient connected to nasal cannula oxygen Cardiovascular status: blood pressure returned to baseline and stable Postop Assessment: no apparent nausea or vomiting Anesthetic complications: no  No notable events documented.  Last Vitals:  Vitals:   11/06/22 1745 11/06/22 1807  BP: 117/67 119/61  Pulse: 61 66  Resp: 14 18  Temp:  (!) 36.4 C  SpO2: 95% 97%    Last Pain:  Vitals:   11/06/22 1807  TempSrc: Axillary  PainSc: [place]

## 2022-11-06 NOTE — Transfer of Care (Signed)
Immediate Anesthesia Transfer of Care Note  Patient: Monica White  Procedure(s) Performed: XI ROBOTIC REDUCTIONOF INCARCERATED HIATAL HERNIA REPAIR, LAPAROSCOPIC G-TUBE INSERTION, EGD (Abdomen)  Patient Location: PACU  Anesthesia Type:General  Level of Consciousness: awake, alert , and oriented  Airway & Oxygen Therapy: Patient Spontanous Breathing and Patient connected to face mask oxygen  Post-op Assessment: Report given to RN, Post -op Vital signs reviewed and stable, and Patient moving all extremities X 4  Post vital signs: Reviewed and stable  Last Vitals:  Vitals Value Taken Time  BP 132/70   Temp    Pulse 84   Resp 12   SpO2 98     Last Pain:  Vitals:   11/06/22 1225  TempSrc: Oral  PainSc: 0-No pain      Patients Stated Pain Goal: 0 (82/95/62 1308)  Complications: No notable events documented.

## 2022-11-06 NOTE — Anesthesia Preprocedure Evaluation (Signed)
Anesthesia Evaluation  Patient identified by MRN, date of birth, ID band Patient awake    Reviewed: Allergy & Precautions, NPO status , Patient's Chart, lab work & pertinent test results  Airway Mallampati: II  TM Distance: >3 FB Neck ROM: Full    Dental  (+) Teeth Intact, Dental Advisory Given   Pulmonary neg pulmonary ROS   Pulmonary exam normal breath sounds clear to auscultation       Cardiovascular negative cardio ROS Normal cardiovascular exam Rhythm:Regular Rate:Normal     Neuro/Psych negative neurological ROS     GI/Hepatic Neg liver ROS, hiatal hernia,GERD  Medicated,,  Endo/Other  Hypothyroidism    Renal/GU negative Renal ROS     Musculoskeletal  (+) Arthritis ,    Abdominal   Peds  Hematology negative hematology ROS (+)   Anesthesia Other Findings Day of surgery medications reviewed with the patient.  Reproductive/Obstetrics                             Anesthesia Physical Anesthesia Plan  ASA: 3  Anesthesia Plan: General   Post-op Pain Management: Ofirmev IV (intra-op)*   Induction: Intravenous  PONV Risk Score and Plan: 3 and Dexamethasone, Ondansetron and Treatment may vary due to age or medical condition  Airway Management Planned: Oral ETT  Additional Equipment:   Intra-op Plan:   Post-operative Plan: Extubation in OR  Informed Consent: I have reviewed the patients History and Physical, chart, labs and discussed the procedure including the risks, benefits and alternatives for the proposed anesthesia with the patient or authorized representative who has indicated his/her understanding and acceptance.     Dental advisory given  Plan Discussed with: CRNA  Anesthesia Plan Comments:        Anesthesia Quick Evaluation

## 2022-11-06 NOTE — Progress Notes (Signed)
Pre Procedure note for inpatients:   Monica White has been scheduled for Procedure(s): XI ROBOTIC ASSISTED HIATAL HERNIA REPAIR WITH MESH INSERTION, G-TUBE INSERTION, POSSIBLE   EGD (N/A) today. The various methods of treatment have been discussed with the patient. After consideration of the risks, benefits and treatment options the patient has consented to the planned procedure.   The patient has been seen and labs reviewed. There are no changes in the patient's condition to prevent proceeding with the planned procedure today.  Recent labs:  Lab Results  Component Value Date   WBC 8.8 10/31/2022   HGB 14.3 10/31/2022   HCT 42.5 10/31/2022   PLT 454 (H) 10/31/2022   GLUCOSE 166 (H) 10/31/2022   CHOL 204 (H) 01/06/2021   TRIG 102 01/06/2021   HDL 57 01/06/2021   LDLCALC 129 (H) 01/06/2021   ALT 19 09/19/2017   AST 19 01/06/2021   NA 141 10/31/2022   K 3.8 10/31/2022   CL 107 10/31/2022   CREATININE 0.75 10/31/2022   BUN 22 10/31/2022   CO2 22 10/31/2022   INR 0.94 09/19/2017   HGBA1C 5.5 09/20/2017    Mickeal Skinner, MD 11/06/2022 12:26 PM

## 2022-11-06 NOTE — Anesthesia Procedure Notes (Signed)
Procedure Name: Intubation Date/Time: 11/06/2022 1:42 PM  Performed by: [place] Manus Rudd, CRNAPre-anesthesia Checklist: Patient identified, Emergency Drugs available, Suction available and Patient being monitored Patient Re-evaluated:Patient Re-evaluated prior to induction Oxygen Delivery Method: Circle system utilized Preoxygenation: Pre-oxygenation with 100% oxygen Induction Type: IV induction Ventilation: Mask ventilation without difficulty Laryngoscope Size: Mac and 3 Grade View: Grade I Tube type: Oral Tube size: 7.0 mm Number of attempts: 1 Airway Equipment and Method: Stylet and Oral airway Placement Confirmation: ETT inserted through vocal cords under direct vision, positive ETCO2 and breath sounds checked- equal and bilateral Secured at: 20 cm Tube secured with: Tape Dental Injury: Teeth and Oropharynx as per pre-operative assessment

## 2022-11-06 NOTE — Op Note (Signed)
Preoperative diagnosis: recurrent giant hiatal hernia  Postoperative diagnosis: same   Procedure: robotic reduction of incarcerated Type IV hiatal hernia, laparoscopic gastrostomy tube insertion  Surgeon: Gurney Maxin, M.D.  Asst: Romana Juniper, M.D.  Anesthesia: general  Indications for procedure: Monica White is a 81 y.o. year old female with symptoms of nausea and back pain and findings of large recurrent hiatal hernia. She was unable to tolerate a liquid diet and was therefore kept inpatient and brought to surgery.  Description of procedure: The patient was brought into the operative suite. Anesthesia was administered with General endotracheal anesthesia. WHO checklist was applied. The patient was then placed in supine position. The area was prepped and draped in the usual sterile fashion.  Next, a small periumbilical incision was made. A 42m trocar was used to gain access to the peritoneal cavity by optical entry technique. Pneumoperitoneum was applied with a high flow and low pressure. The laparoscope was reinserted to confirm position.  4 additional trocars were placed one 5 mm trocar was placed in the right upper quadrant, one 8 mm trocar was placed in the right midabdomen, one 8 mm trocar was placed in the left mid abdomen, and one 8 mm trocar was placed in the left lateral abdomen.  Patient was placed in a steep reverse Trendelenburg position.  Nathanson retractor is placed in a subxiphoid incision to retract the liver.  Da Vinci robot was then docked.  The handle hernia was inspected and partially reduced which included the entirety of the omentum, transverse colon, and the stomach.  Care was taken to dissect and reduce the safely.  Next the hernia sac was incised away from the crus starting on the right side of the crus and moving towards the lateral aspect.  Once the hernia sac was freed from the crus dissection was continued into the chest.  Laterally the hernia sac was adhered to  the pleura.  Pleural spaces were entered on both left and right and small areas.  Moving more cephalad, was additional higher amounts of scar tissue.  At this time we turned our attention laterally and posteriorly.  There is a large vessel moving over the right crus to the lesser curve of the stomach that at 1 time was probably part of the pars flaccida or possible replaced hepatic vessel.  This was fused to the right crus.  Instead of working more on the right side turned my attention to the left side taking down a couple of the short gastrics with vessel sealer.  The lateral stomach wall was adherent to the hernia sac.  The left pleura was entered and trying to dissect the hernia sac away from the left crus posteriorly.  Looking inside the sac we were able to see the esophagus and the remainder of the stomach. Rather than continue dissection knowing that dissection along the aorta would also be difficult to find appropriate planes, we considered a bail approach.    I performed an upper endoscopy which easily traversed into the stomach.  The distance from the GE junction to the crus was approximately 3 cm by endoscopy.  The endoscopy easily traversed to the hiatal hernia crus opening.  At this time, rather than dissect further, since the stomach and colon were reduced, we decided to place a gastrostomy tube and not perform a repair of the hiatus. A portion of the mid body was chosen for g tube. A 2-0 silk was used to make a pursestring. Next, the g tube was introduced  through a new left upper quadrant about 3 cm off the costal margin. A gastrotomy was made and g tube introduced to the stomach and balloon instilled with 10 cc saline. Next, 2 transfascial 2-0 silk sutures were placed to the stomach and brought through the fascia with suture passer in superior and inferior areas to the g tube. The g tube was snug at 4 cm.  Pneumoperitoneum was then removed. Trocars were removed. Incisions were closed with 4-0  monocryl with subcuticular stitch. Dermabond was put in place for dressing. All counts were correct.  Findings: Type IV hiatal hernia including stomach, duodenum, transverse colon, and omentum  Specimen: none  Implant: 18 Fr gastrostomy tube   Blood loss: 50 ml  Local anesthesia:  50 ml Exparel:Marcaine Mix  Complications: unable to fully dissect hiatal hernia and repair due to level of scarring of pleural, stomach, and esophagus to hernia sac  Gurney Maxin, M.D. General, Bariatric, & Minimally Invasive Surgery [facility]

## 2022-11-07 ENCOUNTER — Encounter (HOSPITAL_COMMUNITY): Payer: Self-pay | Admitting: General Surgery

## 2022-11-07 LAB — CBC
HCT: 38.1 % (ref 36.0–46.0)
Hemoglobin: 12.2 g/dL (ref 12.0–15.0)
MCH: 30.6 pg (ref 26.0–34.0)
MCHC: 32 g/dL (ref 30.0–36.0)
MCV: 95.5 fL (ref 80.0–100.0)
Platelets: 368 10*3/uL (ref 150–400)
RBC: 3.99 MIL/uL (ref 3.87–5.11)
RDW: 12.4 % (ref 11.5–15.5)
WBC: 15.7 10*3/uL — ABNORMAL HIGH (ref 4.0–10.5)
nRBC: 0 % (ref 0.0–0.2)

## 2022-11-07 MED ORDER — ENSURE ENLIVE PO LIQD
237.0000 mL | Freq: Two times a day (BID) | ORAL | Status: DC
  Administered 2022-11-07: 237 mL via ORAL

## 2022-11-07 MED ORDER — ESTRADIOL 0.5 MG PO TABS
0.5000 mg | ORAL_TABLET | Freq: Every day | ORAL | Status: DC
  Administered 2022-11-07 – 2022-11-08 (×2): 0.5 mg via ORAL
  Filled 2022-11-07 (×3): qty 1

## 2022-11-07 NOTE — Plan of Care (Signed)
  Problem: Education: Goal: Knowledge of General Education information will improve Description Including pain rating scale, medication(s)/side effects and non-pharmacologic comfort measures Outcome: Progressing   Problem: Health Behavior/Discharge Planning: Goal: Ability to manage health-related needs will improve Outcome: Progressing   

## 2022-11-07 NOTE — Progress Notes (Signed)
Mobility Specialist - Progress Note   11/07/22 1610  Mobility  Activity Ambulated with assistance in hallway  Level of Assistance Modified independent, requires aide device or extra time  Assistive Device  (IV Pole)  Distance Ambulated (ft) 250 ft  Activity Response Tolerated well  Mobility Referral Yes  $Mobility charge 1 Mobility   Pt received in bed and agreeable to mobility. Pt c/o feeling SOB. Nurse notified. Pt to bed after session with all needs met.     During mobility: 96% SpO2   Set designer

## 2022-11-07 NOTE — TOC CM/SW Note (Signed)
Transition of Care [facility]) Screening Note  Patient Details  Name: Monica White Date of Birth: 10-Dec-1940  [facility]) CM/SW Contact:    Sherie Don, LCSW Phone Number: 11/07/2022, 11:01 AM  Transition of Care Department [facility]) has reviewed patient and no TOC needs have been identified at this time. We will continue to monitor patient advancement through interdisciplinary progression rounds. If new patient transition needs arise, please place a TOC consult.

## 2022-11-07 NOTE — Progress Notes (Signed)
  1 Day Post-Op   Chief Complaint/Subjective: Pain issues over left side  Objective: Vital signs in last 24 hours: Temp:  [97.3 F (36.3 C)-97.9 F (36.6 C)] 97.6 F (36.4 C) (12/13 0614) Pulse Rate:  [61-81] 65 (12/13 0614) Resp:  [12-20] 18 (12/13 0614) BP: (101-145)/(60-81) 101/60 (12/13 0614) SpO2:  [92 %-98 %] 94 % (12/13 0614) Weight:  [62 kg] 62 kg (12/12 1225) Last BM Date : 11/05/22 Intake/Output from previous day: 12/12 0701 - 12/13 0700 In: 2111.6 [P.O.:360; I.V.:1551.6; IV Piggyback:200] Out: 100 [Urine:75; Blood:25]  PE: Gen: NAd Resp: nonlabored Card: RRR Abd: soft, incisions c/d/I, tube in position  Lab Results:  Recent Labs    11/06/22 1812 11/07/22 0509  WBC 24.3* 15.7*  HGB 12.8 12.2  HCT 40.3 38.1  PLT 352 368   Recent Labs    11/06/22 1812  CREATININE 0.60   No results for input(s): "LABPROT", "INR" in the last 72 hours.    Component Value Date/Time   NA 141 10/31/2022 2241   NA 142 11/29/2021 1048   K 3.8 10/31/2022 2241   CL 107 10/31/2022 2241   CO2 22 10/31/2022 2241   GLUCOSE 166 (H) 10/31/2022 2241   BUN 22 10/31/2022 2241   BUN 21 11/29/2021 1048   CREATININE 0.60 11/06/2022 1812   CALCIUM 8.5 (L) 10/31/2022 2241   PROT 6.3 (L) 09/19/2017 1820   ALBUMIN 4.0 09/19/2017 1820   AST 19 01/06/2021 0939   ALT 19 09/19/2017 1820   ALKPHOS 72 09/19/2017 1820   BILITOT 0.4 09/19/2017 1820   GFRNONAA >60 11/06/2022 1812   GFRAA >60 02/17/2018 1123    Anti-infectives: Anti-infectives (From admission, onward)    Start     Dose/Rate Route Frequency Ordered Stop   11/06/22 1230  ceFAZolin (ANCEF) IVPB 2g/100 mL premix        2 g 200 mL/hr over 30 Minutes Intravenous On call to O.R. 11/06/22 1226 11/06/22 1351       Assessment/Plan  s/p Procedure(s): XI ROBOTIC REDUCTIONOF INCARCERATED HIATAL HERNIA REPAIR, LAPAROSCOPIC G-TUBE INSERTION, EGD 11/06/2022   FEN - full liquids VTE - lovenox ID - no issues Disposition -  advance diet, assess pain, ambulate   LOS: 5 days   I reviewed last 24 h vitals and pain scores, last 48 h intake and output, last 24 h labs and trends, and last 24 h imaging results.  This care required high  level of medical decision making.   [place] [facility] 11/07/2022, 8:37 AM Please see Amion for pager number during day hours 7:00am-4:30pm or 7:00am -11:30am on weekends

## 2022-11-08 MED ORDER — ONDANSETRON 4 MG PO TBDP
4.0000 mg | ORAL_TABLET | Freq: Four times a day (QID) | ORAL | Status: DC
  Administered 2022-11-08 – 2022-11-09 (×4): 4 mg via ORAL
  Filled 2022-11-08 (×4): qty 1

## 2022-11-08 MED ORDER — ONDANSETRON HCL 4 MG/2ML IJ SOLN
4.0000 mg | Freq: Four times a day (QID) | INTRAMUSCULAR | Status: DC
  Administered 2022-11-08: 4 mg via INTRAVENOUS
  Filled 2022-11-08: qty 2

## 2022-11-08 NOTE — Progress Notes (Signed)
  2 Days Post-Op   Chief Complaint/Subjective: She had nausea overnight and moderate pain in the left shoulder. She tolerated liquids well yesterday  Objective: Vital signs in last 24 hours: Temp:  [97.5 F (36.4 C)-98.3 F (36.8 C)] 97.8 F (36.6 C) (12/14 0601) Pulse Rate:  [63-65] 65 (12/14 0601) Resp:  [18] 18 (12/14 0601) BP: (109-143)/(54-78) 143/74 (12/14 0601) SpO2:  [92 %-94 %] 92 % (12/14 0601) Last BM Date : 11/05/22 Intake/Output from previous day: 12/13 0701 - 12/14 0700 In: 1110 [P.O.:480; I.V.:630] Out: -   PE: Gen: NAd Resp: nonlabored Card: RRR Abd: soft, incisions intact, g tube  Lab Results:  Recent Labs    11/06/22 1812 11/07/22 0509  WBC 24.3* 15.7*  HGB 12.8 12.2  HCT 40.3 38.1  PLT 352 368   Recent Labs    11/06/22 1812  CREATININE 0.60   No results for input(s): "LABPROT", "INR" in the last 72 hours.    Component Value Date/Time   NA 141 10/31/2022 2241   NA 142 11/29/2021 1048   K 3.8 10/31/2022 2241   CL 107 10/31/2022 2241   CO2 22 10/31/2022 2241   GLUCOSE 166 (H) 10/31/2022 2241   BUN 22 10/31/2022 2241   BUN 21 11/29/2021 1048   CREATININE 0.60 11/06/2022 1812   CALCIUM 8.5 (L) 10/31/2022 2241   PROT 6.3 (L) 09/19/2017 1820   ALBUMIN 4.0 09/19/2017 1820   AST 19 01/06/2021 0939   ALT 19 09/19/2017 1820   ALKPHOS 72 09/19/2017 1820   BILITOT 0.4 09/19/2017 1820   GFRNONAA >60 11/06/2022 1812   GFRAA >60 02/17/2018 1123    Anti-infectives: Anti-infectives (From admission, onward)    Start     Dose/Rate Route Frequency Ordered Stop   11/06/22 1230  ceFAZolin (ANCEF) IVPB 2g/100 mL premix        2 g 200 mL/hr over 30 Minutes Intravenous On call to O.R. 11/06/22 1226 11/06/22 1351       Assessment/Plan  s/p Procedure(s): XI ROBOTIC REDUCTIONOF INCARCERATED HIATAL HERNIA REPAIR, LAPAROSCOPIC G-TUBE INSERTION, EGD 11/06/2022   FEN - full liquids VTE - lovenox ID - no issues Disposition - inpatient   LOS: 6  days   I reviewed last 24 h vitals and pain scores, last 48 h intake and output, last 24 h labs and trends, and last 24 h imaging results.  This care required high  level of medical decision making.   [place] [facility] 11/08/2022, 9:12 AM Please see Amion for pager number during day hours 7:00am-4:30pm or 7:00am -11:30am on weekends

## 2022-11-08 NOTE — Progress Notes (Signed)
Did teaching for G tube per Dr. Kieth Brightly. The patient and granddaughter were given instructions for flushing the G tube and using gravity for liquids through the G tube. All questions were answered.

## 2022-11-08 NOTE — Care Management Important Message (Signed)
Important Message  Patient Details IM Letter given. Name: Monica White MRN: [id number] Date of Birth: 21-Nov-1941   Medicare Important Message Given:  Yes     Kerin Salen 11/08/2022, 1:52 PM

## 2022-11-09 MED ORDER — ONDANSETRON 4 MG PO TBDP: 4.0000 mg | ORAL_TABLET | Freq: Four times a day (QID) | ORAL | 3 refills | Status: AC | PRN

## 2022-11-09 NOTE — Progress Notes (Signed)
Patient was given discharge instructions, and all questions were answered.  Patient was stable for discharge and was taken to the main exit by wheelchair. 

## 2022-11-09 NOTE — Discharge Instructions (Signed)
EATING AFTER YOUR ESOPHAGEAL SURGERY (Stomach Fundoplication, Hiatal Hernia repair, Achalasia surgery, etc)  ######################################################################  EAT Start with a pureed / full liquid diet (see below) Gradually transition to a high fiber diet with a fiber supplement over the next month after discharge.    WALK Walk an hour a day.  Control your pain to do that.    CONTROL PAIN Control pain so that you can walk, sleep, tolerate sneezing/coughing, go up/down stairs.  HAVE A BOWEL MOVEMENT DAILY Keep your bowels regular to avoid problems.  OK to try a laxative to override constipation.  OK to use an antidairrheal to slow down diarrhea.  Call if not better after 2 tries  CALL IF YOU HAVE PROBLEMS/CONCERNS Call if you are still struggling despite following these instructions. Call if you have concerns not answered by these instructions  ######################################################################   After your esophageal surgery, expect some sticking with swallowing over the next 1-2 months.    If food sticks when you eat, it is called "dysphagia".  This is due to swelling around your esophagus at the wrap & hiatal diaphragm repair.  It will gradually ease off over the next few months.  To help you through this temporary phase, we start you out on a pureed (blenderized) diet.  Your first meal in the hospital was thin liquids.  You should have been given a pureed diet by the time you left the hospital.  We ask patients to stay on a pureed diet for the first 2-3 weeks to avoid anything getting "stuck" near your recent surgery.  Don't be alarmed if your ability to swallow doesn't progress according to this plan.  Everyone is different and some diets can advance more or less quickly.    It is often helpful to crush your medications or split them as they can sometimes stick, especially the first week or so.   Some BASIC RULES to follow  are:  Maintain an upright position whenever eating or drinking.  Take small bites - just a teaspoon size bite at a time.  Eat slowly.  It may also help to eat only one food at a time.  Consider nibbling through smaller, more frequent meals & avoid the urge to eat BIG meals  Do not push through feelings of fullness, nausea, or bloatedness  Do not mix solid foods and liquids in the same mouthful  Try not to "wash foods down" with large gulps of liquids.  Avoid carbonated (bubbly/fizzy) drinks.    Avoid foods that make you feel gassy or bloated.  Start with bland foods first.  Wait on trying greasy, fried, or spicy meals until you are tolerating more bland solids well.  Understand that it will be hard to burp and belch at first.  This gradually improves with time.  Expect to be more gassy/flatulent/bloated initially.  Walking will help your body manage it better.  Consider using medications for bloating that contain simethicone such as  Maalox or Gas-X   Consider crushing her medications, especially smaller pills.  The ability to swallow pills should get easier after a few weeks  Eat in a relaxed atmosphere & minimize distractions.  Avoid talking while eating.    Do not use straws.  Following each meal, sit in an upright position (90 degree angle) for 60 to 90 minutes.  Going for a short walk can help as well  If food does stick, don't panic.  Try to relax and let the food pass on its own.    Sipping WARM LIQUID such as strong hot black tea can also help slide it down.   Be gradual in changes & use common sense:  -If you easily tolerating a certain "level" of foods, advance to the next level gradually -If you are having trouble swallowing a particular food, then avoid it.   -If food is sticking when you advance your diet, go back to thinner previous diet (the lower LEVEL) for 1-2 days.  LEVEL 1 = PUREED DIET  Do for the first 2 WEEKS AFTER SURGERY  -Foods in this group are  pureed or blenderized to a smooth, mashed potato-like consistency.  -If necessary, the pureed foods can keep their shape with the addition of a thickening agent.   -Meat should be pureed to a smooth, pasty consistency.  Hot broth or gravy may be added to the pureed meat, approximately 1 oz. of liquid per 3 oz. serving of meat. -CAUTION:  If any foods do not puree into a smooth consistency, swallowing will be more difficult.  (For example, nuts or seeds sometimes do not blend well.)  Hot Foods Cold Foods  Pureed scrambled eggs and cheese Pureed cottage cheese  Baby cereals Thickened juices and nectars  Thinned cooked cereals (no lumps) Thickened milk or eggnog  Pureed French toast or pancakes Ensure  Mashed potatoes Ice cream  Pureed parsley, au gratin, scalloped potatoes, candied sweet potatoes Fruit or Italian ice, sherbet  Pureed buttered or alfredo noodles Plain yogurt  Pureed vegetables (no corn or peas) Instant breakfast  Pureed soups and creamed soups Smooth pudding, mousse, custard  Pureed scalloped apples Whipped gelatin  Gravies Sugar, syrup, honey, jelly  Sauces, cheese, tomato, barbecue, white, creamed Cream  Any baby food Creamer  Alcohol in moderation (not beer or champagne) Margarine  Coffee or tea Mayonnaise   Ketchup, mustard   Apple sauce   SAMPLE MENU:  PUREED DIET Breakfast Lunch Dinner   Orange juice, 1/2 cup  Cream of wheat, 1/2 cup  Pineapple juice, 1/2 cup  Pureed turkey, barley soup, 3/4 cup  Pureed Hawaiian chicken, 3 oz   Scrambled eggs, mashed or blended with cheese, 1/2 cup  Tea or coffee, 1 cup   Whole milk, 1 cup   Non-dairy creamer, 2 Tbsp.  Mashed potatoes, 1/2 cup  Pureed cooled broccoli, 1/2 cup  Apple sauce, 1/2 cup  Coffee or tea  Mashed potatoes, 1/2 cup  Pureed spinach, 1/2 cup  Frozen yogurt, 1/2 cup  Tea or coffee      LEVEL 2 = SOFT DIET  After your first 2 weeks, you can advance to a soft diet.   Keep on this  diet until everything goes down easily.  Hot Foods Cold Foods  White fish Cottage cheese  Stuffed fish Junior baby fruit  Baby food meals Semi thickened juices  Minced soft cooked, scrambled, poached eggs nectars  Souffle & omelets Ripe mashed bananas  Cooked cereals Canned fruit, pineapple sauce, milk  potatoes Milkshake  Buttered or Alfredo noodles Custard  Cooked cooled vegetable Puddings, including tapioca  Sherbet Yogurt  Vegetable soup or alphabet soup Fruit ice, Italian ice  Gravies Whipped gelatin  Sugar, syrup, honey, jelly Junior baby desserts  Sauces:  Cheese, creamed, barbecue, tomato, white Cream  Coffee or tea Margarine   SAMPLE MENU:  LEVEL 2 Breakfast Lunch Dinner   Orange juice, 1/2 cup  Oatmeal, 1/2 cup  Scrambled eggs with cheese, 1/2 cup  Decaffeinated tea, 1 cup  Whole milk, 1 cup    Non-dairy creamer, 2 Tbsp  Pineapple juice, 1/2 cup  Minced beef, 3 oz  Gravy, 2 Tbsp  Mashed potatoes, 1/2 cup  Minced fresh broccoli, 1/2 cup  Applesauce, 1/2 cup  Coffee, 1 cup  Turkey, barley soup, 3/4 cup  Minced Hawaiian chicken, 3 oz  Mashed potatoes, 1/2 cup  Cooked spinach, 1/2 cup  Frozen yogurt, 1/2 cup  Non-dairy creamer, 2 Tbsp      LEVEL 3 = CHOPPED DIET  -After all the foods in level 2 (soft diet) are passing through well you should advance up to more chopped foods.  -It is still important to cut these foods into small pieces and eat slowly.  Hot Foods Cold Foods  Poultry Cottage cheese  Chopped Swedish meatballs Yogurt  Meat salads (ground or flaked meat) Milk  Flaked fish (tuna) Milkshakes  Poached or scrambled eggs Soft, cold, dry cereal  Souffles and omelets Fruit juices or nectars  Cooked cereals Chopped canned fruit  Chopped French toast or pancakes Canned fruit cocktail  Noodles or pasta (no rice) Pudding, mousse, custard  Cooked vegetables (no frozen peas, corn, or mixed vegetables) Green salad  Canned small sweet peas  Ice cream  Creamed soup or vegetable soup Fruit ice, Italian ice  Pureed vegetable soup or alphabet soup Non-dairy creamer  Ground scalloped apples Margarine  Gravies Mayonnaise  Sauces:  Cheese, creamed, barbecue, tomato, white Ketchup  Coffee or tea Mustard   SAMPLE MENU:  LEVEL 3 Breakfast Lunch Dinner   Orange juice, 1/2 cup  Oatmeal, 1/2 cup  Scrambled eggs with cheese, 1/2 cup  Decaffeinated tea, 1 cup  Whole milk, 1 cup  Non-dairy creamer, 2 Tbsp  Ketchup, 1 Tbsp  Margarine, 1 tsp  Salt, 1/4 tsp  Sugar, 2 tsp  Pineapple juice, 1/2 cup  Ground beef, 3 oz  Gravy, 2 Tbsp  Mashed potatoes, 1/2 cup  Cooked spinach, 1/2 cup  Applesauce, 1/2 cup  Decaffeinated coffee  Whole milk  Non-dairy creamer, 2 Tbsp  Margarine, 1 tsp  Salt, 1/4 tsp  Pureed turkey, barley soup, 3/4 cup  Barbecue chicken, 3 oz  Mashed potatoes, 1/2 cup  Ground fresh broccoli, 1/2 cup  Frozen yogurt, 1/2 cup  Decaffeinated tea, 1 cup  Non-dairy creamer, 2 Tbsp  Margarine, 1 tsp  Salt, 1/4 tsp  Sugar, 1 tsp    LEVEL 4:  REGULAR FOODS  -Foods in this group are soft, moist, regularly textured foods.   -This level includes meat and breads, which tend to be the hardest things to swallow.   -Eat very slowly, chew well and continue to avoid carbonated drinks. -most people are at this level in 4-6 weeks  Hot Foods Cold Foods  Baked fish or skinned Soft cheeses - cottage cheese  Souffles and omelets Cream cheese  Eggs Yogurt  Stuffed shells Milk  Spaghetti with meat sauce Milkshakes  Cooked cereal Cold dry cereals (no nuts, dried fruit, coconut)  French toast or pancakes Crackers  Buttered toast Fruit juices or nectars  Noodles or pasta (no rice) Canned fruit  Potatoes (all types) Ripe bananas  Soft, cooked vegetables (no corn, lima, or baked beans) Peeled, ripe, fresh fruit  Creamed soups or vegetable soup Cakes (no nuts, dried fruit, coconut)  Canned chicken  noodle soup Plain doughnuts  Gravies Ice cream  Bacon dressing Pudding, mousse, custard  Sauces:  Cheese, creamed, barbecue, tomato, white Fruit ice, Italian ice, sherbet  Decaffeinated tea or coffee Whipped gelatin  Pork chops Regular gelatin     Canned fruited gelatin molds   Sugar, syrup, honey, jam, jelly   Cream   Non-dairy   Margarine   Oil   Mayonnaise   Ketchup   Mustard   TROUBLESHOOTING IRREGULAR BOWELS  1) Avoid extremes of bowel movements (no bad constipation/diarrhea)  2) Miralax 17gm mixed in 8oz. water or juice-daily. May use BID as needed.  3) Gas-x,Phazyme, etc. as needed for gas & bloating.  4) Soft,bland diet. No spicy,greasy,fried foods.  5) Prilosec over-the-counter as needed  6) May hold gluten/wheat products from diet to see if symptoms improve.  7) May try probiotics (Align, Activa, etc) to help calm the bowels down  7) If symptoms become worse call back immediately.    If you have any questions please call our office at [facility]: [phone].  

## 2022-11-12 ENCOUNTER — Encounter (HOSPITAL_COMMUNITY): Admission: RE | Payer: Self-pay | Source: Ambulatory Visit

## 2022-11-12 ENCOUNTER — Ambulatory Visit (HOSPITAL_COMMUNITY): Admission: RE | Admit: 2022-11-12 | Payer: Medicare Other | Source: Ambulatory Visit | Admitting: General Surgery

## 2022-11-12 SURGERY — REPAIR, HERNIA, HIATAL, ROBOT-ASSISTED
Anesthesia: General

## 2022-11-14 NOTE — H&P (Signed)
[facility] Admission Note  Rue [address]res Dec 02, 1940  [id number].    Requesting MD: Deno Etienne  Chief Complaint/Reason for Consult: recurrent hiatal hernia  HPI:  Monica White is a 81 y.o. female with prior h/o type III paraesophageal hernia s/p laparoscopic paraesophageal hernia repair, laparoscopic Toupet fundoplication 2/97/98 by Dr. Kieth Brightly. She presented to the ED 11/01/22 complaining of epigastric abdominal pain after eating. Associated with nausea, no emesis. The symptoms have progressively gotten worse over the last month. In the ED she underwent CT scan which showed substantially progressed and now large left diaphragmatic hernia; completely intrathoracic stomach and splenic flexure which are incompletely visible on this exam; chest CT would be necessary to fully image, but there is no evidence of bowel obstruction, and no definite inflammation in the visible hernia sac. General surgery asked to see. Since arrival in the ED her symptoms have resolved.   Family History  Problem Relation Age of Onset   Heart disease Mother     Past Medical History:  Diagnosis Date   Anemia    Arthritis    Dyspnea    GERD (gastroesophageal reflux disease)    History of hiatal hernia    Hypercholesteremia    Hypothyroid 2015    Past Surgical History:  Procedure Laterality Date   COLONOSCOPY     HIATAL HERNIA REPAIR N/A 03/12/2022   Procedure: LAPAROSCOPIC REPAIR OF HIATAL HERNIA;  Surgeon: Kieth Brightly Arta Bruce, MD;  Location: [facility];  Service: General;  Laterality: N/A;   RIGHT HEART CATH AND CORONARY ANGIOGRAPHY N/A 12/01/2021   Procedure: RIGHT HEART CATH AND CORONARY ANGIOGRAPHY;  Surgeon: Sherren Mocha, MD;  Location: [facility];  Service: Cardiovascular;  Laterality: N/A;   TUBAL LIGATION  11/27/1983   UPPER GASTROINTESTINAL ENDOSCOPY     XI ROBOTIC ASSISTED HIATAL HERNIA REPAIR N/A 11/06/2022   Procedure: XI ROBOTIC REDUCTIONOF INCARCERATED HIATAL HERNIA REPAIR,  LAPAROSCOPIC G-TUBE INSERTION, EGD;  Surgeon: Kinsinger, Arta Bruce, MD;  Location: [facility];  Service: General;  Laterality: N/A;    Social History:  reports that she has never smoked. She has never used smokeless tobacco. She reports that she does not currently use alcohol. She reports that she does not use drugs.  Allergies:  Allergies  Allergen Reactions   Atorvastatin Other (See Comments)    Stiffness   Escitalopram Other (See Comments)    Stiffness   Pitavastatin Other (See Comments)    Stiffness   Tramadol Other (See Comments)    "Felt bad and headache"    No medications prior to admission.    Prior to Admission medications   Medication Sig Start Date End Date Taking? Authorizing Provider  Ascorbic Acid (VITAMIN C PO) Take 1 tablet by mouth daily.   Yes [provider]  cholecalciferol (VITAMIN D3) 25 MCG (1000 UNIT) tablet Take 1,000 Units by mouth daily.   Yes [provider]  Cyanocobalamin (B-12) 1000 MCG CAPS Take 1,000 mcg by mouth daily.   Yes [provider]  esomeprazole (NEXIUM) 20 MG packet Take 20 mg by mouth daily before breakfast. 11/02/22  Yes Kinsinger, Arta Bruce, MD  estradiol (ESTRACE) 0.5 MG tablet Take 0.5 mg by mouth daily.   Yes [provider]  levothyroxine (SYNTHROID, LEVOTHROID) 50 MCG tablet Take 50 mcg by mouth daily. 02/27/15  Yes [provider]  medroxyPROGESTERone (PROVERA) 2.5 MG tablet Take 1 tablet (2.5 mg total) by mouth daily. 03/04/15  Yes Nunzio Cobbs, MD  Multiple Vitamins-Minerals (  MULTIVITAMIN PO) Take 1 tablet by mouth daily.   Yes [provider]  Probiotic Product (PROBIOTIC PO) Take 1 capsule by mouth daily. Align   Yes [provider]  ondansetron (ZOFRAN-ODT) 4 MG disintegrating tablet Take 1 tablet (4 mg total) by mouth every 6 (six) hours as needed for nausea. 11/09/22   Kinsinger, Arta Bruce, MD    Blood pressure 133/68, pulse 60, temperature 98.1 F (36.7  C), temperature source Oral, resp. rate 14, height '5\' 7"'$  (1.702 m), weight 62 kg, last menstrual period 01/13/1994, SpO2 92 %. Physical Exam: General: pleasant, WD/WN female who is laying in bed in NAD Lungs: Respiratory effort nonlabored on room air Abd: soft, NT/ND, +BS, no masses, hernias, or organomegaly  No results found for this or any previous visit (from the past 48 hour(s)). No results found.    Assessment/Plan Recurrent hiatal hernia -Hx laparoscopic paraesophageal hernia repair, laparoscopic Toupet fundoplication by Dr. Kieth Brightly 03/12/22  - CT scan with substantially progressed and now large left diaphragmatic hernia; completely intrathoracic stomach and splenic flexure which are incompletely visible on this exam; chest CT would be necessary to fully image, but there is no evidence of bowel obstruction, and no definite inflammation in the visible hernia sac - WBC WNL, VSS. Abdominal exam benign. - No indication for acute surgical intervention. Dr. Kieth Brightly to see later this morning to discuss management options.   I reviewed ED provider notes, last 24 h vitals and pain scores, last 48 h intake and output, last 24 h labs and trends, and last 24 h imaging results.   Wellington Hampshire, [facility] 11/14/2022, 10:18 AM Please see Amion for pager number during day hours 7:00am-4:30pm

## 2022-11-28 ENCOUNTER — Other Ambulatory Visit: Payer: Self-pay | Admitting: General Surgery

## 2022-11-28 DIAGNOSIS — R5383 Other fatigue: Secondary | ICD-10-CM | POA: Diagnosis not present

## 2022-11-28 DIAGNOSIS — E039 Hypothyroidism, unspecified: Secondary | ICD-10-CM | POA: Diagnosis not present

## 2022-11-28 DIAGNOSIS — K449 Diaphragmatic hernia without obstruction or gangrene: Secondary | ICD-10-CM | POA: Diagnosis not present

## 2022-11-28 DIAGNOSIS — M6281 Muscle weakness (generalized): Secondary | ICD-10-CM | POA: Diagnosis not present

## 2022-11-30 DIAGNOSIS — M170 Bilateral primary osteoarthritis of knee: Secondary | ICD-10-CM | POA: Diagnosis not present

## 2022-11-30 DIAGNOSIS — M1711 Unilateral primary osteoarthritis, right knee: Secondary | ICD-10-CM | POA: Diagnosis not present

## 2022-11-30 DIAGNOSIS — M1712 Unilateral primary osteoarthritis, left knee: Secondary | ICD-10-CM | POA: Diagnosis not present

## 2022-12-07 DIAGNOSIS — R300 Dysuria: Secondary | ICD-10-CM | POA: Diagnosis not present

## 2022-12-07 DIAGNOSIS — N390 Urinary tract infection, site not specified: Secondary | ICD-10-CM | POA: Diagnosis not present

## 2022-12-07 DIAGNOSIS — R319 Hematuria, unspecified: Secondary | ICD-10-CM | POA: Diagnosis not present

## 2022-12-07 DIAGNOSIS — N3001 Acute cystitis with hematuria: Secondary | ICD-10-CM | POA: Diagnosis not present

## 2022-12-12 ENCOUNTER — Other Ambulatory Visit (HOSPITAL_COMMUNITY): Payer: Self-pay | Admitting: *Deleted

## 2022-12-13 ENCOUNTER — Encounter (HOSPITAL_COMMUNITY)
Admission: RE | Admit: 2022-12-13 | Discharge: 2022-12-13 | Disposition: A | Discharge status: Home / Self Care | Payer: Medicare Other | Source: Ambulatory Visit | Attending: Internal Medicine | Admitting: Internal Medicine

## 2022-12-13 DIAGNOSIS — E785 Hyperlipidemia, unspecified: Secondary | ICD-10-CM | POA: Insufficient documentation

## 2022-12-13 MED ORDER — INCLISIRAN SODIUM 284 MG/1.5ML ~~LOC~~ SOSY
PREFILLED_SYRINGE | SUBCUTANEOUS | Status: AC
  Filled 2022-12-13: qty 1.5

## 2022-12-13 MED ORDER — INCLISIRAN SODIUM 284 MG/1.5ML ~~LOC~~ SOSY
284.0000 mg | PREFILLED_SYRINGE | Freq: Once | SUBCUTANEOUS | Status: AC
  Administered 2022-12-13: 284 mg via SUBCUTANEOUS

## 2022-12-19 DIAGNOSIS — Z1231 Encounter for screening mammogram for malignant neoplasm of breast: Secondary | ICD-10-CM | POA: Diagnosis not present

## 2022-12-27 DIAGNOSIS — R928 Other abnormal and inconclusive findings on diagnostic imaging of breast: Secondary | ICD-10-CM | POA: Diagnosis not present

## 2022-12-27 DIAGNOSIS — N6489 Other specified disorders of breast: Secondary | ICD-10-CM | POA: Diagnosis not present

## 2022-12-28 ENCOUNTER — Ambulatory Visit
Admission: RE | Admit: 2022-12-28 | Discharge: 2022-12-28 | Disposition: A | Discharge status: Home / Self Care | Payer: Medicare Other | Source: Ambulatory Visit | Attending: General Surgery | Admitting: General Surgery

## 2022-12-28 DIAGNOSIS — N83202 Unspecified ovarian cyst, left side: Secondary | ICD-10-CM | POA: Diagnosis not present

## 2022-12-28 DIAGNOSIS — I700 Atherosclerosis of aorta: Secondary | ICD-10-CM | POA: Diagnosis not present

## 2022-12-28 DIAGNOSIS — J9811 Atelectasis: Secondary | ICD-10-CM | POA: Diagnosis not present

## 2022-12-28 DIAGNOSIS — K439 Ventral hernia without obstruction or gangrene: Secondary | ICD-10-CM | POA: Diagnosis not present

## 2022-12-28 DIAGNOSIS — K5730 Diverticulosis of large intestine without perforation or abscess without bleeding: Secondary | ICD-10-CM | POA: Diagnosis not present

## 2022-12-28 DIAGNOSIS — K449 Diaphragmatic hernia without obstruction or gangrene: Secondary | ICD-10-CM | POA: Diagnosis not present

## 2022-12-28 DIAGNOSIS — D259 Leiomyoma of uterus, unspecified: Secondary | ICD-10-CM | POA: Diagnosis not present

## 2022-12-28 MED ORDER — IOPAMIDOL (ISOVUE-370) INJECTION 76%
80.0000 mL | Freq: Once | INTRAVENOUS | Status: AC | PRN
  Administered 2022-12-28: 80 mL via INTRAVENOUS

## 2023-01-10 DIAGNOSIS — N6002 Solitary cyst of left breast: Secondary | ICD-10-CM | POA: Diagnosis not present

## 2023-01-23 DIAGNOSIS — M25651 Stiffness of right hip, not elsewhere classified: Secondary | ICD-10-CM | POA: Diagnosis not present

## 2023-01-23 DIAGNOSIS — R262 Difficulty in walking, not elsewhere classified: Secondary | ICD-10-CM | POA: Diagnosis not present

## 2023-01-23 DIAGNOSIS — U071 COVID-19: Secondary | ICD-10-CM | POA: Diagnosis not present

## 2023-01-23 DIAGNOSIS — M4807 Spinal stenosis, lumbosacral region: Secondary | ICD-10-CM | POA: Diagnosis not present

## 2023-01-23 DIAGNOSIS — M25652 Stiffness of left hip, not elsewhere classified: Secondary | ICD-10-CM | POA: Diagnosis not present

## 2023-01-25 DIAGNOSIS — M25652 Stiffness of left hip, not elsewhere classified: Secondary | ICD-10-CM | POA: Diagnosis not present

## 2023-01-25 DIAGNOSIS — M25651 Stiffness of right hip, not elsewhere classified: Secondary | ICD-10-CM | POA: Diagnosis not present

## 2023-01-25 DIAGNOSIS — M4807 Spinal stenosis, lumbosacral region: Secondary | ICD-10-CM | POA: Diagnosis not present

## 2023-01-25 DIAGNOSIS — R262 Difficulty in walking, not elsewhere classified: Secondary | ICD-10-CM | POA: Diagnosis not present

## 2023-01-25 DIAGNOSIS — U071 COVID-19: Secondary | ICD-10-CM | POA: Diagnosis not present

## 2023-01-28 DIAGNOSIS — M25652 Stiffness of left hip, not elsewhere classified: Secondary | ICD-10-CM | POA: Diagnosis not present

## 2023-01-28 DIAGNOSIS — U071 COVID-19: Secondary | ICD-10-CM | POA: Diagnosis not present

## 2023-01-28 DIAGNOSIS — R262 Difficulty in walking, not elsewhere classified: Secondary | ICD-10-CM | POA: Diagnosis not present

## 2023-01-28 DIAGNOSIS — M25651 Stiffness of right hip, not elsewhere classified: Secondary | ICD-10-CM | POA: Diagnosis not present

## 2023-01-28 DIAGNOSIS — M4807 Spinal stenosis, lumbosacral region: Secondary | ICD-10-CM | POA: Diagnosis not present

## 2023-01-30 DIAGNOSIS — R262 Difficulty in walking, not elsewhere classified: Secondary | ICD-10-CM | POA: Diagnosis not present

## 2023-01-30 DIAGNOSIS — M4807 Spinal stenosis, lumbosacral region: Secondary | ICD-10-CM | POA: Diagnosis not present

## 2023-01-30 DIAGNOSIS — U071 COVID-19: Secondary | ICD-10-CM | POA: Diagnosis not present

## 2023-01-30 DIAGNOSIS — M25651 Stiffness of right hip, not elsewhere classified: Secondary | ICD-10-CM | POA: Diagnosis not present

## 2023-01-30 DIAGNOSIS — M25652 Stiffness of left hip, not elsewhere classified: Secondary | ICD-10-CM | POA: Diagnosis not present

## 2023-02-01 DIAGNOSIS — M25651 Stiffness of right hip, not elsewhere classified: Secondary | ICD-10-CM | POA: Diagnosis not present

## 2023-02-01 DIAGNOSIS — M4807 Spinal stenosis, lumbosacral region: Secondary | ICD-10-CM | POA: Diagnosis not present

## 2023-02-01 DIAGNOSIS — R262 Difficulty in walking, not elsewhere classified: Secondary | ICD-10-CM | POA: Diagnosis not present

## 2023-02-01 DIAGNOSIS — U071 COVID-19: Secondary | ICD-10-CM | POA: Diagnosis not present

## 2023-02-01 DIAGNOSIS — M25652 Stiffness of left hip, not elsewhere classified: Secondary | ICD-10-CM | POA: Diagnosis not present

## 2023-02-04 DIAGNOSIS — M4807 Spinal stenosis, lumbosacral region: Secondary | ICD-10-CM | POA: Diagnosis not present

## 2023-02-04 DIAGNOSIS — M25652 Stiffness of left hip, not elsewhere classified: Secondary | ICD-10-CM | POA: Diagnosis not present

## 2023-02-04 DIAGNOSIS — U071 COVID-19: Secondary | ICD-10-CM | POA: Diagnosis not present

## 2023-02-04 DIAGNOSIS — R262 Difficulty in walking, not elsewhere classified: Secondary | ICD-10-CM | POA: Diagnosis not present

## 2023-02-04 DIAGNOSIS — M25651 Stiffness of right hip, not elsewhere classified: Secondary | ICD-10-CM | POA: Diagnosis not present

## 2023-02-06 DIAGNOSIS — M25651 Stiffness of right hip, not elsewhere classified: Secondary | ICD-10-CM | POA: Diagnosis not present

## 2023-02-06 DIAGNOSIS — U071 COVID-19: Secondary | ICD-10-CM | POA: Diagnosis not present

## 2023-02-06 DIAGNOSIS — M25652 Stiffness of left hip, not elsewhere classified: Secondary | ICD-10-CM | POA: Diagnosis not present

## 2023-02-06 DIAGNOSIS — R262 Difficulty in walking, not elsewhere classified: Secondary | ICD-10-CM | POA: Diagnosis not present

## 2023-02-06 DIAGNOSIS — M4807 Spinal stenosis, lumbosacral region: Secondary | ICD-10-CM | POA: Diagnosis not present

## 2023-02-08 DIAGNOSIS — M25651 Stiffness of right hip, not elsewhere classified: Secondary | ICD-10-CM | POA: Diagnosis not present

## 2023-02-08 DIAGNOSIS — U071 COVID-19: Secondary | ICD-10-CM | POA: Diagnosis not present

## 2023-02-08 DIAGNOSIS — M4807 Spinal stenosis, lumbosacral region: Secondary | ICD-10-CM | POA: Diagnosis not present

## 2023-02-08 DIAGNOSIS — R262 Difficulty in walking, not elsewhere classified: Secondary | ICD-10-CM | POA: Diagnosis not present

## 2023-02-08 DIAGNOSIS — M25652 Stiffness of left hip, not elsewhere classified: Secondary | ICD-10-CM | POA: Diagnosis not present

## 2023-02-09 ENCOUNTER — Inpatient Hospital Stay (HOSPITAL_COMMUNITY): Payer: Medicare Other | Admitting: Anesthesiology

## 2023-02-09 ENCOUNTER — Encounter (HOSPITAL_COMMUNITY): Payer: Self-pay

## 2023-02-09 ENCOUNTER — Other Ambulatory Visit: Payer: Self-pay

## 2023-02-09 ENCOUNTER — Encounter (HOSPITAL_COMMUNITY): Admission: EM | Disposition: A | Discharge status: Home Health | Payer: Self-pay | Source: Home / Self Care

## 2023-02-09 ENCOUNTER — Emergency Department (HOSPITAL_COMMUNITY): Payer: Medicare Other

## 2023-02-09 ENCOUNTER — Inpatient Hospital Stay (HOSPITAL_COMMUNITY)
Admission: EM | Admit: 2023-02-09 | Discharge: 2023-02-19 | DRG: 328 | Disposition: A | Discharge status: Home Health | Payer: Medicare Other | Attending: Surgery | Admitting: Surgery

## 2023-02-09 DIAGNOSIS — Z8249 Family history of ischemic heart disease and other diseases of the circulatory system: Secondary | ICD-10-CM

## 2023-02-09 DIAGNOSIS — M1990 Unspecified osteoarthritis, unspecified site: Secondary | ICD-10-CM | POA: Diagnosis not present

## 2023-02-09 DIAGNOSIS — I700 Atherosclerosis of aorta: Secondary | ICD-10-CM | POA: Diagnosis not present

## 2023-02-09 DIAGNOSIS — Z79899 Other long term (current) drug therapy: Secondary | ICD-10-CM

## 2023-02-09 DIAGNOSIS — E7800 Pure hypercholesterolemia, unspecified: Secondary | ICD-10-CM | POA: Diagnosis present

## 2023-02-09 DIAGNOSIS — J9811 Atelectasis: Secondary | ICD-10-CM | POA: Diagnosis not present

## 2023-02-09 DIAGNOSIS — E039 Hypothyroidism, unspecified: Secondary | ICD-10-CM

## 2023-02-09 DIAGNOSIS — Z888 Allergy status to other drugs, medicaments and biological substances status: Secondary | ICD-10-CM | POA: Diagnosis not present

## 2023-02-09 DIAGNOSIS — R112 Nausea with vomiting, unspecified: Principal | ICD-10-CM

## 2023-02-09 DIAGNOSIS — K9423 Gastrostomy malfunction: Secondary | ICD-10-CM | POA: Diagnosis not present

## 2023-02-09 DIAGNOSIS — K9429 Other complications of gastrostomy: Secondary | ICD-10-CM | POA: Diagnosis not present

## 2023-02-09 DIAGNOSIS — E876 Hypokalemia: Secondary | ICD-10-CM | POA: Diagnosis not present

## 2023-02-09 DIAGNOSIS — Z79890 Hormone replacement therapy (postmenopausal): Secondary | ICD-10-CM

## 2023-02-09 DIAGNOSIS — E440 Moderate protein-calorie malnutrition: Secondary | ICD-10-CM | POA: Insufficient documentation

## 2023-02-09 DIAGNOSIS — K224 Dyskinesia of esophagus: Secondary | ICD-10-CM | POA: Diagnosis not present

## 2023-02-09 DIAGNOSIS — R079 Chest pain, unspecified: Secondary | ICD-10-CM | POA: Diagnosis not present

## 2023-02-09 DIAGNOSIS — K562 Volvulus: Secondary | ICD-10-CM | POA: Diagnosis not present

## 2023-02-09 DIAGNOSIS — R0789 Other chest pain: Secondary | ICD-10-CM | POA: Diagnosis not present

## 2023-02-09 DIAGNOSIS — K449 Diaphragmatic hernia without obstruction or gangrene: Secondary | ICD-10-CM | POA: Diagnosis not present

## 2023-02-09 DIAGNOSIS — R0902 Hypoxemia: Secondary | ICD-10-CM | POA: Diagnosis not present

## 2023-02-09 DIAGNOSIS — K3189 Other diseases of stomach and duodenum: Secondary | ICD-10-CM | POA: Diagnosis not present

## 2023-02-09 DIAGNOSIS — K5730 Diverticulosis of large intestine without perforation or abscess without bleeding: Secondary | ICD-10-CM | POA: Diagnosis not present

## 2023-02-09 DIAGNOSIS — Z885 Allergy status to narcotic agent status: Secondary | ICD-10-CM

## 2023-02-09 DIAGNOSIS — J90 Pleural effusion, not elsewhere classified: Secondary | ICD-10-CM

## 2023-02-09 DIAGNOSIS — I351 Nonrheumatic aortic (valve) insufficiency: Secondary | ICD-10-CM

## 2023-02-09 DIAGNOSIS — I10 Essential (primary) hypertension: Secondary | ICD-10-CM | POA: Diagnosis not present

## 2023-02-09 DIAGNOSIS — K219 Gastro-esophageal reflux disease without esophagitis: Secondary | ICD-10-CM | POA: Diagnosis not present

## 2023-02-09 DIAGNOSIS — I447 Left bundle-branch block, unspecified: Secondary | ICD-10-CM | POA: Diagnosis not present

## 2023-02-09 HISTORY — PX: UPPER GI ENDOSCOPY: SHX6162

## 2023-02-09 HISTORY — DX: Nausea with vomiting, unspecified: R11.2

## 2023-02-09 HISTORY — DX: Other specified postprocedural states: Z98.890

## 2023-02-09 LAB — CBC
HCT: 41.5 % (ref 36.0–46.0)
Hemoglobin: 12.8 g/dL (ref 12.0–15.0)
MCH: 28.6 pg (ref 26.0–34.0)
MCHC: 30.8 g/dL (ref 30.0–36.0)
MCV: 92.8 fL (ref 80.0–100.0)
Platelets: 611 10*3/uL — ABNORMAL HIGH (ref 150–400)
RBC: 4.47 MIL/uL (ref 3.87–5.11)
RDW: 15.8 % — ABNORMAL HIGH (ref 11.5–15.5)
WBC: 22.9 10*3/uL — ABNORMAL HIGH (ref 4.0–10.5)
nRBC: 0 % (ref 0.0–0.2)

## 2023-02-09 LAB — BASIC METABOLIC PANEL
Anion gap: 15 (ref 5–15)
BUN: 19 mg/dL (ref 8–23)
CO2: 23 mmol/L (ref 22–32)
Calcium: 9.1 mg/dL (ref 8.9–10.3)
Chloride: 103 mmol/L (ref 98–111)
Creatinine, Ser: 0.71 mg/dL (ref 0.44–1.00)
GFR, Estimated: >60 mL/min (ref >60)
Glucose, Bld: 147 mg/dL — ABNORMAL HIGH (ref 70–99)
Potassium: 3.8 mmol/L (ref 3.5–5.1)
Sodium: 141 mmol/L (ref 135–145)

## 2023-02-09 LAB — CBC WITH DIFFERENTIAL/PLATELET
Abs Immature Granulocytes: 0.08 10*3/uL — ABNORMAL HIGH (ref 0.00–0.07)
Basophils Absolute: 0.1 10*3/uL (ref 0.0–0.1)
Basophils Relative: 1 %
Eosinophils Absolute: 0.1 10*3/uL (ref 0.0–0.5)
Eosinophils Relative: 1 %
HCT: 40 % (ref 36.0–46.0)
Hemoglobin: 12.5 g/dL (ref 12.0–15.0)
Immature Granulocytes: 1 %
Lymphocytes Relative: 12 %
Lymphs Abs: 1.7 10*3/uL (ref 0.7–4.0)
MCH: 28.6 pg (ref 26.0–34.0)
MCHC: 31.3 g/dL (ref 30.0–36.0)
MCV: 91.5 fL (ref 80.0–100.0)
Monocytes Absolute: 0.4 10*3/uL (ref 0.1–1.0)
Monocytes Relative: 3 %
Neutro Abs: 12.2 10*3/uL — ABNORMAL HIGH (ref 1.7–7.7)
Neutrophils Relative %: 82 %
Platelets: 553 10*3/uL — ABNORMAL HIGH (ref 150–400)
RBC: 4.37 MIL/uL (ref 3.87–5.11)
RDW: 15.7 % — ABNORMAL HIGH (ref 11.5–15.5)
WBC: 14.7 10*3/uL — ABNORMAL HIGH (ref 4.0–10.5)
nRBC: 0 % (ref 0.0–0.2)

## 2023-02-09 LAB — TROPONIN I (HIGH SENSITIVITY)
Troponin I (High Sensitivity): 16 ng/L (ref <18)
Troponin I (High Sensitivity): 26 ng/L — ABNORMAL HIGH (ref <18)

## 2023-02-09 LAB — TYPE AND SCREEN
ABO/RH(D): A POS
Antibody Screen: NEGATIVE

## 2023-02-09 LAB — CREATININE, SERUM
Creatinine, Ser: 0.75 mg/dL (ref 0.44–1.00)
GFR, Estimated: >60 mL/min (ref >60)

## 2023-02-09 LAB — ABO/RH: ABO/RH(D): A POS

## 2023-02-09 SURGERY — ENDOSCOPY, UPPER GI TRACT
Anesthesia: General

## 2023-02-09 MED ORDER — BUPIVACAINE HCL (PF) 0.25 % IJ SOLN
INTRAMUSCULAR | Status: AC
  Filled 2023-02-09: qty 30

## 2023-02-09 MED ORDER — ONDANSETRON HCL 4 MG/2ML IJ SOLN
INTRAMUSCULAR | Status: AC
  Filled 2023-02-09: qty 2

## 2023-02-09 MED ORDER — PANTOPRAZOLE SODIUM 40 MG IV SOLR
40.0000 mg | Freq: Two times a day (BID) | INTRAVENOUS | Status: DC
  Administered 2023-02-09 – 2023-02-18 (×19): 40 mg via INTRAVENOUS
  Filled 2023-02-09 (×19): qty 10

## 2023-02-09 MED ORDER — FENTANYL CITRATE (PF) 250 MCG/5ML IJ SOLN
INTRAMUSCULAR | Status: DC | PRN
  Administered 2023-02-09 (×4): 50 ug via INTRAVENOUS

## 2023-02-09 MED ORDER — IOHEXOL 350 MG/ML SOLN
100.0000 mL | Freq: Once | INTRAVENOUS | Status: AC | PRN
  Administered 2023-02-09: 100 mL via INTRAVENOUS

## 2023-02-09 MED ORDER — FENTANYL CITRATE (PF) 100 MCG/2ML IJ SOLN
INTRAMUSCULAR | Status: AC
  Filled 2023-02-09: qty 2

## 2023-02-09 MED ORDER — SODIUM CHLORIDE 0.9 % IR SOLN: Status: DC | PRN

## 2023-02-09 MED ORDER — FENTANYL CITRATE PF 50 MCG/ML IJ SOSY
25.0000 ug | PREFILLED_SYRINGE | Freq: Once | INTRAMUSCULAR | Status: AC
  Administered 2023-02-09: 25 ug via INTRAVENOUS
  Filled 2023-02-09: qty 1

## 2023-02-09 MED ORDER — SODIUM CHLORIDE 0.9% IV SOLUTION: Freq: Once | INTRAVENOUS | Status: DC

## 2023-02-09 MED ORDER — DEXAMETHASONE SODIUM PHOSPHATE 10 MG/ML IJ SOLN
INTRAMUSCULAR | Status: AC
  Filled 2023-02-09: qty 1

## 2023-02-09 MED ORDER — ONDANSETRON HCL 4 MG/2ML IJ SOLN
4.0000 mg | Freq: Four times a day (QID) | INTRAMUSCULAR | Status: DC | PRN
  Administered 2023-02-09 – 2023-02-10 (×4): 4 mg via INTRAVENOUS
  Filled 2023-02-09 (×5): qty 2

## 2023-02-09 MED ORDER — ALBUMIN HUMAN 5 % IV SOLN: INTRAVENOUS | Status: DC | PRN

## 2023-02-09 MED ORDER — METHOCARBAMOL 1000 MG/10ML IJ SOLN: 500.0000 mg | Freq: Four times a day (QID) | INTRAVENOUS | Status: DC | PRN

## 2023-02-09 MED ORDER — ACETAMINOPHEN 10 MG/ML IV SOLN
INTRAVENOUS | Status: AC
  Filled 2023-02-09: qty 100

## 2023-02-09 MED ORDER — VASOPRESSIN 20 UNIT/ML IV SOLN
INTRAVENOUS | Status: DC | PRN
  Administered 2023-02-09: 1 [IU] via INTRAVENOUS

## 2023-02-09 MED ORDER — LACTATED RINGERS IV SOLN: INTRAVENOUS | Status: DC | PRN

## 2023-02-09 MED ORDER — FENTANYL CITRATE (PF) 250 MCG/5ML IJ SOLN
INTRAMUSCULAR | Status: AC
  Filled 2023-02-09: qty 5

## 2023-02-09 MED ORDER — ROCURONIUM BROMIDE 10 MG/ML (PF) SYRINGE
PREFILLED_SYRINGE | INTRAVENOUS | Status: AC
  Filled 2023-02-09: qty 10

## 2023-02-09 MED ORDER — SUCCINYLCHOLINE CHLORIDE 200 MG/10ML IV SOSY
PREFILLED_SYRINGE | INTRAVENOUS | Status: DC | PRN
  Administered 2023-02-09: 100 mg via INTRAVENOUS

## 2023-02-09 MED ORDER — PROPOFOL 10 MG/ML IV BOLUS
INTRAVENOUS | Status: AC
  Filled 2023-02-09: qty 20

## 2023-02-09 MED ORDER — 0.9 % SODIUM CHLORIDE (POUR BTL) OPTIME
TOPICAL | Status: DC | PRN
  Administered 2023-02-09: 1000 mL

## 2023-02-09 MED ORDER — PHENYLEPHRINE HCL-NACL 20-0.9 MG/250ML-% IV SOLN
INTRAVENOUS | Status: DC | PRN
  Administered 2023-02-09: 50 ug/min via INTRAVENOUS

## 2023-02-09 MED ORDER — ONDANSETRON HCL 4 MG/2ML IJ SOLN
INTRAMUSCULAR | Status: DC | PRN
  Administered 2023-02-09: 4 mg via INTRAVENOUS

## 2023-02-09 MED ORDER — METRONIDAZOLE 500 MG/100ML IV SOLN
500.0000 mg | Freq: Once | INTRAVENOUS | Status: AC
  Administered 2023-02-09: 500 mg via INTRAVENOUS

## 2023-02-09 MED ORDER — FENTANYL CITRATE (PF) 100 MCG/2ML IJ SOLN
25.0000 ug | INTRAMUSCULAR | Status: DC | PRN
  Administered 2023-02-09: 25 ug via INTRAVENOUS

## 2023-02-09 MED ORDER — GABAPENTIN 600 MG PO TABS: 300.0000 mg | ORAL_TABLET | Freq: Three times a day (TID) | ORAL | Status: DC

## 2023-02-09 MED ORDER — PROPOFOL 10 MG/ML IV BOLUS
INTRAVENOUS | Status: DC | PRN
  Administered 2023-02-09: 120 mg via INTRAVENOUS

## 2023-02-09 MED ORDER — PHENYLEPHRINE 80 MCG/ML (10ML) SYRINGE FOR IV PUSH (FOR BLOOD PRESSURE SUPPORT)
PREFILLED_SYRINGE | INTRAVENOUS | Status: AC
  Filled 2023-02-09: qty 10

## 2023-02-09 MED ORDER — ENOXAPARIN SODIUM 40 MG/0.4ML IJ SOSY
40.0000 mg | PREFILLED_SYRINGE | INTRAMUSCULAR | Status: DC
  Administered 2023-02-09 – 2023-02-14 (×6): 40 mg via SUBCUTANEOUS
  Filled 2023-02-09 (×6): qty 0.4

## 2023-02-09 MED ORDER — HYDROMORPHONE HCL 1 MG/ML IJ SOLN
0.5000 mg | INTRAMUSCULAR | Status: DC | PRN
  Administered 2023-02-09 – 2023-02-16 (×24): 0.5 mg via INTRAVENOUS
  Filled 2023-02-09 (×21): qty 0.5
  Filled 2023-02-09: qty 1
  Filled 2023-02-09 (×4): qty 0.5

## 2023-02-09 MED ORDER — ONDANSETRON HCL 4 MG/2ML IJ SOLN
4.0000 mg | Freq: Once | INTRAMUSCULAR | Status: AC
  Administered 2023-02-09: 4 mg via INTRAVENOUS
  Filled 2023-02-09: qty 2

## 2023-02-09 MED ORDER — SIMETHICONE 80 MG PO CHEW: 80.0000 mg | CHEWABLE_TABLET | Freq: Four times a day (QID) | ORAL | Status: DC | PRN

## 2023-02-09 MED ORDER — FAMOTIDINE IN NACL 20-0.9 MG/50ML-% IV SOLN
20.0000 mg | Freq: Once | INTRAVENOUS | Status: AC
  Administered 2023-02-09: 20 mg via INTRAVENOUS
  Filled 2023-02-09: qty 50

## 2023-02-09 MED ORDER — SUCCINYLCHOLINE CHLORIDE 200 MG/10ML IV SOSY
PREFILLED_SYRINGE | INTRAVENOUS | Status: AC
  Filled 2023-02-09: qty 10

## 2023-02-09 MED ORDER — SODIUM CHLORIDE 0.9 % IV SOLN
2.0000 g | Freq: Once | INTRAVENOUS | Status: AC
  Administered 2023-02-09: 2 g via INTRAVENOUS
  Filled 2023-02-09: qty 12.5

## 2023-02-09 MED ORDER — ROCURONIUM BROMIDE 10 MG/ML (PF) SYRINGE
PREFILLED_SYRINGE | INTRAVENOUS | Status: DC | PRN
  Administered 2023-02-09: 20 mg via INTRAVENOUS
  Administered 2023-02-09: 45 mg via INTRAVENOUS

## 2023-02-09 MED ORDER — OXYCODONE HCL 5 MG PO TABS: 5.0000 mg | ORAL_TABLET | Freq: Once | ORAL | Status: DC | PRN

## 2023-02-09 MED ORDER — VASOPRESSIN 20 UNIT/ML IV SOLN
INTRAVENOUS | Status: AC
  Filled 2023-02-09: qty 1

## 2023-02-09 MED ORDER — ACETAMINOPHEN 325 MG PO TABS
650.0000 mg | ORAL_TABLET | Freq: Four times a day (QID) | ORAL | Status: DC
  Administered 2023-02-15 – 2023-02-19 (×14): 650 mg via ORAL
  Filled 2023-02-09 (×17): qty 2

## 2023-02-09 MED ORDER — ACETAMINOPHEN 10 MG/ML IV SOLN
1000.0000 mg | Freq: Once | INTRAVENOUS | Status: DC | PRN
  Administered 2023-02-09: 1000 mg via INTRAVENOUS

## 2023-02-09 MED ORDER — SUGAMMADEX SODIUM 200 MG/2ML IV SOLN
INTRAVENOUS | Status: DC | PRN
  Administered 2023-02-09: 200 mg via INTRAVENOUS

## 2023-02-09 MED ORDER — BUPIVACAINE HCL 0.25 % IJ SOLN
INTRAMUSCULAR | Status: DC | PRN
  Administered 2023-02-09: 30 mL

## 2023-02-09 MED ORDER — METRONIDAZOLE 500 MG/100ML IV SOLN
INTRAVENOUS | Status: AC
  Filled 2023-02-09: qty 100

## 2023-02-09 MED ORDER — LIDOCAINE 2% (20 MG/ML) 5 ML SYRINGE
INTRAMUSCULAR | Status: AC
  Filled 2023-02-09: qty 5

## 2023-02-09 MED ORDER — ACETAMINOPHEN 500 MG PO TABS: 1000.0000 mg | ORAL_TABLET | Freq: Once | ORAL | Status: DC | PRN

## 2023-02-09 MED ORDER — OXYCODONE HCL 5 MG/5ML PO SOLN: 5.0000 mg | Freq: Once | ORAL | Status: DC | PRN

## 2023-02-09 MED ORDER — ACETAMINOPHEN 160 MG/5ML PO SOLN: 1000.0000 mg | Freq: Once | ORAL | Status: DC | PRN

## 2023-02-09 MED ORDER — LIDOCAINE 2% (20 MG/ML) 5 ML SYRINGE
INTRAMUSCULAR | Status: DC | PRN
  Administered 2023-02-09: 60 mg via INTRAVENOUS

## 2023-02-09 MED ORDER — LACTATED RINGERS IV SOLN: INTRAVENOUS | Status: DC

## 2023-02-09 MED ORDER — LEVOTHYROXINE SODIUM 50 MCG PO TABS
50.0000 ug | ORAL_TABLET | Freq: Every day | ORAL | Status: DC
  Administered 2023-02-15 – 2023-02-19 (×4): 50 ug via ORAL
  Filled 2023-02-09 (×4): qty 1

## 2023-02-09 MED ORDER — DEXAMETHASONE SODIUM PHOSPHATE 10 MG/ML IJ SOLN
INTRAMUSCULAR | Status: DC | PRN
  Administered 2023-02-09: 8 mg via INTRAVENOUS

## 2023-02-09 MED ORDER — PHENYLEPHRINE 80 MCG/ML (10ML) SYRINGE FOR IV PUSH (FOR BLOOD PRESSURE SUPPORT)
PREFILLED_SYRINGE | INTRAVENOUS | Status: DC | PRN
  Administered 2023-02-09: 160 ug via INTRAVENOUS
  Administered 2023-02-09: 240 ug via INTRAVENOUS
  Administered 2023-02-09: 400 ug via INTRAVENOUS

## 2023-02-09 MED ORDER — PROCHLORPERAZINE EDISYLATE 10 MG/2ML IJ SOLN
10.0000 mg | INTRAMUSCULAR | Status: DC | PRN
  Administered 2023-02-09 – 2023-02-11 (×3): 10 mg via INTRAVENOUS
  Filled 2023-02-09 (×3): qty 2

## 2023-02-09 SURGICAL SUPPLY — 42 items
ADH SKN CLS APL DERMABOND .7 (GAUZE/BANDAGES/DRESSINGS) ×1
APL PRP STRL LF DISP 70% ISPRP (MISCELLANEOUS) ×1
BAG COUNTER SPONGE SURGICOUNT (BAG) ×2 IMPLANT
BAG SPNG CNTER NS LX DISP (BAG) ×1
BIOPATCH RED 1 DISK 7.0 (GAUZE/BANDAGES/DRESSINGS) IMPLANT
CANISTER SUCT 3000ML PPV (MISCELLANEOUS) IMPLANT
CHLORAPREP W/TINT 26 (MISCELLANEOUS) ×2 IMPLANT
COVER SURGICAL LIGHT HANDLE (MISCELLANEOUS) ×2 IMPLANT
DERMABOND ADVANCED .7 DNX12 (GAUZE/BANDAGES/DRESSINGS) ×2 IMPLANT
DRAIN CHANNEL 19F RND (DRAIN) IMPLANT
DRSG TEGADERM 2-3/8X2-3/4 SM (GAUZE/BANDAGES/DRESSINGS) IMPLANT
ELECT REM PT RETURN 9FT ADLT (ELECTROSURGICAL) ×1
ELECTRODE REM PT RTRN 9FT ADLT (ELECTROSURGICAL) ×2 IMPLANT
EVACUATOR SILICONE 100CC (DRAIN) IMPLANT
GAUZE SPONGE 4X4 12PLY STRL (GAUZE/BANDAGES/DRESSINGS) IMPLANT
GLOVE BIO SURGEON STRL SZ7.5 (GLOVE) ×2 IMPLANT
GLOVE BIOGEL PI IND STRL 8 (GLOVE) ×2 IMPLANT
GOWN STRL REUS W/ TWL XL LVL3 (GOWN DISPOSABLE) ×2 IMPLANT
GOWN STRL REUS W/TWL XL LVL3 (GOWN DISPOSABLE) ×2
GRASPER SUT TROCAR 14GX15 (MISCELLANEOUS) IMPLANT
IRRIG SUCT STRYKERFLOW 2 WTIP (MISCELLANEOUS) ×1
IRRIGATION SUCT STRKRFLW 2 WTP (MISCELLANEOUS) IMPLANT
KIT BASIN OR (CUSTOM PROCEDURE TRAY) ×2 IMPLANT
KIT TURNOVER KIT B (KITS) ×1 IMPLANT
NDL 22X1.5 STRL (OR ONLY) (MISCELLANEOUS) ×2 IMPLANT
NDL INSUFFLATION 14GA 120MM (NEEDLE) ×1 IMPLANT
NEEDLE 22X1.5 STRL (OR ONLY) (MISCELLANEOUS) ×1 IMPLANT
NEEDLE INSUFFLATION 14GA 120MM (NEEDLE) ×1 IMPLANT
NS IRRIG 1000ML POUR BTL (IV SOLUTION) ×2 IMPLANT
PAD ARMBOARD 7.5X6 YLW CONV (MISCELLANEOUS) ×4 IMPLANT
SCISSORS LAP 5X35 DISP (ENDOMECHANICALS) IMPLANT
SET TUBE SMOKE EVAC HIGH FLOW (TUBING) ×1 IMPLANT
SHEARS HARMONIC ACE PLUS 36CM (ENDOMECHANICALS) IMPLANT
SLEEVE Z-THREAD 5X100MM (TROCAR) ×2 IMPLANT
SUT ETHILON 2 0 FS 18 (SUTURE) IMPLANT
SUT MNCRL AB 4-0 PS2 18 (SUTURE) ×1 IMPLANT
SUT SILK 0 SH 30 (SUTURE) IMPLANT
TOWEL GREEN STERILE (TOWEL DISPOSABLE) ×2 IMPLANT
TRAY LAPAROSCOPIC MC (CUSTOM PROCEDURE TRAY) ×2 IMPLANT
TROCAR Z-THREAD OPTICAL 5X100M (TROCAR) IMPLANT
TUBE GASTRO BOLUS 20FR ENFIT (TUBING) IMPLANT
WARMER LAPAROSCOPE (MISCELLANEOUS) ×1 IMPLANT

## 2023-02-09 NOTE — Progress Notes (Signed)
Unfortunately, despite multiple attempts with mutliple sized tubes bedside, NG tube hangs up at around 45 cm and does not decompress stomach.  Discussed with patient and patient's son Trip (over the phone).  I recommend upper endoscopy with decompression of gastric volvulus and laparoscopic gastrostomy tube placement.  We discussed the procedure, its risks, benefits and alteratives and the patient granted consent to proceed.  We will proceed emergently to the OR tonight.  Felicie Morn, MD General, Bariatric and Minimally Invasive Surgery [facility]

## 2023-02-09 NOTE — ED Triage Notes (Signed)
BIB EMS Cx pain 30 mins prior to EMS being called. Chest pain radiates to back and left arm. 10/10 pain. EMS states upon arrival pt was cold/pale/vomiting. Pt sts she had hiatal hernia in the past and this is same symptoms.  EMS gave 324 aspirin, 0.4 nitro, 4mg  zofran, 4mg  morphine without relief.  EKG showed LBBB.,

## 2023-02-09 NOTE — Op Note (Signed)
Patient: Monica White ([birth year], [id number])  Date of Surgery: 02/09/2023   Preoperative Diagnosis: GASTRIC VOLVULUS   Postoperative Diagnosis: GASTRIC VOLVULUS   Surgical Procedure: UPPER GI ENDOSCOPY; LAPAROSCOPIC GASTROSTOMY TUBE PLACEMENT: [id number]   Operative Team Members:  Surgeon(s) and Role:    * Kamrin Sibley, Nickola Major, MD - Primary   Anesthesiologist: Santa Lighter, MD CRNA: Inda Coke, CRNA   Anesthesia: General   Fluids:  Total I/O In: [id number] [I.V.:1400; IV Piggyback:250] Out: 66 [id number]  Complications: none  Drains:   Left lateral abdominal 19 Fr JP drain traverses the hiatus to drain the chest Left medial abdominal 19 Fr JP drain sits in the left upper quadrant to drain the intraabdominal subdiaphragmatic space 20 Fr Gastrostomy tube  Specimen: none  Disposition:  PACU - hemodynamically stable.  Plan of Care: Admit to inpatient     Indications for Procedure: Monica White is a 82 y.o. female who presented with a recurrent hiatal hernia with gastric volvulus.  I recommended emergent upper endoscopy with laparoscopic gastrostomy tube placement.  I explained I would not attempt to repair the hiatal hernia emergently.  The procedure itself as well as its risks, benefits and alternatives were discussed.  The risks discussed included but were not limited to the risk of infection, bleeding, damage to nearby structures.  After a full discussion and all questions answered the patient granted consent to proceed.  Findings: Massive hiatal hernia with massive dilation of the stomach and some ischemic changes noted on upper endoscopy.  GE Junction:  Gastrostomy tube:   Ischemic changes in body of stomach:     Description of Procedure:   On the date stated above the patient taken operating room suite and placed in supine position.  General endotracheal anesthesia was induced.  A timeout was completed verifying the correct patient, procedure, position, and  equipment needed for the case.  The patient's abdomen was prepped and draped in usual sterile fashion.  I obtained an Olympus adult upper endoscope and inserted into the mouth and down the esophagus.  The esophagus was severely tortuous.  I was unable to pass the scope into the stomach.  I then proceeded with the laparoscopic portion of the case.  I used three 5 mm incisions across the mid abdomen and inflated the abdomen to 15 mmHg.  A liver retractor was placed.  The stomach was evaluated.  The previous gastrostomy tube pexy site was stretched out.  These adhesions were divided with the harmonic scalpel.  A Nathanson liver retractor was placed in the subxiphoid position to retract the left lobe of the liver.  I was able to pull the inferior portion of the stomach down into the abdomen.  In doing this a perforation was created just above the area of the previous gastrostomy tube site.  I decided to use this as my gastrostomy tube site.  I placed the suction irrigator through this hole and decompressed almost a liter of fluid out of the stomach.  This allowed additional stomach to be delivered out of the chest.  A 0 silk suture was used to place a pursestring around this hole.  A 20 French surgical G-tube was brought through the abdominal wall and inserted through the hole.  The balloon was tested intra-abdominal he and held fluid.  The tip of the tube was placed in the stomach and the pursestring was tied down around the tube.  The balloon was then inflated.  Due to the condition of the stomach, I  placed another pursestring using 0 silk suture around the original pursestring to further imbricate this tube into the stomach.  This was tied down.  Four transfascial 0 silk sutures were placed taking bites of the anterior gastric wall in each quadrant around the G-tube.  These were tied down externally to pexied the stomach to the abdominal wall.    The adult upper endoscope was then advanced.  I was able to  advance into the stomach this time.  There were ischemic changes to the greater curve of the stomach and the portion of stomach still herniated into the chest.  The stomach was full of fluid and food debris.  The scope was able to be passed through the pylorus into the duodenum now the stomach had been reduced some out of the chest and decompressed.  All air and as much fluid as possible was sucked out of the stomach and the endoscope was removed.  Two 72 French JP drains were brought in through the left abdominal wall and the left most port site.  The more lateral drain was placed through the hiatus into the chest.  The more medial drain was placed in the subdiaphragmatic intra-abdominal space.  These were fixed to the skin using nylon suture.  The G-tube was fixed to the skin using nylon suture.  The bumper was pushed down over some gauze bandages.  The abdomen was desufflated.  The 5 mm trocar sites in the liver retractor site was closed using 4-0 Monocryl and Dermabond.  All sponge needle counts were correct at the end of this case.  At the end of the case we reviewed the infection status of the case. Patient: Monica White Emergency General Surgery Service Patient Case: Emergent Infection Present At Time Of Surgery (PATOS):  Spillage of gastric contents into the abdomen and chest related to creating a gastrostomy tube in a patient with a hiatal hernia  Louanna Raw, MD General, Bariatric, & Minimally Invasive Surgery [facility], [place]

## 2023-02-09 NOTE — Plan of Care (Signed)

## 2023-02-09 NOTE — ED Notes (Signed)
Monica White (son) would like to be on phone when provider speaks with patient.

## 2023-02-09 NOTE — Anesthesia Postprocedure Evaluation (Signed)
Anesthesia Post Note  Patient: Monica White  Procedure(s) Performed: UPPER GI ENDOSCOPY; DECOMPRESSION OF GASTRIC VOLVULUS; LAPAROSCOPIC GASTROSTOMY TUBE PLACEMENT     Patient location during evaluation: PACU Anesthesia Type: General Level of consciousness: awake and alert Pain management: pain level controlled Vital Signs Assessment: post-procedure vital signs reviewed and stable Respiratory status: spontaneous breathing, nonlabored ventilation, respiratory function stable and patient connected to nasal cannula oxygen Cardiovascular status: blood pressure returned to baseline and stable Postop Assessment: no apparent nausea or vomiting Anesthetic complications: no   No notable events documented.  Last Vitals:  Vitals:   02/09/23 1251 02/09/23 1537  BP: 132/80 121/67  Pulse: 78 79  Resp: 18 18  Temp: (!) 36.3 C 36.5 C  SpO2: 94% 93%    Last Pain:  Vitals:   02/09/23 1637  TempSrc:   PainSc: [place]

## 2023-02-09 NOTE — H&P (Signed)
Admitting Physician: Nickola Major Jaelyn Bourgoin  Service: General Surgery  CC: Chest pain, vomiting, hiatal hernia symptoms  Subjective   HPI: Monica White is an 82 y.o. female who is here for chest pain, vomiting and hiatal hernia symptoms.  Monica White underwent laparoscopic paraesophageal hernai repair with Toupet fundoplication on 0000000 with Dr. Kieth Brightly.  She presented to the emergency room on 11/02/22 with a recurrent Type IV hiatal hernia including stomach, duodenum, transverse colon and omentum.  She returned to the operating room on 11/06/22, and underwent robotic reduction of hiatal hernai with laparoscopic G tube insertion.  Due to dense scar tissue from the recent hernia repair, the stomach was unable to be fully reduced, and the hiatus was unable to be formally repaired. Unfortunately, the G tube was dislodged on 11/12/22, when the patient got up to use the bathroom in the middle of the night.  Today, she presents to the ER with recurrent symptoms of chest pain, vomiting, similar to previous hiatal hernia symptoms.  Past Medical History:  Diagnosis Date   Anemia    Arthritis    Dyspnea    GERD (gastroesophageal reflux disease)    History of hiatal hernia    Hypercholesteremia    Hypothyroid 2015    Past Surgical History:  Procedure Laterality Date   COLONOSCOPY     HIATAL HERNIA REPAIR N/A 03/12/2022   Procedure: LAPAROSCOPIC REPAIR OF HIATAL HERNIA;  Surgeon: Kieth Brightly Arta Bruce, MD;  Location: [facility];  Service: General;  Laterality: N/A;   RIGHT HEART CATH AND CORONARY ANGIOGRAPHY N/A 12/01/2021   Procedure: RIGHT HEART CATH AND CORONARY ANGIOGRAPHY;  Surgeon: Sherren Mocha, MD;  Location: [facility];  Service: Cardiovascular;  Laterality: N/A;   TUBAL LIGATION  11/27/1983   UPPER GASTROINTESTINAL ENDOSCOPY     XI ROBOTIC ASSISTED HIATAL HERNIA REPAIR N/A 11/06/2022   Procedure: XI ROBOTIC REDUCTIONOF INCARCERATED HIATAL HERNIA REPAIR, LAPAROSCOPIC G-TUBE  INSERTION, EGD;  Surgeon: Kinsinger, Arta Bruce, MD;  Location: [facility];  Service: General;  Laterality: N/A;    Family History  Problem Relation Age of Onset   Heart disease Mother     Social:  reports that she has never smoked. She has never used smokeless tobacco. She reports that she does not currently use alcohol. She reports that she does not use drugs.  Allergies:  Allergies  Allergen Reactions   Atorvastatin Other (See Comments)    Stiffness   Escitalopram Other (See Comments)    Stiffness   Pitavastatin Other (See Comments)    Stiffness   Tramadol Other (See Comments)    "Felt bad and headache"    Medications: Current Outpatient Medications  Medication Instructions   Ascorbic Acid (VITAMIN C PO) 1 tablet, Oral, Daily   B-12 1,000 mcg, Oral, Daily   cholecalciferol (VITAMIN D3) 1,000 Units, Oral, Daily   esomeprazole (NEXIUM) 20 mg, Oral, Daily before breakfast   estradiol (ESTRACE) 0.5 mg, Oral, Daily   levothyroxine (SYNTHROID) 50 mcg, Oral, Daily   medroxyPROGESTERone (PROVERA) 2.5 mg, Oral, Daily   Multiple Vitamins-Minerals (MULTIVITAMIN PO) 1 tablet, Oral, Daily   ondansetron (ZOFRAN-ODT) 4 mg, Oral, Every 6 hours PRN   Probiotic Product (PROBIOTIC PO) 1 capsule, Oral, Daily, Align    ROS - all of the below systems have been reviewed with the patient and positives are indicated with bold text General: chills, fever or night sweats Eyes: blurry vision or double vision ENT: epistaxis or sore throat Allergy/Immunology: itchy/watery eyes or nasal congestion Hematologic/Lymphatic:  bleeding problems, blood clots or swollen lymph nodes Endocrine: temperature intolerance or unexpected weight changes Breast: new or changing breast lumps or nipple discharge Resp: cough, shortness of breath, or wheezing CV: chest pain or dyspnea on exertion GI: as per HPI GU: dysuria, trouble voiding, or hematuria MSK: joint pain or joint stiffness Neuro: TIA or stroke  symptoms Derm: pruritus and skin lesion changes Psych: anxiety and depression  Objective   PE Blood pressure (!) 149/90, pulse 86, temperature 97.6 F (36.4 C), temperature source Oral, resp. rate (!) 21, height 5\' 7"  (1.702 m), weight 59 kg, last menstrual period 01/13/1994, SpO2 93 %. Constitutional: NAD; conversant; no deformities Eyes: Moist conjunctiva; no lid lag; anicteric; PERRL Neck: Trachea midline; no thyromegaly Lungs: Normal respiratory effort; no tactile fremitus CV: RRR; no palpable thrills; no pitting edema GI: Abd soft, nontender; no palpable hepatosplenomegaly MSK: Normal range of motion of extremities; no clubbing/cyanosis Psychiatric: Appropriate affect; alert and oriented x3 Lymphatic: No palpable cervical or axillary lymphadenopathy  Results for orders placed or performed during the hospital encounter of 02/09/23 (from the past 24 hour(s))  CBC with Differential     Status: Abnormal   Collection Time: 02/09/23  1:02 AM  Result Value Ref Range   WBC 14.7 (H) 4.0 - 10.5 K/uL   RBC 4.37 3.87 - 5.11 MIL/uL   Hemoglobin 12.5 12.0 - 15.0 g/dL   HCT 40.0 36.0 - 46.0 %   MCV 91.5 80.0 - 100.0 fL   MCH 28.6 26.0 - 34.0 pg   MCHC 31.3 30.0 - 36.0 g/dL   RDW 15.7 (H) 11.5 - 15.5 %   Platelets 553 (H) 150 - 400 K/uL   nRBC 0.0 0.0 - 0.2 %   Neutrophils Relative % 82 %   Neutro Abs 12.2 (H) 1.7 - 7.7 K/uL   Lymphocytes Relative 12 %   Lymphs Abs 1.7 0.7 - 4.0 K/uL   Monocytes Relative 3 %   Monocytes Absolute 0.4 0.1 - 1.0 K/uL   Eosinophils Relative 1 %   Eosinophils Absolute 0.1 0.0 - 0.5 K/uL   Basophils Relative 1 %   Basophils Absolute 0.1 0.0 - 0.1 K/uL   Immature Granulocytes 1 %   Abs Immature Granulocytes 0.08 (H) 0.00 - 0.07 K/uL  Basic metabolic panel     Status: Abnormal   Collection Time: 02/09/23  1:02 AM  Result Value Ref Range   Sodium 141 135 - 145 mmol/L   Potassium 3.8 3.5 - 5.1 mmol/L   Chloride 103 98 - 111 mmol/L   CO2 23 22 - 32  mmol/L   Glucose, Bld 147 (H) 70 - 99 mg/dL   BUN 19 8 - 23 mg/dL   Creatinine, Ser 0.71 0.44 - 1.00 mg/dL   Calcium 9.1 8.9 - 10.3 mg/dL   GFR, Estimated >60 >60 mL/min   Anion gap 15 5 - 15  Troponin I (High Sensitivity)     Status: None   Collection Time: 02/09/23  1:02 AM  Result Value Ref Range   Troponin I (High Sensitivity) 16 <18 ng/L  Troponin I (High Sensitivity)     Status: Abnormal   Collection Time: 02/09/23  2:38 AM  Result Value Ref Range   Troponin I (High Sensitivity) 26 (H) <18 ng/L     Imaging Orders         DG Chest Portable 1 View         CT Angio Chest/Abd/Pel for Dissection W and/or Wo Contrast  1. Aortic atherosclerosis without evidence of aneurysm or dissection. 2. 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 3. Large hiatal hernia with intrathoracic stomach. The stomach is markedly distended with air and debris, volvulus can not be completely excluded. Surgical consultation is suggested. 4. Small left pleural effusion with atelectasis or infiltrate. 5. Remaining incidental findings as described above.   Assessment and Plan   Monica White is an 82 y.o. female with gastric volvulus related to recurrent hiatal hernia.  I recommend NG tube placement.  Hopefully, we are able to decompress the stomach with NG.  Then, she probably would benefit from replacement of gastrostomy tube laparoscopically.  Due to the superior extent of the hiatal hernia during her most recent operation in 10/2022, Dr. Kieth Brightly felt a thoracic surgery consult would be useful prior to repeated attempt at formal repair.  It would also make sense to wait 6-12 months, if possible, after her previous surgery prior to repeat attempt at formal hiatal hernia repair.  First step is to see how she responds to nasogastric tube decompression.  Additional recommendations to follow.  Felicie Morn, MD  [facility] Use [email or web address] to contact on call provider  New Patient Billing: [phone] - High MDM

## 2023-02-09 NOTE — Transfer of Care (Signed)
Immediate Anesthesia Transfer of Care Note  Patient: Monica White  Procedure(s) Performed: UPPER GI ENDOSCOPY; DECOMPRESSION OF GASTRIC VOLVULUS; LAPAROSCOPIC GASTROSTOMY TUBE PLACEMENT  Patient Location: PACU  Anesthesia Type:General  Level of Consciousness: awake, alert , and oriented  Airway & Oxygen Therapy: Patient Spontanous Breathing and Patient connected to nasal cannula oxygen  Post-op Assessment: Report given to RN and Post -op Vital signs reviewed and stable  Post vital signs: Reviewed and stable  Last Vitals:  Vitals Value Taken Time  BP 115/72 02/09/23 1022  Temp 36.2 C 02/09/23 1022  Pulse 84 02/09/23 1028  Resp 18 02/09/23 1028  SpO2 91 % 02/09/23 1028  Vitals shown include unvalidated device data.  Last Pain:  Vitals:   02/09/23 0715  TempSrc: Oral  PainSc: 4       Patients Stated Pain Goal: 2 ([id number])  Complications: No notable events documented.

## 2023-02-09 NOTE — ED Provider Notes (Signed)
[facility] Provider Note   CSN: [id number] Arrival date & time: 02/09/23  0033     History  Chief Complaint  Patient presents with   Chest Pain   Emesis    Monica White is a 82 y.o. female.  The history is provided by the patient.  Chest Pain Pain location:  Substernal area Pain quality: dull   Pain radiates to:  Upper back Pain severity:  Severe Onset quality:  Sudden Timing:  Constant Progression:  Unchanged Chronicity:  New Context: at rest   Context: not breathing   Relieved by:  Nothing Worsened by:  Nothing Ineffective treatments:  None tried Associated symptoms: vomiting   Risk factors: no aortic disease and not female   Risk factors comment:  Hiatal hernia surgery Emesis Patient who is s/p hiatal hernia surgery presents with chest pain that radiates to the back that started again tonight.  Stated this feels like previous episode with her hernia.  Pain medication and nausea medication en route was not effective.       Home Medications Prior to Admission medications   Medication Sig Start Date End Date Taking? Authorizing Provider  Ascorbic Acid (VITAMIN C PO) Take 1 tablet by mouth daily.    [provider]  cholecalciferol (VITAMIN D3) 25 MCG (1000 UNIT) tablet Take 1,000 Units by mouth daily.    [provider]  Cyanocobalamin (B-12) 1000 MCG CAPS Take 1,000 mcg by mouth daily.    [provider]  esomeprazole (NEXIUM) 20 MG packet Take 20 mg by mouth daily before breakfast. 11/02/22   Kinsinger, Arta Bruce, MD  estradiol (ESTRACE) 0.5 MG tablet Take 0.5 mg by mouth daily.    [provider]  levothyroxine (SYNTHROID, LEVOTHROID) 50 MCG tablet Take 50 mcg by mouth daily. 02/27/15   [provider]  medroxyPROGESTERone (PROVERA) 2.5 MG tablet Take 1 tablet (2.5 mg total) by mouth daily. 03/04/15   Nunzio Cobbs, MD  Multiple Vitamins-Minerals (MULTIVITAMIN PO)  Take 1 tablet by mouth daily.    [provider]  ondansetron (ZOFRAN-ODT) 4 MG disintegrating tablet Take 1 tablet (4 mg total) by mouth every 6 (six) hours as needed for nausea. 11/09/22   Kinsinger, Arta Bruce, MD  Probiotic Product (PROBIOTIC PO) Take 1 capsule by mouth daily. Pensions consultant, Historical, MD      Allergies    Atorvastatin, Escitalopram, Pitavastatin, and Tramadol    Review of Systems   Review of Systems  Cardiovascular:  Positive for chest pain.  Gastrointestinal:  Positive for vomiting.    Physical Exam Updated Vital Signs BP (!) 149/90   Pulse 86   Temp 97.6 F (36.4 C) (Oral)   Resp (!) 21   Ht 5\' 7"  (1.702 m)   Wt 59 kg   LMP 01/13/1994   SpO2 93%   BMI 20.36 kg/m  Physical Exam Vitals and nursing note reviewed.  Constitutional:      General: She is not in acute distress.    Appearance: Normal appearance. She is well-developed.  HENT:     Head: Normocephalic and atraumatic.     Nose: Nose normal.  Eyes:     Pupils: Pupils are equal, round, and reactive to light.  Cardiovascular:     Rate and Rhythm: Normal rate and regular rhythm.     Pulses: Normal pulses.     Heart sounds: Normal heart sounds.  Pulmonary:     Effort:  Pulmonary effort is normal. No respiratory distress.     Breath sounds: Examination of the left-upper field reveals decreased breath sounds. Examination of the left-middle field reveals decreased breath sounds. Examination of the left-lower field reveals decreased breath sounds. Decreased breath sounds present.  Abdominal:     General: Bowel sounds are normal. There is no distension.     Palpations: Abdomen is soft.     Tenderness: There is no abdominal tenderness. There is no guarding or rebound.  Genitourinary:    Vagina: No vaginal discharge.  Musculoskeletal:        General: Normal range of motion.     Cervical back: Neck supple.  Skin:    General: Skin is dry.     Capillary Refill: Capillary refill takes  less than 2 seconds.     Findings: No erythema or rash.  Neurological:     General: No focal deficit present.     Mental Status: She is alert and oriented to person, place, and time.     Deep Tendon Reflexes: Reflexes normal.  Psychiatric:        Mood and Affect: Mood normal.     ED Results / Procedures / Treatments   Labs (all labs ordered are listed, but only abnormal results are displayed) Results for orders placed or performed during the hospital encounter of 02/09/23  CBC with Differential  Result Value Ref Range   WBC 14.7 (H) 4.0 - 10.5 K/uL   RBC 4.37 3.87 - 5.11 MIL/uL   Hemoglobin 12.5 12.0 - 15.0 g/dL   HCT 40.0 36.0 - 46.0 %   MCV 91.5 80.0 - 100.0 fL   MCH 28.6 26.0 - 34.0 pg   MCHC 31.3 30.0 - 36.0 g/dL   RDW 15.7 (H) 11.5 - 15.5 %   Platelets 553 (H) 150 - 400 K/uL   nRBC 0.0 0.0 - 0.2 %   Neutrophils Relative % 82 %   Neutro Abs 12.2 (H) 1.7 - 7.7 K/uL   Lymphocytes Relative 12 %   Lymphs Abs 1.7 0.7 - 4.0 K/uL   Monocytes Relative 3 %   Monocytes Absolute 0.4 0.1 - 1.0 K/uL   Eosinophils Relative 1 %   Eosinophils Absolute 0.1 0.0 - 0.5 K/uL   Basophils Relative 1 %   Basophils Absolute 0.1 0.0 - 0.1 K/uL   Immature Granulocytes 1 %   Abs Immature Granulocytes 0.08 (H) 0.00 - 0.07 K/uL  Basic metabolic panel  Result Value Ref Range   Sodium 141 135 - 145 mmol/L   Potassium 3.8 3.5 - 5.1 mmol/L   Chloride 103 98 - 111 mmol/L   CO2 23 22 - 32 mmol/L   Glucose, Bld 147 (H) 70 - 99 mg/dL   BUN 19 8 - 23 mg/dL   Creatinine, Ser 0.71 0.44 - 1.00 mg/dL   Calcium 9.1 8.9 - 10.3 mg/dL   GFR, Estimated >60 >60 mL/min   Anion gap 15 5 - 15  Troponin I (High Sensitivity)  Result Value Ref Range   Troponin I (High Sensitivity) 16 <18 ng/L  Troponin I (High Sensitivity)  Result Value Ref Range   Troponin I (High Sensitivity) 26 (H) <18 ng/L   CT Angio Chest/Abd/Pel for Dissection W and/or Wo Contrast  Result Date: 02/09/2023 CLINICAL DATA:  Acute aortic  syndrome suspected. Chest pain and emesis. EXAM: CT ANGIOGRAPHY CHEST, ABDOMEN AND PELVIS TECHNIQUE: Non-contrast CT of the chest was initially obtained. Multidetector CT imaging through the chest, abdomen and pelvis  was performed using the standard protocol during bolus administration of intravenous contrast. Multiplanar reconstructed images and MIPs were obtained and reviewed to evaluate the vascular anatomy. RADIATION DOSE REDUCTION: This exam was performed according to the departmental dose-optimization program which includes automated exposure control, adjustment of the mA and/or kV according to patient size and/or use of iterative reconstruction technique. CONTRAST:  147mL OMNIPAQUE IOHEXOL 350 MG/ML SOLN COMPARISON:  12/28/2022. FINDINGS: CTA CHEST FINDINGS Cardiovascular: The heart is normal in size and there is a trace pericardial effusion. A few scattered coronary artery calcifications are noted. There is atherosclerotic calcification of the aorta without evidence of aneurysm or dissection. Pulmonary trunk is normal in caliber. Mediastinum/Nodes: No mediastinal, hilar, or axillary lymphadenopathy. The thyroid gland and trachea are within normal limits. The proximal esophagus is distended with debris. There is elevation of the left diaphragm with a large hiatal hernia containing the stomach distended with debris. Lungs/Pleura: Atelectasis is present bilaterally. There is atelectasis or infiltrate at the left lung base. A small left pleural effusion is noted. Musculoskeletal: Degenerative changes are present in the thoracic spine. No acute osseous abnormality. Review of the MIP images confirms the above findings. CTA ABDOMEN AND PELVIS FINDINGS VASCULAR Aorta: Normal caliber aorta without aneurysm, dissection, vasculitis or significant stenosis. Aortic atherosclerosis. Celiac: There is a small dissection flap in the proximal celiac artery. The hepatic artery and splenic arteries are patent. There is mild  narrowing of the proximal celiac artery with poststenotic dilatation measuring 1.2 cm. SMA: A dissection flap is noted in the mid SMA. No stenosis or aneurysm. Renals: Both renal arteries are patent without evidence of aneurysm, dissection, vasculitis, fibromuscular dysplasia or significant stenosis. IMA: Patent without evidence of aneurysm, dissection, vasculitis or significant stenosis. Inflow: Patent without evidence of aneurysm, dissection, vasculitis or significant stenosis. Veins: No obvious venous abnormality within the limitations of this arterial phase study. Review of the MIP images confirms the above findings. NON-VASCULAR Hepatobiliary: Subcentimeter hypodensities are present in the liver, statistically most likely representing cysts or hemangiomas. No biliary ductal dilatation. The gallbladder is without stones. Pancreas: Unremarkable. No pancreatic ductal dilatation or surrounding inflammatory changes. Spleen: Normal in size without focal abnormality. Adrenals/Urinary Tract: The adrenal glands are within normal limits. The kidneys enhance symmetrically. No renal calculus or hydronephrosis. Parapelvic cysts are noted on the left. The bladder is unremarkable. Stomach/Bowel: There is a large hiatal hernia in the stomach is completely intrathoracic in location and distended with debris. No free air or pneumatosis. Scattered diverticula are present along the colon without evidence of diverticulitis. A normal appendix is seen in the mid abdomen. Lymphatic: No abdominal or pelvic lymphadenopathy. Reproductive: A calcification is noted in the uterus, likely degenerating fibroid. There is a cystic structure in the left adnexa measuring 3.2 cm, slightly decreased from the prior exam. No adnexal mass on the right. Other: No abdominopelvic ascites.  No acute osseous abnormality. Musculoskeletal: No acute osseous abnormality. Review of the MIP images confirms the above findings. IMPRESSION: 1. Aortic  atherosclerosis without evidence of aneurysm or dissection. 2. 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 3. Large hiatal hernia with intrathoracic stomach. The stomach is markedly distended with air and debris, volvulus can not be completely excluded. Surgical consultation is suggested. 4. Small left pleural effusion with atelectasis or infiltrate. 5. Remaining incidental findings as described above. Electronically Signed   By: Brett Fairy M.D.   On: 02/09/2023 05:00   DG  Chest Portable 1 View  Result Date: 02/09/2023 CLINICAL DATA:  Chest pain. EXAM: PORTABLE CHEST 1 VIEW COMPARISON:  October 31, 2022 FINDINGS: There is stable mild to moderate severity enlargement of the cardiac silhouette. Tortuosity of the ascending and descending thoracic aorta is seen. There is a large gastric hernia which extends into the left lung base. This is increased in size when compared to the prior study. Subsequent elevation of the left hemidiaphragm is seen. Mild atelectasis is noted within the left lung base. There is no evidence of a pleural effusion or pneumothorax. The visualized skeletal structures are unremarkable. IMPRESSION: 1. Large gastric hernia, as described above. 2. Mild left basilar atelectasis. Electronically Signed   By: Virgina Norfolk M.D.   On: 02/09/2023 01:21     EKG EKG Interpretation  Date/Time:  Saturday February 09 2023 00:54:12 EDT Ventricular Rate:  84 PR Interval:  159 QRS Duration: 129 QT Interval:  444 QTC Calculation: 525 R Axis:   -31 Text Interpretation: Sinus rhythm Left bundle branch block Confirmed by Randal Buba, Jerran Tappan ([id number]) on 02/09/2023 2:43:07 AM  Radiology CT Angio Chest/Abd/Pel for Dissection W and/or Wo Contrast  Result Date: 02/09/2023 CLINICAL DATA:  Acute aortic syndrome suspected. Chest pain and emesis. EXAM: CT ANGIOGRAPHY CHEST, ABDOMEN AND PELVIS TECHNIQUE: Non-contrast CT of the chest  was initially obtained. Multidetector CT imaging through the chest, abdomen and pelvis was performed using the standard protocol during bolus administration of intravenous contrast. Multiplanar reconstructed images and MIPs were obtained and reviewed to evaluate the vascular anatomy. RADIATION DOSE REDUCTION: This exam was performed according to the departmental dose-optimization program which includes automated exposure control, adjustment of the mA and/or kV according to patient size and/or use of iterative reconstruction technique. CONTRAST:  153mL OMNIPAQUE IOHEXOL 350 MG/ML SOLN COMPARISON:  12/28/2022. FINDINGS: CTA CHEST FINDINGS Cardiovascular: The heart is normal in size and there is a trace pericardial effusion. A few scattered coronary artery calcifications are noted. There is atherosclerotic calcification of the aorta without evidence of aneurysm or dissection. Pulmonary trunk is normal in caliber. Mediastinum/Nodes: No mediastinal, hilar, or axillary lymphadenopathy. The thyroid gland and trachea are within normal limits. The proximal esophagus is distended with debris. There is elevation of the left diaphragm with a large hiatal hernia containing the stomach distended with debris. Lungs/Pleura: Atelectasis is present bilaterally. There is atelectasis or infiltrate at the left lung base. A small left pleural effusion is noted. Musculoskeletal: Degenerative changes are present in the thoracic spine. No acute osseous abnormality. Review of the MIP images confirms the above findings. CTA ABDOMEN AND PELVIS FINDINGS VASCULAR Aorta: Normal caliber aorta without aneurysm, dissection, vasculitis or significant stenosis. Aortic atherosclerosis. Celiac: There is a small dissection flap in the proximal celiac artery. The hepatic artery and splenic arteries are patent. There is mild narrowing of the proximal celiac artery with poststenotic dilatation measuring 1.2 cm. SMA: A dissection flap is noted in the mid  SMA. No stenosis or aneurysm. Renals: Both renal arteries are patent without evidence of aneurysm, dissection, vasculitis, fibromuscular dysplasia or significant stenosis. IMA: Patent without evidence of aneurysm, dissection, vasculitis or significant stenosis. Inflow: Patent without evidence of aneurysm, dissection, vasculitis or significant stenosis. Veins: No obvious venous abnormality within the limitations of this arterial phase study. Review of the MIP images confirms the above findings. NON-VASCULAR Hepatobiliary: Subcentimeter hypodensities are present in the liver, statistically most likely representing cysts or hemangiomas. No biliary ductal dilatation. The gallbladder is without stones. Pancreas: Unremarkable. No pancreatic ductal dilatation  or surrounding inflammatory changes. Spleen: Normal in size without focal abnormality. Adrenals/Urinary Tract: The adrenal glands are within normal limits. The kidneys enhance symmetrically. No renal calculus or hydronephrosis. Parapelvic cysts are noted on the left. The bladder is unremarkable. Stomach/Bowel: There is a large hiatal hernia in the stomach is completely intrathoracic in location and distended with debris. No free air or pneumatosis. Scattered diverticula are present along the colon without evidence of diverticulitis. A normal appendix is seen in the mid abdomen. Lymphatic: No abdominal or pelvic lymphadenopathy. Reproductive: A calcification is noted in the uterus, likely degenerating fibroid. There is a cystic structure in the left adnexa measuring 3.2 cm, slightly decreased from the prior exam. No adnexal mass on the right. Other: No abdominopelvic ascites.  No acute osseous abnormality. Musculoskeletal: No acute osseous abnormality. Review of the MIP images confirms the above findings. IMPRESSION: 1. Aortic atherosclerosis without evidence of aneurysm or dissection. 2. Small dissection flap in the proximal celiac artery and mid superior mesenteric  artery, likely present on the prior exam. 3. Large hiatal hernia with intrathoracic stomach. The stomach is markedly distended with air and debris, volvulus can not be completely excluded. Surgical consultation is suggested. 4. Small left pleural effusion with atelectasis or infiltrate. 5. Remaining incidental findings as described above. Electronically Signed   By: Brett Fairy M.D.   On: 02/09/2023 05:00   DG Chest Portable 1 View  Result Date: 02/09/2023 CLINICAL DATA:  Chest pain. EXAM: PORTABLE CHEST 1 VIEW COMPARISON:  October 31, 2022 FINDINGS: There is stable mild to moderate severity enlargement of the cardiac silhouette. Tortuosity of the ascending and descending thoracic aorta is seen. There is a large gastric hernia which extends into the left lung base. This is increased in size when compared to the prior study. Subsequent elevation of the left hemidiaphragm is seen. Mild atelectasis is noted within the left lung base. There is no evidence of a pleural effusion or pneumothorax. The visualized skeletal structures are unremarkable. IMPRESSION: 1. Large gastric hernia, as described above. 2. Mild left basilar atelectasis. Electronically Signed   By: Virgina Norfolk M.D.   On: 02/09/2023 01:21    Procedures Procedures    Medications Ordered in ED Medications  famotidine (PEPCID) IVPB 20 mg premix (0 mg Intravenous Stopped 02/09/23 0211)  fentaNYL (SUBLIMAZE) injection 25 mcg (25 mcg Intravenous Given 02/09/23 0249)  ondansetron (ZOFRAN) injection 4 mg (4 mg Intravenous Given 02/09/23 0338)  iohexol (OMNIPAQUE) 350 MG/ML injection 100 mL (100 mLs Intravenous Contrast Given 02/09/23 0411)    ED Course/ Medical Decision Making/ A&P                             Medical Decision Making Patient with hernia surgery most recently in December with recurrent chest and back pain.    Amount and/or Complexity of Data Reviewed Independent Historian: EMS    Details: See above  External Data  Reviewed: notes.    Details: Previous notes reviewed  Labs: ordered.    Details: All labs reviewed:  troponin 16/26.  White count elevated 14.7, hemoglobin 12.5, elevated platelets 553.  Normal sodium 141, normal potassium normal creatinine .52  Radiology: ordered and independent interpretation performed.    Details: Stomach within the thoracic cavity by me on CTA ECG/medicine tests: ordered and independent interpretation performed. Decision-making details documented in ED Course. Discussion of management or test interpretation with external provider(s): Case d/w Dr. Thermon Leyland who will see the  patient, has placed order for NGT Case d/w Dr. Donzetta Matters who has reviewed CTA and findings in celiac SMA appeared chronic.  Please call with further concerns.   Risk Prescription drug management. Decision regarding hospitalization.    Final Clinical Impression(s) / ED Diagnoses Final diagnoses:  Nausea and vomiting, unspecified vomiting type  Hiatal hernia   The patient appears reasonably stabilized for admission considering the current resources, flow, and capabilities available in the ED at this time, and I doubt any other [facility] requiring further screening and/or treatment in the ED prior to admission.  Rx / DC Orders ED Discharge Orders     None         Burch Marchuk, MD 02/09/23 [phone]

## 2023-02-09 NOTE — Anesthesia Preprocedure Evaluation (Addendum)
Anesthesia Evaluation  Patient identified by MRN, date of birth, ID bandGeneral Assessment Comment:Awake, somnolent   Reviewed: Allergy & Precautions, NPO status , Patient's Chart, lab work & pertinent test results, Unable to perform ROS - Chart review onlyPreop documentation limited or incomplete due to emergent nature of procedure.  History of Anesthesia Complications Negative for: history of anesthetic complications  Airway Mallampati: III  TM Distance: >3 FB Neck ROM: Full    Dental  (+) Teeth Intact, Dental Advisory Given   Pulmonary neg shortness of breath, neg sleep apnea, neg COPD, neg recent URI   breath sounds clear to auscultation       Cardiovascular + Valvular Problems/Murmurs AI  Rhythm:Regular  1. Left ventricular ejection fraction, by estimation, is 60 to 65%. The  left ventricle has normal function. The left ventricle has no regional  wall motion abnormalities. Left ventricular diastolic parameters are  consistent with Grade I diastolic  dysfunction (impaired relaxation). The average left ventricular global  longitudinal strain is -17.6 %.   2. Right ventricular systolic function is normal. The right ventricular  size is normal. There is normal pulmonary artery systolic pressure.   3. The mitral valve is normal in structure. No evidence of mitral valve  regurgitation. No evidence of mitral stenosis.   4. Tricuspid valve regurgitation is moderate.   5. The aortic valve is normal in structure. Aortic valve regurgitation is  mild to moderate. No aortic stenosis is present.   6. The inferior vena cava is normal in size with greater than 50%  respiratory variability, suggesting right atrial pressure of 3 mmHg.   Neg stress 2021    Neuro/Psych neg Seizures negative neurological ROS  negative psych ROS   GI/Hepatic Neg liver ROS, hiatal hernia,GERD  Medicated,,GASTRIC VOLVULUS   Endo/Other  Hypothyroidism     Renal/GU negative Renal ROSLab Results      Component                Value               Date                      CREATININE               0.75                02/09/2023                Musculoskeletal  (+) Arthritis ,    Abdominal   Peds  Hematology negative hematology ROS (+) Lab Results      Component                Value               Date                      WBC                      22.9 (H)            02/09/2023                HGB                      12.8                02/09/2023  HCT                      41.5                02/09/2023                MCV                      92.8                02/09/2023                PLT                      611 (H)             02/09/2023              Anesthesia Other Findings   Reproductive/Obstetrics                             Anesthesia Physical Anesthesia Plan  ASA: 5 and emergent  Anesthesia Plan: General   Post-op Pain Management:    Induction: Intravenous, Rapid sequence and Cricoid pressure planned  PONV Risk Score and Plan: 3 and Ondansetron and Dexamethasone  Airway Management Planned: Oral ETT  Additional Equipment:   Intra-op Plan:   Post-operative Plan: Possible Post-op intubation/ventilation  Informed Consent:      History available from chart only and Only emergency history available  Plan Discussed with: CRNA  Anesthesia Plan Comments: (Possible a line)       Anesthesia Quick Evaluation

## 2023-02-09 NOTE — Anesthesia Procedure Notes (Signed)
Procedure Name: Intubation Date/Time: 02/09/2023 7:35 AM  Performed by: Inda Coke, CRNAPre-anesthesia Checklist: Patient identified, Emergency Drugs available, Suction available, Timeout performed and Patient being monitored Patient Re-evaluated:Patient Re-evaluated prior to induction Oxygen Delivery Method: Circle system utilized Preoxygenation: Pre-oxygenation with 100% oxygen Induction Type: IV induction Ventilation: Mask ventilation without difficulty Laryngoscope Size: Mac and 3 Grade View: Grade I Tube type: Oral Tube size: 7.0 mm Number of attempts: 1 Airway Equipment and Method: Stylet Placement Confirmation: ETT inserted through vocal cords under direct vision, positive ETCO2, CO2 detector and breath sounds checked- equal and bilateral Secured at: 22 cm Tube secured with: Tape Dental Injury: Teeth and Oropharynx as per pre-operative assessment

## 2023-02-09 NOTE — ED Notes (Addendum)
Unsuccessful attempts at NG tube placement through left nare by surgeon.

## 2023-02-09 NOTE — ED Notes (Signed)
Consent is signed and at the bedside.

## 2023-02-09 NOTE — ED Notes (Signed)
ED TO INPATIENT HANDOFF REPORT  ED Nurse Name and Phone #: [phone] Graciella Freer Name/Age/Gender Monica White 82 y.o. female Room/Bed: 030C/030C  Code Status   Code Status: Full Code  Home/SNF/Other Home Patient oriented to: self, place, time, and situation Is this baseline? Yes   Triage Complete: Triage complete  Chief Complaint Gastric volvulus [K31.89]  Triage Note BIB EMS Cx pain 30 mins prior to EMS being called. Chest pain radiates to back and left arm. 10/10 pain. EMS states upon arrival pt was cold/pale/vomiting. Pt sts she had hiatal hernia in the past and this is same symptoms.  EMS gave 324 aspirin, 0.4 nitro, 4mg  zofran, 4mg  morphine without relief.  EKG showed LBBB.,    Allergies Allergies  Allergen Reactions   Atorvastatin Other (See Comments)    Stiffness   Escitalopram Other (See Comments)    Stiffness   Pitavastatin Other (See Comments)    Stiffness   Tramadol Other (See Comments)    "Felt bad and headache"    Level of Care/Admitting Diagnosis ED Disposition     ED Disposition  Admit   Condition  --   [place]: [place] [100100]  Level of Care: Med-Surg [16]  May admit patient to [facility] or [facility] if equivalent level of care is available:: No  Covid Evaluation: Asymptomatic - no recent exposure (last 10 days) testing not required  Diagnosis: Gastric volvulus [id number]  Admitting Physician: [place], [place]  Attending Physician: [place], [place] [id number]  Certification:: I certify this patient will need inpatient services for at least 2 midnights  Estimated Length of Stay: 5          B Medical/Surgery History Past Medical History:  Diagnosis Date   Anemia    Arthritis    Dyspnea    GERD (gastroesophageal reflux disease)    History of hiatal hernia    Hypercholesteremia    Hypothyroid 2015   Past Surgical History:  Procedure Laterality Date   COLONOSCOPY     HIATAL HERNIA REPAIR N/A 03/12/2022    Procedure: LAPAROSCOPIC REPAIR OF HIATAL HERNIA;  Surgeon: Kieth Brightly Arta Bruce, MD;  Location: [facility];  Service: General;  Laterality: N/A;   RIGHT HEART CATH AND CORONARY ANGIOGRAPHY N/A 12/01/2021   Procedure: RIGHT HEART CATH AND CORONARY ANGIOGRAPHY;  Surgeon: Sherren Mocha, MD;  Location: [facility];  Service: Cardiovascular;  Laterality: N/A;   TUBAL LIGATION  11/27/1983   UPPER GASTROINTESTINAL ENDOSCOPY     XI ROBOTIC ASSISTED HIATAL HERNIA REPAIR N/A 11/06/2022   Procedure: XI ROBOTIC REDUCTIONOF INCARCERATED HIATAL HERNIA REPAIR, LAPAROSCOPIC G-TUBE INSERTION, EGD;  Surgeon: Kinsinger, Arta Bruce, MD;  Location: [facility];  Service: General;  Laterality: N/A;     A IV Location/Drains/Wounds Patient Lines/Drains/Airways Status     Active Line/Drains/Airways     Name Placement date Placement time Site Days   Peripheral IV 02/09/23 20 G Left Antecubital 02/09/23  0102  Antecubital  less than 1   Gastrostomy/Enterostomy Gastrostomy 18 Fr. LUQ 11/06/22  1552  LUQ  95   Incision - 6 Ports Abdomen 1: Left;Lateral 2: Left 3: Umbilicus 4: Right 5: Right;Lateral 6: Upper 03/12/22  1210  -- 334   Incision - 6 Ports Abdomen 1: Left;Lateral;Mid 2: Mid;Left 3: Upper;Mid 4: Medial 5: Right;Mid 6: Mid;Lateral 11/06/22  1438  -- 95            Intake/Output Last 24 hours No intake or output data in the  24 hours ending 02/09/23 0539  Labs/Imaging Results for orders placed or performed during the hospital encounter of 02/09/23 (from the past 48 hour(s))  CBC with Differential     Status: Abnormal   Collection Time: 02/09/23  1:02 AM  Result Value Ref Range   WBC 14.7 (H) 4.0 - 10.5 K/uL   RBC 4.37 3.87 - 5.11 MIL/uL   Hemoglobin 12.5 12.0 - 15.0 g/dL   HCT 40.0 36.0 - 46.0 %   MCV 91.5 80.0 - 100.0 fL   MCH 28.6 26.0 - 34.0 pg   MCHC 31.3 30.0 - 36.0 g/dL   RDW 15.7 (H) 11.5 - 15.5 %   Platelets 553 (H) 150 - 400 K/uL   nRBC 0.0 0.0 - 0.2 %   Neutrophils Relative % 82 %    Neutro Abs 12.2 (H) 1.7 - 7.7 K/uL   Lymphocytes Relative 12 %   Lymphs Abs 1.7 0.7 - 4.0 K/uL   Monocytes Relative 3 %   Monocytes Absolute 0.4 0.1 - 1.0 K/uL   Eosinophils Relative 1 %   Eosinophils Absolute 0.1 0.0 - 0.5 K/uL   Basophils Relative 1 %   Basophils Absolute 0.1 0.0 - 0.1 K/uL   Immature Granulocytes 1 %   Abs Immature Granulocytes 0.08 (H) 0.00 - 0.07 K/uL    Comment: Performed at [facility], [address] [place], [place] [id number]  Basic metabolic panel     Status: Abnormal   Collection Time: 02/09/23  1:02 AM  Result Value Ref Range   Sodium 141 135 - 145 mmol/L   Potassium 3.8 3.5 - 5.1 mmol/L    Comment: HEMOLYSIS AT THIS LEVEL MAY AFFECT RESULT   Chloride 103 98 - 111 mmol/L   CO2 23 22 - 32 mmol/L   Glucose, Bld 147 (H) 70 - 99 mg/dL    Comment: Glucose reference range applies only to samples taken after fasting for at least 8 hours.   BUN 19 8 - 23 mg/dL   Creatinine, Ser 0.71 0.44 - 1.00 mg/dL   Calcium 9.1 8.9 - 10.3 mg/dL   GFR, Estimated >60 >60 mL/min    Comment: (NOTE) Calculated using the CKD-EPI Creatinine Equation (2021)    Anion gap 15 5 - 15    Comment: Performed at [facility] [address] [place], [place] [address]  Troponin I (High Sensitivity)     Status: None   Collection Time: 02/09/23  1:02 AM  Result Value Ref Range   Troponin I (High Sensitivity) 16 <18 ng/L    Comment: (NOTE) Elevated high sensitivity troponin I (hsTnI) values and significant  changes across serial measurements may suggest ACS but many other  chronic and acute conditions are known to elevate hsTnI results.  Refer to the "Links" section for chest pain algorithms and additional  guidance. Performed at [facility], [place] [address] [place], [place] [address]   Troponin I (High Sensitivity)     Status: Abnormal   Collection Time: 02/09/23  2:38 AM  Result Value Ref Range   Troponin I (High Sensitivity) 26 (H) <18 ng/L    Comment:  (NOTE) Elevated high sensitivity troponin I (hsTnI) values and significant  changes across serial measurements may suggest ACS but many other  chronic and acute conditions are known to elevate hsTnI results.  Refer to the "Links" section for chest pain algorithms and additional  guidance. Performed at [facility], [place],  [place] [address]    CT Angio Chest/Abd/Pel for Dissection W and/or Wo Contrast  Result Date: 02/09/2023 CLINICAL DATA:  Acute aortic syndrome suspected. Chest pain and emesis. EXAM: CT ANGIOGRAPHY CHEST, ABDOMEN AND PELVIS TECHNIQUE: Non-contrast CT of the chest was initially obtained. Multidetector CT imaging through the chest, abdomen and pelvis was performed using the standard protocol during bolus administration of intravenous contrast. Multiplanar reconstructed images and MIPs were obtained and reviewed to evaluate the vascular anatomy. RADIATION DOSE REDUCTION: This exam was performed according to the departmental dose-optimization program which includes automated exposure control, adjustment of the mA and/or kV according to patient size and/or use of iterative reconstruction technique. CONTRAST:  149mL OMNIPAQUE IOHEXOL 350 MG/ML SOLN COMPARISON:  12/28/2022. FINDINGS: CTA CHEST FINDINGS Cardiovascular: The heart is normal in size and there is a trace pericardial effusion. A few scattered coronary artery calcifications are noted. There is atherosclerotic calcification of the aorta without evidence of aneurysm or dissection. Pulmonary trunk is normal in caliber. Mediastinum/Nodes: No mediastinal, hilar, or axillary lymphadenopathy. The thyroid gland and trachea are within normal limits. The proximal esophagus is distended with debris. There is elevation of the left diaphragm with a large hiatal hernia containing the stomach distended with debris. Lungs/Pleura: Atelectasis is present bilaterally. There is atelectasis or infiltrate at the left lung base. A  small left pleural effusion is noted. Musculoskeletal: Degenerative changes are present in the thoracic spine. No acute osseous abnormality. Review of the MIP images confirms the above findings. CTA ABDOMEN AND PELVIS FINDINGS VASCULAR Aorta: Normal caliber aorta without aneurysm, dissection, vasculitis or significant stenosis. Aortic atherosclerosis. Celiac: There is a small dissection flap in the proximal celiac artery. The hepatic artery and splenic arteries are patent. There is mild narrowing of the proximal celiac artery with poststenotic dilatation measuring 1.2 cm. SMA: A dissection flap is noted in the mid SMA. No stenosis or aneurysm. Renals: Both renal arteries are patent without evidence of aneurysm, dissection, vasculitis, fibromuscular dysplasia or significant stenosis. IMA: Patent without evidence of aneurysm, dissection, vasculitis or significant stenosis. Inflow: Patent without evidence of aneurysm, dissection, vasculitis or significant stenosis. Veins: No obvious venous abnormality within the limitations of this arterial phase study. Review of the MIP images confirms the above findings. NON-VASCULAR Hepatobiliary: Subcentimeter hypodensities are present in the liver, statistically most likely representing cysts or hemangiomas. No biliary ductal dilatation. The gallbladder is without stones. Pancreas: Unremarkable. No pancreatic ductal dilatation or surrounding inflammatory changes. Spleen: Normal in size without focal abnormality. Adrenals/Urinary Tract: The adrenal glands are within normal limits. The kidneys enhance symmetrically. No renal calculus or hydronephrosis. Parapelvic cysts are noted on the left. The bladder is unremarkable. Stomach/Bowel: There is a large hiatal hernia in the stomach is completely intrathoracic in location and distended with debris. No free air or pneumatosis. Scattered diverticula are present along the colon without evidence of diverticulitis. A normal appendix is seen  in the mid abdomen. Lymphatic: No abdominal or pelvic lymphadenopathy. Reproductive: A calcification is noted in the uterus, likely degenerating fibroid. There is a cystic structure in the left adnexa measuring 3.2 cm, slightly decreased from the prior exam. No adnexal mass on the right. Other: No abdominopelvic ascites.  No acute osseous abnormality. Musculoskeletal: No acute osseous abnormality. Review of the MIP images confirms the above findings. IMPRESSION: 1. Aortic atherosclerosis without evidence of aneurysm or dissection. 2. 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 3. Large hiatal hernia with  intrathoracic stomach. The stomach is markedly distended with air and debris, volvulus can not be completely excluded. Surgical consultation is suggested. 4. Small left pleural effusion with atelectasis or infiltrate. 5. Remaining incidental findings as described above. Electronically Signed   By: Brett Fairy M.D.   On: 02/09/2023 05:00   DG Chest Portable 1 View  Result Date: 02/09/2023 CLINICAL DATA:  Chest pain. EXAM: PORTABLE CHEST 1 VIEW COMPARISON:  October 31, 2022 FINDINGS: There is stable mild to moderate severity enlargement of the cardiac silhouette. Tortuosity of the ascending and descending thoracic aorta is seen. There is a large gastric hernia which extends into the left lung base. This is increased in size when compared to the prior study. Subsequent elevation of the left hemidiaphragm is seen. Mild atelectasis is noted within the left lung base. There is no evidence of a pleural effusion or pneumothorax. The visualized skeletal structures are unremarkable. IMPRESSION: 1. Large gastric hernia, as described above. 2. Mild left basilar atelectasis. Electronically Signed   By: Virgina Norfolk M.D.   On: 02/09/2023 01:21    Pending Labs Unresulted Labs (From admission, onward)     Start     Ordered   02/16/23 0500   Creatinine, serum  (enoxaparin (LOVENOX)    CrCl >/= 30 ml/min)  Weekly,   R     Comments: while on enoxaparin therapy    02/09/23 0516   02/10/23 [id number]  Basic metabolic panel  Daily,   R      02/09/23 0516   02/10/23 0500  CBC  Daily,   R      02/09/23 0516   02/09/23 0517  Blood culture (routine x 2)  BLOOD CULTURE X 2,   R     Question Answer Comment  Patient immune status Normal   Release to patient Immediate      02/09/23 0517   02/09/23 0514  CBC  (enoxaparin (LOVENOX)    CrCl >/= 30 ml/min)  Once,   R       Comments: Baseline for enoxaparin therapy IF NOT ALREADY DRAWN.  Notify MD if PLT < 100 K.    02/09/23 0516   02/09/23 0514  Creatinine, serum  (enoxaparin (LOVENOX)    CrCl >/= 30 ml/min)  Once,   R       Comments: Baseline for enoxaparin therapy IF NOT ALREADY DRAWN.    02/09/23 0516            Vitals/Pain Today's Vitals   02/09/23 0129 02/09/23 0200 02/09/23 0230 02/09/23 0341  BP: (!) 116/93 (!) 145/94 (!) 149/90   Pulse: 82 83 86   Resp: 16 19 (!) 21   Temp: 97.6 F (36.4 C)     TempSrc: Oral     SpO2: 95% 95% 93%   Weight:      Height:      PainSc:    9     Isolation Precautions No active isolations  Medications Medications  enoxaparin (LOVENOX) injection 40 mg (has no administration in time range)  lactated ringers infusion (has no administration in time range)  acetaminophen (TYLENOL) tablet 650 mg (has no administration in time range)  HYDROmorphone (DILAUDID) injection 0.5 mg (has no administration in time range)  methocarbamol (ROBAXIN) 500 mg in dextrose 5 % 50 mL IVPB (has no administration in time range)  ondansetron (ZOFRAN) injection 4 mg (has no administration in time range)  prochlorperazine (COMPAZINE) injection 10 mg (has no administration in time range)  simethicone (  MYLICON) chewable tablet 80 mg (has no administration in time range)  pantoprazole (PROTONIX) injection 40 mg (has no administration in time range)  ceFEPIme  (MAXIPIME) 2 g in sodium chloride 0.9 % 100 mL IVPB (has no administration in time range)  metroNIDAZOLE (FLAGYL) IVPB 500 mg (has no administration in time range)  famotidine (PEPCID) IVPB 20 mg premix (0 mg Intravenous Stopped 02/09/23 0211)  fentaNYL (SUBLIMAZE) injection 25 mcg (25 mcg Intravenous Given 02/09/23 0249)  ondansetron (ZOFRAN) injection 4 mg (4 mg Intravenous Given 02/09/23 0338)  iohexol (OMNIPAQUE) 350 MG/ML injection 100 mL (100 mLs Intravenous Contrast Given 02/09/23 0411)    Mobility walks     Focused Assessments Cardiac Assessment Handoff:  Cardiac Rhythm: Bundle branch block No results found for: "CKTOTAL", "CKMB", "CKMBINDEX", "TROPONINI" No results found for: "DDIMER" Does the Patient currently have chest pain? No    R Recommendations: See Admitting Provider Note  Report given to:   Additional Notes: Surgeon is attempted NG tube currently.

## 2023-02-10 ENCOUNTER — Encounter (HOSPITAL_COMMUNITY): Payer: Self-pay | Admitting: Surgery

## 2023-02-10 LAB — CBC
HCT: 30.1 % — ABNORMAL LOW (ref 36.0–46.0)
Hemoglobin: 9.6 g/dL — ABNORMAL LOW (ref 12.0–15.0)
MCH: 29.4 pg (ref 26.0–34.0)
MCHC: 31.9 g/dL (ref 30.0–36.0)
MCV: 92.3 fL (ref 80.0–100.0)
Platelets: 395 10*3/uL (ref 150–400)
RBC: 3.26 MIL/uL — ABNORMAL LOW (ref 3.87–5.11)
RDW: 15.9 % — ABNORMAL HIGH (ref 11.5–15.5)
WBC: 16.5 10*3/uL — ABNORMAL HIGH (ref 4.0–10.5)
nRBC: 0 % (ref 0.0–0.2)

## 2023-02-10 LAB — BASIC METABOLIC PANEL
Anion gap: 9 (ref 5–15)
BUN: 15 mg/dL (ref 8–23)
CO2: 25 mmol/L (ref 22–32)
Calcium: 7.9 mg/dL — ABNORMAL LOW (ref 8.9–10.3)
Chloride: 106 mmol/L (ref 98–111)
Creatinine, Ser: 0.74 mg/dL (ref 0.44–1.00)
GFR, Estimated: >60 mL/min (ref >60)
Glucose, Bld: 122 mg/dL — ABNORMAL HIGH (ref 70–99)
Potassium: 4.5 mmol/L (ref 3.5–5.1)
Sodium: 140 mmol/L (ref 135–145)

## 2023-02-10 NOTE — Progress Notes (Signed)
Patient ID: Monica White, female   DOB: Jul 27, 1941, 82 y.o.   MRN: [id number] 1 Day Post-Op    Subjective: Sore, no BM ROS negative except as listed above. Objective: Vital signs in last 24 hours: Temp:  [97.1 F (36.2 C)-98.7 F (37.1 C)] 98.2 F (36.8 C) (03/17 0726) Pulse Rate:  [77-87] 77 (03/17 0726) Resp:  [13-23] 18 (03/17 0726) BP: (100-141)/(60-84) 103/68 (03/17 0726) SpO2:  [91 %-98 %] 94 % (03/17 0726)    Intake/Output from previous day: 03/16 0701 - 03/17 0700 In: 2968.2 [I.V.:2578.2; IV Piggyback:350] Out: 1195 [Urine:700; Drains:445; Blood:50] Intake/Output this shift: Total I/O In: 10 [Other:10] Out: 10 [Drains:10]  General appearance: alert and cooperative Resp: clear to auscultation bilaterally GI: G tube draining, JPs SS, soft  Lab Results: CBC  Recent Labs    02/09/23 0605 02/10/23 0202  WBC 22.9* 16.5*  HGB 12.8 9.6*  HCT 41.5 30.1*  PLT 611* 395   BMET Recent Labs    02/09/23 0102 02/09/23 0605 02/10/23 0202  NA 141  --  140  K 3.8  --  4.5  CL 103  --  106  CO2 23  --  25  GLUCOSE 147*  --  122*  BUN 19  --  15  CREATININE 0.71 0.75 0.74  CALCIUM 9.1  --  7.9*   PT/INR No results for input(s): "LABPROT", "INR" in the last 72 hours. ABG No results for input(s): "PHART", "HCO3" in the last 72 hours.  Invalid input(s): "PCO2", "PO2"  Studies/Results:   Anti-infectives: Anti-infectives (From admission, onward)    Start     Dose/Rate Route Frequency Ordered Stop   02/09/23 0719  metroNIDAZOLE (FLAGYL) 500 MG/100ML IVPB       Note to Pharmacy: Rejeana Brock L: cabinet override      02/09/23 0719 02/09/23 0758   02/09/23 0530  ceFEPIme (MAXIPIME) 2 g in sodium chloride 0.9 % 100 mL IVPB        2 g 200 mL/hr over 30 Minutes Intravenous  Once 02/09/23 0520 02/09/23 0820   02/09/23 0530  metroNIDAZOLE (FLAGYL) IVPB 500 mg        500 mg 100 mL/hr over 60 Minutes Intravenous  Once 02/09/23 0520 02/09/23 0736        Assessment/Plan: Recurrent hiatal hernia S/P upper endo, laparoscopic reduction and G tube placement by Dr. Thermon Leyland 3/16 - had some ischemic gastric mucosa, JPs SS, plan NPO and G tube to gravity until bowel function. The get UGI before starting PO FEN - IVF, NPO VTE - lovenox Dispo - med surg  LOS: 1 day    Georganna Skeans, MD, MPH, FACS Trauma & General Surgery Use [email or web address] to contact on call provider  02/10/2023

## 2023-02-10 NOTE — Plan of Care (Signed)

## 2023-02-11 LAB — CBC
HCT: 32 % — ABNORMAL LOW (ref 36.0–46.0)
Hemoglobin: 10.3 g/dL — ABNORMAL LOW (ref 12.0–15.0)
MCH: 29.3 pg (ref 26.0–34.0)
MCHC: 32.2 g/dL (ref 30.0–36.0)
MCV: 90.9 fL (ref 80.0–100.0)
Platelets: 390 10*3/uL (ref 150–400)
RBC: 3.52 MIL/uL — ABNORMAL LOW (ref 3.87–5.11)
RDW: 16 % — ABNORMAL HIGH (ref 11.5–15.5)
WBC: 14.3 10*3/uL — ABNORMAL HIGH (ref 4.0–10.5)
nRBC: 0 % (ref 0.0–0.2)

## 2023-02-11 LAB — BASIC METABOLIC PANEL
Anion gap: 8 (ref 5–15)
BUN: 12 mg/dL (ref 8–23)
CO2: 25 mmol/L (ref 22–32)
Calcium: 7.7 mg/dL — ABNORMAL LOW (ref 8.9–10.3)
Chloride: 104 mmol/L (ref 98–111)
Creatinine, Ser: 0.67 mg/dL (ref 0.44–1.00)
GFR, Estimated: >60 mL/min (ref >60)
Glucose, Bld: 103 mg/dL — ABNORMAL HIGH (ref 70–99)
Potassium: 3.3 mmol/L — ABNORMAL LOW (ref 3.5–5.1)
Sodium: 137 mmol/L (ref 135–145)

## 2023-02-11 LAB — MAGNESIUM: Magnesium: 2 mg/dL (ref 1.7–2.4)

## 2023-02-11 MED ORDER — POTASSIUM CHLORIDE IN NACL 20-0.45 MEQ/L-% IV SOLN
INTRAVENOUS | Status: AC
  Filled 2023-02-11 (×5): qty 1000

## 2023-02-11 MED ORDER — POTASSIUM CHLORIDE 10 MEQ/100ML IV SOLN
10.0000 meq | INTRAVENOUS | Status: AC
  Administered 2023-02-11 (×5): 10 meq via INTRAVENOUS
  Filled 2023-02-11 (×5): qty 100

## 2023-02-11 NOTE — Progress Notes (Signed)
Patient ID: Monica White, female   DOB: [birth year], 82 y.o.   MRN: [id number] 2 Days Post-Op    Subjective: Reports trouble wither her IV's leaking. Mild abd soreness. no BM. States she got OOB yesterday and would like to get up with PT today.   ROS negative except as listed above. Objective: Vital signs in last 24 hours: Temp:  [97.8 F (36.6 C)-98.5 F (36.9 C)] 98.5 F (36.9 C) (03/18 0417) Pulse Rate:  [86-92] 86 (03/18 0417) Resp:  [16-18] 16 (03/18 0417) BP: (116-138)/(70-79) 120/79 (03/18 0417) SpO2:  [90 %-93 %] 90 % (03/18 0417)    Intake/Output from previous day: 03/17 0701 - 03/18 0700 In: 815.9 [I.V.:755.9] Out: 405 [Urine:5; Drains:400] Intake/Output this shift: No intake/output data recorded.  General appearance: alert and cooperative Resp: clear to auscultation bilaterally GI: mild distention, G tube draining bilious effluent - 150-200 mL, JPs SS, soft  Lab Results: CBC  Recent Labs    02/10/23 0202 02/11/23 0209  WBC 16.5* 14.3*  HGB 9.6* 10.3*  HCT 30.1* 32.0*  PLT 395 390   BMET Recent Labs    02/10/23 0202 02/11/23 0209  NA 140 137  K 4.5 3.3*  CL 106 104  CO2 25 25  GLUCOSE 122* 103*  BUN 15 12  CREATININE 0.74 0.67  CALCIUM 7.9* 7.7*   PT/INR No results for input(s): "LABPROT", "INR" in the last 72 hours. ABG No results for input(s): "PHART", "HCO3" in the last 72 hours.  Invalid input(s): "PCO2", "PO2"  Studies/Results:   Anti-infectives: Anti-infectives (From admission, onward)    Start     Dose/Rate Route Frequency Ordered Stop   02/09/23 0719  metroNIDAZOLE (FLAGYL) 500 MG/100ML IVPB       Note to Pharmacy: Rejeana Brock L: cabinet override      02/09/23 0719 02/09/23 0758   02/09/23 0530  ceFEPIme (MAXIPIME) 2 g in sodium chloride 0.9 % 100 mL IVPB        2 g 200 mL/hr over 30 Minutes Intravenous  Once 02/09/23 0520 02/09/23 0820   02/09/23 0530  metroNIDAZOLE (FLAGYL) IVPB 500 mg        500 mg 100 mL/hr over 60  Minutes Intravenous  Once 02/09/23 0520 02/09/23 0736       Assessment/Plan: Recurrent hiatal hernia S/P upper endo, laparoscopic reduction and G tube placement by Dr. Thermon Leyland 3/16 -  - AFVSS, WBC 14.3 from 16.5 -  had some ischemic gastric mucosa, JPs SS, plan NPO and G tube to gravity until bowel function. The get UGI before starting PO  FEN - 1/2 NS + 20 mEq KCl @ 75 mL/hr, NPO; hypokalemia 3.3 >> replete w/ 50 mEq IV KCl, check mg level  VTE - lovenox ID: cefepime/flagyl x 1 dose 3/16 Dispo - med surg   LOS: 2 days    Obie Dredge, PA-C  Trauma & General Surgery Use [email or web address] to contact on call provider  02/11/2023

## 2023-02-11 NOTE — Progress Notes (Signed)
Mobility Specialist - Progress Note   02/11/23 1508  Mobility  Activity Ambulated with assistance in room  Level of Assistance Contact guard assist, steadying assist  Assistive Device None  Distance Ambulated (ft) 10 ft  Activity Response Tolerated well  Mobility Referral Yes  $Mobility charge 1 Mobility   Pt was received in chair needing assistance to BR. Pt was CG throughout ambulation. Pt with successful void. Pt was left in bed with all needs met. RN present.   Franki Monte  Mobility Specialist Please contact via Solicitor or Rehab office at [phone]

## 2023-02-11 NOTE — Evaluation (Signed)
Physical Therapy Evaluation Patient Details Name: Monica White MRN: [id number] DOB: Feb 20, 1941 Today's Date: 02/11/2023  History of Present Illness  82 y.o. female presents to [facility] hospital on 02/09/2023 with chest pain, vomiting. Pt admitted for management of gastric volvulus due to recurrent hiatal hernia. Pt underwent upper endoscopy and laparoscopic reduction with G tube placement on 3/16. PMH includes anemia, OA, DOE, GERD.  Clinical Impression  Pt presents to PT with deficits In strength, power, endurance, gait, balance. Pt is limited by pain this session, declining ambulation out of the room due to R flank discomfort. Pt is able to stand and transfer without physical assistance, although does appear weak compared to baseline, with slow and cautious steps when ambulating. Pt will benefit from frequent mobilization in an effort to restore independence. PT is hopeful the pt will progress well if mobilized frequently.       Recommendations for follow up therapy are one component of a multi-disciplinary discharge planning process, led by the attending physician.  Recommendations may be updated based on patient status, additional functional criteria and insurance authorization.  Follow Up Recommendations No PT follow up      Assistance Recommended at Discharge PRN  Patient can return home with the following  A little help with bathing/dressing/bathroom;Assistance with cooking/housework;Assist for transportation;Help with stairs or ramp for entrance    Equipment Recommendations None recommended by PT  Recommendations for Other Services       Functional Status Assessment Patient has had a recent decline in their functional status and demonstrates the ability to make significant improvements in function in a reasonable and predictable amount of time.     Precautions / Restrictions Precautions Precautions: Fall Precaution Comments: JP x2, gastric drain Restrictions Weight Bearing  Restrictions: No      Mobility  Bed Mobility Overal bed mobility: Needs Assistance Bed Mobility: Rolling, Sidelying to Sit, Sit to Supine Rolling: Min guard Sidelying to sit: Min assist   Sit to supine: Min guard        Transfers Overall transfer level: Needs assistance Equipment used: None Transfers: Sit to/from Stand Sit to Stand: Min guard                Ambulation/Gait Ambulation/Gait assistance: Min guard Gait Distance (Feet): 15 Feet (15' x 2) Assistive device: None Gait Pattern/deviations: Step-to pattern Gait velocity: reduced Gait velocity interpretation: <1.8 ft/sec, indicate of risk for recurrent falls   General Gait Details: slowed step-to gait, reduced stride length  Stairs            Wheelchair Mobility    Modified Rankin (Stroke Patients Only)       Balance Overall balance assessment: Needs assistance Sitting-balance support: No upper extremity supported, Feet supported Sitting balance-Leahy Scale: Good     Standing balance support: No upper extremity supported, During functional activity Standing balance-Leahy Scale: Fair                               Pertinent Vitals/Pain Pain Assessment Pain Assessment: Faces Faces Pain Scale: Hurts whole lot Pain Location: R flank Pain Descriptors / Indicators: Sore Pain Intervention(s): Patient requesting pain meds-RN notified    Home Living Family/patient expects to be discharged to:: Private residence Living Arrangements: Children Available Help at Discharge: Family;Available PRN/intermittently Type of Home: House Home Access: Stairs to enter Entrance Stairs-Rails: Can reach both Entrance Stairs-Number of Steps: 2   Home Layout: One level Home Equipment: None  Prior Function Prior Level of Function : Independent/Modified Independent             Mobility Comments: independent without device       Hand Dominance   Dominant Hand: Right     Extremity/Trunk Assessment   Upper Extremity Assessment Upper Extremity Assessment: Overall WFL for tasks assessed    Lower Extremity Assessment Lower Extremity Assessment: Generalized weakness    Cervical / Trunk Assessment Cervical / Trunk Assessment: Other exceptions Cervical / Trunk Exceptions: drain x3 from abdomen  Communication   Communication: No difficulties  Cognition Arousal/Alertness: Awake/alert Behavior During Therapy: WFL for tasks assessed/performed Overall Cognitive Status: Within Functional Limits for tasks assessed                                          General Comments General comments (skin integrity, edema, etc.): VSS on RA    Exercises     Assessment/Plan    PT Assessment Patient needs continued PT services  PT Problem List Decreased strength;Decreased activity tolerance;Decreased balance;Decreased mobility;Decreased knowledge of use of DME;Pain       PT Treatment Interventions Gait training;DME instruction;Functional mobility training;Stair training;Therapeutic activities;Therapeutic exercise;Balance training;Neuromuscular re-education;Patient/family education    PT Goals (Current goals can be found in the Care Plan section)  Acute Rehab PT Goals Patient Stated Goal: to return to independence PT Goal Formulation: With patient Time For Goal Achievement: 02/25/23 Potential to Achieve Goals: Good Additional Goals Additional Goal #1: Pt will score >19/24 on the DGI to indicate a reduced risk for falls    Frequency Min 3X/week     Co-evaluation               AM-PAC PT "6 Clicks" Mobility  Outcome Measure Help needed turning from your back to your side while in a flat bed without using bedrails?: A Little Help needed moving from lying on your back to sitting on the side of a flat bed without using bedrails?: A Little Help needed moving to and from a bed to a chair (including a wheelchair)?: A Little Help needed  standing up from a chair using your arms (e.g., wheelchair or bedside chair)?: A Little Help needed to walk in hospital room?: A Little Help needed climbing 3-5 steps with a railing? : A Lot 6 Click Score: 17    End of Session   Activity Tolerance: Patient limited by pain Patient left: in bed;with call bell/phone within reach;with bed alarm set Nurse Communication: Mobility status PT Visit Diagnosis: Other abnormalities of gait and mobility (R26.89);Muscle weakness (generalized) (M62.81)    Time: [id number] PT Time Calculation (min) (ACUTE ONLY): 17 min   Charges:   PT Evaluation $PT Eval Low Complexity: [place], PT, DPT Acute Rehabilitation Office [phone]   Zenaida Niece 02/11/2023, 10:16 AM

## 2023-02-12 LAB — BASIC METABOLIC PANEL
Anion gap: 11 (ref 5–15)
BUN: 8 mg/dL (ref 8–23)
CO2: 23 mmol/L (ref 22–32)
Calcium: 7.7 mg/dL — ABNORMAL LOW (ref 8.9–10.3)
Chloride: 103 mmol/L (ref 98–111)
Creatinine, Ser: 0.71 mg/dL (ref 0.44–1.00)
GFR, Estimated: >60 mL/min (ref >60)
Glucose, Bld: 87 mg/dL (ref 70–99)
Potassium: 3.9 mmol/L (ref 3.5–5.1)
Sodium: 137 mmol/L (ref 135–145)

## 2023-02-12 LAB — CBC
HCT: 31.5 % — ABNORMAL LOW (ref 36.0–46.0)
Hemoglobin: 9.9 g/dL — ABNORMAL LOW (ref 12.0–15.0)
MCH: 28.5 pg (ref 26.0–34.0)
MCHC: 31.4 g/dL (ref 30.0–36.0)
MCV: 90.8 fL (ref 80.0–100.0)
Platelets: 388 10*3/uL (ref 150–400)
RBC: 3.47 MIL/uL — ABNORMAL LOW (ref 3.87–5.11)
RDW: 15.8 % — ABNORMAL HIGH (ref 11.5–15.5)
WBC: 11.5 10*3/uL — ABNORMAL HIGH (ref 4.0–10.5)
nRBC: 0 % (ref 0.0–0.2)

## 2023-02-12 MED ORDER — METOPROLOL TARTRATE 5 MG/5ML IV SOLN: 5.0000 mg | Freq: Four times a day (QID) | INTRAVENOUS | Status: DC | PRN

## 2023-02-12 NOTE — Progress Notes (Signed)
Mobility Specialist - Progress Note   02/12/23 1042  Mobility  Activity Ambulated with assistance in hallway  Level of Assistance Contact guard assist, steadying assist  Assistive Device Other (Comment) (iv pole)  Distance Ambulated (ft) 70 ft  Activity Response Tolerated well  Mobility Referral Yes  $Mobility charge 1 Mobility   Pt was received in bed and agreeable to mobility. Pt c/o center back pain long with general weakness. Pt was left in chair with all needs met and family present.   Franki Monte  Mobility Specialist Please contact via Solicitor or Rehab office at [phone]

## 2023-02-12 NOTE — Progress Notes (Signed)
Mobility Specialist - Progress Note   02/12/23 1622  Mobility  Activity Ambulated with assistance in hallway  Level of Assistance Contact guard assist, steadying assist  Assistive Device Other (Comment) (IV pole)  Distance Ambulated (ft) 70 ft  Activity Response Tolerated well  Mobility Referral Yes   Pt was received in bed and agreeable to mobility. Pt c/o weakness during session. Pt was returned to bed with all needs met.   Franki Monte  Mobility Specialist Please contact via Solicitor or Rehab office at [phone]

## 2023-02-12 NOTE — Progress Notes (Signed)
Patient ID: Monica White, female   DOB: 1941/10/28, 82 y.o.   MRN: [id number] 3 Days Post-Op    Subjective: Patients son Monica White is on the phone during my exam.   Patient rates her abdominal pain as 6/10, worse with getting out of bed. Repots flatus x4, denies BM. States she got OOB and to the bathroom with PT yesterday. She is hoping to walk with PT today.   Patients son tells me that Monica White recommended a TCTS consult for definitive repair of this patients hiatal hernia. She has an appointment in April at [facility] tells me that they are interested in getting an opinion from Monica White as well, and having this problem addressed in [place] if possible.   ROS negative except as listed above. Objective: Vital signs in last 24 hours: Temp:  [97.7 F (36.5 C)-98.3 F (36.8 C)] 98 F (36.7 C) (03/19 0800) Pulse Rate:  [88-99] 96 (03/19 0800) Resp:  [16-20] 19 (03/19 0800) BP: (149-159)/(79-88) 150/88 (03/19 0800) SpO2:  [92 %-95 %] 92 % (03/19 0800)    Intake/Output from previous day: 03/18 0701 - 03/19 0700 In: 1391.8 [I.V.:1351.8] Out: 350 [Drains:350] Intake/Output this shift: No intake/output data recorded.  General appearance: alert and cooperative Resp: clear to auscultation bilaterally GI: mild distention, G tube draining bilious effluent - 60 mL, JPs SS, soft  Lab Results: CBC  Recent Labs    02/11/23 0209 02/12/23 0241  WBC 14.3* 11.5*  HGB 10.3* 9.9*  HCT 32.0* 31.5*  PLT 390 388   BMET Recent Labs    02/11/23 0209 02/12/23 0241  NA 137 137  K 3.3* 3.9  CL 104 103  CO2 25 23  GLUCOSE 103* 87  BUN 12 8  CREATININE 0.67 0.71  CALCIUM 7.7* 7.7*   PT/INR No results for input(s): "LABPROT", "INR" in the last 72 hours. ABG No results for input(s): "PHART", "HCO3" in the last 72 hours.  Invalid input(s): "PCO2", "PO2"  Studies/Results:   Anti-infectives: Anti-infectives (From admission, onward)    Start     Dose/Rate Route  Frequency Ordered Stop   02/09/23 0719  metroNIDAZOLE (FLAGYL) 500 MG/100ML IVPB       Note to Pharmacy: Rejeana Brock L: cabinet override      02/09/23 0719 02/09/23 0758   02/09/23 0530  ceFEPIme (MAXIPIME) 2 g in sodium chloride 0.9 % 100 mL IVPB        2 g 200 mL/hr over 30 Minutes Intravenous  Once 02/09/23 0520 02/09/23 0820   02/09/23 0530  metroNIDAZOLE (FLAGYL) IVPB 500 mg        500 mg 100 mL/hr over 60 Minutes Intravenous  Once 02/09/23 0520 02/09/23 0736       Assessment/Plan: Recurrent hiatal hernia S/P upper endo, laparoscopic reduction and G tube placement by Monica White 3/16 -  - AFVSS, WBC 11 from 72 -  had some ischemic gastric mucosa, JPs SS, plan NPO and G tube to gravity until bowel function. The get UGI before starting PO  FEN - 1/2 NS + 20 mEq KCl @ 75 mL/hr, NPO; hypokalemia resolved (3.9 from 3.3), re-check in AM VTE - lovenox ID: cefepime/flagyl x 1 dose 3/16 Dispo - med surg   LOS: 3 days    Obie Dredge, PA-C  Trauma & General Surgery Use [email or web address] to contact on call provider  02/12/2023

## 2023-02-12 NOTE — Progress Notes (Signed)
  Transition of Care [facility]) Screening Note   Patient Details  Name: Monica White Date of Birth: 11/02/1941   Transition of Care [facility]) CM/SW Contact:    Cyndi Bender, RN Phone Number: 02/12/2023, 11:23 AM    Transition of Care Department [facility]) has reviewed patient and no TOC needs have been identified at this time. We will continue to monitor patient advancement through interdisciplinary progression rounds. If new patient transition needs arise, please place a TOC consult.

## 2023-02-13 ENCOUNTER — Inpatient Hospital Stay (HOSPITAL_COMMUNITY): Payer: Medicare Other

## 2023-02-13 ENCOUNTER — Other Ambulatory Visit: Payer: Self-pay

## 2023-02-13 LAB — GLUCOSE, CAPILLARY: Glucose-Capillary: 120 mg/dL — ABNORMAL HIGH (ref 70–99)

## 2023-02-13 LAB — CBC
HCT: 31.7 % — ABNORMAL LOW (ref 36.0–46.0)
Hemoglobin: 10.2 g/dL — ABNORMAL LOW (ref 12.0–15.0)
MCH: 29 pg (ref 26.0–34.0)
MCHC: 32.2 g/dL (ref 30.0–36.0)
MCV: 90.1 fL (ref 80.0–100.0)
Platelets: 424 10*3/uL — ABNORMAL HIGH (ref 150–400)
RBC: 3.52 MIL/uL — ABNORMAL LOW (ref 3.87–5.11)
RDW: 15.3 % (ref 11.5–15.5)
WBC: 7.4 10*3/uL (ref 4.0–10.5)
nRBC: 0 % (ref 0.0–0.2)

## 2023-02-13 LAB — PHOSPHORUS: Phosphorus: 2.1 mg/dL — ABNORMAL LOW (ref 2.5–4.6)

## 2023-02-13 LAB — BASIC METABOLIC PANEL
Anion gap: 9 (ref 5–15)
BUN: 7 mg/dL — ABNORMAL LOW (ref 8–23)
CO2: 22 mmol/L (ref 22–32)
Calcium: 7.5 mg/dL — ABNORMAL LOW (ref 8.9–10.3)
Chloride: 104 mmol/L (ref 98–111)
Creatinine, Ser: 0.61 mg/dL (ref 0.44–1.00)
GFR, Estimated: >60 mL/min (ref >60)
Glucose, Bld: 85 mg/dL (ref 70–99)
Potassium: 3.7 mmol/L (ref 3.5–5.1)
Sodium: 135 mmol/L (ref 135–145)

## 2023-02-13 LAB — MAGNESIUM: Magnesium: 2 mg/dL (ref 1.7–2.4)

## 2023-02-13 MED ORDER — POTASSIUM CHLORIDE IN NACL 20-0.9 MEQ/L-% IV SOLN
INTRAVENOUS | Status: DC
  Filled 2023-02-13: qty 1000

## 2023-02-13 MED ORDER — TRAVASOL 10 % IV SOLN
INTRAVENOUS | Status: AC
  Filled 2023-02-13: qty 453.6

## 2023-02-13 MED ORDER — SODIUM CHLORIDE 0.9 % IV SOLN: INTRAVENOUS | Status: DC

## 2023-02-13 MED ORDER — POTASSIUM PHOSPHATES 15 MMOLE/5ML IV SOLN
25.0000 mmol | Freq: Once | INTRAVENOUS | Status: AC
  Administered 2023-02-13: 25 mmol via INTRAVENOUS
  Filled 2023-02-13: qty 8.33

## 2023-02-13 MED ORDER — INSULIN ASPART 100 UNIT/ML IJ SOLN
0.0000 [IU] | Freq: Four times a day (QID) | INTRAMUSCULAR | Status: DC
  Administered 2023-02-14 – 2023-02-15 (×4): 1 [IU] via SUBCUTANEOUS

## 2023-02-13 MED ORDER — SODIUM CHLORIDE 0.9% FLUSH: 10.0000 mL | INTRAVENOUS | Status: DC | PRN

## 2023-02-13 MED ORDER — SODIUM CHLORIDE 0.9% FLUSH
10.0000 mL | Freq: Two times a day (BID) | INTRAVENOUS | Status: DC
  Administered 2023-02-13 – 2023-02-18 (×11): 10 mL

## 2023-02-13 MED ORDER — CHLORHEXIDINE GLUCONATE CLOTH 2 % EX PADS
6.0000 | MEDICATED_PAD | Freq: Every day | CUTANEOUS | Status: DC
  Administered 2023-02-13 – 2023-02-19 (×7): 6 via TOPICAL

## 2023-02-13 NOTE — Progress Notes (Signed)
     [id number]       [place] [address]             [phone]       Consult received, full note to follow. Awaiting further discussion with Dr. Kieth Brightly.  Continue current management for now  [employer]

## 2023-02-13 NOTE — Progress Notes (Signed)
Peripherally Inserted Central Catheter Placement  The IV Nurse has discussed with the patient and/or persons authorized to consent for the patient, the purpose of this procedure and the potential benefits and risks involved with this procedure.  The benefits include less needle sticks, lab draws from the catheter, and the patient may be discharged home with the catheter. Risks include, but not limited to, infection, bleeding, blood clot (thrombus formation), and puncture of an artery; nerve damage and irregular heartbeat and possibility to perform a PICC exchange if needed/ordered by physician.  Alternatives to this procedure were also discussed.  Bard Power PICC patient education guide, fact sheet on infection prevention and patient information card has been provided to patient /or left at bedside.    PICC Placement Documentation  PICC Double Lumen [id number] Right Basilic 39 cm 1 cm (Active)  Indication for Insertion or Continuance of Line Administration of hyperosmolar/irritating solutions (i.e. TPN, Vancomycin, etc.) 02/13/23 1517  Exposed Catheter (cm) 1 cm 02/13/23 1517  Site Assessment Clean, Dry, Intact 02/13/23 1517  Lumen #1 Status Flushed;Saline locked;Blood return noted 02/13/23 1517  Lumen #2 Status Flushed;Saline locked;Blood return noted 02/13/23 1517  Dressing Type Transparent;Securing device 02/13/23 1517  Dressing Status Antimicrobial disc in place;Clean, Dry, Intact 02/13/23 1517  Safety Lock Not Applicable [id number]  Line Care Connections checked and tightened 02/13/23 1517  Line Adjustment (NICU/IV Team Only) No 02/13/23 1517  Dressing Intervention New dressing;Other (Comment) 02/13/23 1517  Dressing Change Due 02/20/23 02/13/23 1517       Enos Fling 02/13/2023, 3:19 PM

## 2023-02-13 NOTE — Progress Notes (Addendum)
PHARMACY - TOTAL PARENTERAL NUTRITION CONSULT NOTE  Indication:  Prolonged NPO status  Patient Measurements: Height: 5\' 7"  (170.2 cm) Weight: 59 kg (130 lb) IBW/kg (Calculated) : 61.6 TPN AdjBW (KG): 59 Body mass index is 20.36 kg/m. Usual Weight: 62 kg  Assessment:  Pt presenting to [facility] on 3/16 for chest pain, vomiting and hernia symptoms. Pt underwent laparoscopic paraesophageal hernia repair with Toupet fundoplication on 0000000. Presented to ED on 11/02/22 with recurrent large hiatal hernia and underwent robotic reduction of hernia with G tube insertion. It was unable to be fully repaired due to scar tissue from previous surgery. G tube was dislodged on 11/12/22. Pt failed multiple attempts to decompress stomach with NG tube and underwent upper endoscopy with decompression of gastric volvulus and g tube placement. She has had minimal flatus passage and has been without nutrition for about 4 days and expected prolonged NPO status.  Pharmacy consulted to dose TPN.  Pt was eating a full regular diet PTA on 3/16 and her last full meal was the night of the 15th.  She typically eats 3 meals per day with a light snack. She drinks lifelyte nutrition supplements at home and typically drinks 1 a day. She does not get a good mix of veggies at home but does eat a lot of yogurt. She has had multiple GI surgeries and has lost weight since then. Based on chart review and pt account she was about 145 lbs before initial hernia repair and 136 pounds after subsequent admission and hernia surgery.   Glucose / Insulin: No hx DM, A1c 5.5 in 2018 Electrolytes: Phos 2.1, others WNL (Na and K low end of normal) Renal: Scr <1, BUN WNL Hepatic: CMP 3/21 Intake / Output; MIVF: UOP not documented, 55 mL drain, 0.45 NS with KCl 20 mEq/L @ 75 mL/hr GI Imaging: none since start of TPN GI Surgeries / Procedures: none since start of TPN  Central access: PICC to be placed today TPN start date: 02/13/23  Nutritional  Goals: Goal TPN rate is 70 mL/hr (provides 90 g of protein, 236 g CHO and 1666 kcals per day)  RD Assessment: pending    Current Nutrition:  NPO  Plan:  Start TPN at 79mL/hr at 1800, providing 45g AA, 118 g CHO and 833 kCal, meeting 50% of needs Electrolytes in TPN: Na 48mEq/L, K 55mEq/L, Ca 42mEq/L, Mg 96mEq/L, and Phos 69mmol/L. Cl:Ac 1:1 Add standard MVI and trace elements to TPN Initiate Sensitive q6h SSI and adjust as needed  Change MIVF to NS 20K and reduce to 40 mL/hr at 1800 Give potassium phosphate 25 mmol in addition to TPN Monitor TPN labs on Mon/Thurs  Timothy Niblock 02/13/2023,10:58 AM

## 2023-02-13 NOTE — TOC Progression Note (Signed)
Transition of Care [facility]) - Progression Note    Patient Details  Name: Monica White MRN: [id number] Date of Birth: [birth year]  Transition of Care [facility]) CM/SW Contact  Cyndi Bender, RN Phone Number: 02/13/2023, 3:32 PM  Clinical Narrative:      Plan for UGI in the next couple of days. PICC/ TPN for nutrition. TOC following.       Expected Discharge Plan and Services                                               Social Determinants of Health (SDOH) Interventions SDOH Screenings   Food Insecurity: No Food Insecurity (11/05/2022)  Housing: Low Risk  (11/05/2022)  Transportation Needs: No Transportation Needs (11/05/2022)  Utilities: Not At Risk (11/05/2022)  Tobacco Use: Low Risk  (02/10/2023)    Readmission Risk Interventions     No data to display

## 2023-02-13 NOTE — Progress Notes (Signed)
Physical Therapy Treatment Patient Details Name: Monica White MRN: [id number] DOB: [birth year] Today's Date: 02/13/2023   History of Present Illness 82 y.o. female presents to [facility] hospital on 02/09/2023 with chest pain, vomiting. Pt admitted for management of gastric volvulus due to recurrent hiatal hernia. Pt underwent upper endoscopy and laparoscopic reduction with G tube placement on 3/16. PMH includes anemia, OA, DOE, GERD.    PT Comments    Pt greeted supine in bed and eager for mobility with continued progress towards acute goals. Pt needing grossly min guard assist for OOB mobility without external support this session with focus on breathing techniques during gait as pt with noted DOE 3/4 while walking and talking. Pt continues to be limited by weakness and decreased activity tolerance, needing extended seated recovery mid way through gait. Encouraged continued frequent mobilization and IS use with pt verbalizing understanding. Pt continues to benefit from skilled PT services to progress toward functional mobility goals.     Recommendations for follow up therapy are one component of a multi-disciplinary discharge planning process, led by the attending physician.  Recommendations may be updated based on patient status, additional functional criteria and insurance authorization.  Follow Up Recommendations  No PT follow up     Assistance Recommended at Discharge PRN  Patient can return home with the following A little help with bathing/dressing/bathroom;Assistance with cooking/housework;Assist for transportation;Help with stairs or ramp for entrance   Equipment Recommendations  None recommended by PT    Recommendations for Other Services       Precautions / Restrictions Precautions Precautions: Fall Precaution Comments: JP x2, gastric drain Restrictions Weight Bearing Restrictions: No     Mobility  Bed Mobility Overal bed mobility: Needs Assistance Bed Mobility: Rolling,  Sidelying to Sit Rolling: Supervision Sidelying to sit: Supervision       General bed mobility comments: supervision for safety    Transfers Overall transfer level: Needs assistance Equipment used: None Transfers: Sit to/from Stand Sit to Stand: Min guard           General transfer comment: steady power up from EOB and toilet    Ambulation/Gait Ambulation/Gait assistance: Min guard Gait Distance (Feet): 140 Feet (x2) Assistive device: None Gait Pattern/deviations: Step-through pattern, Drifts right/left Gait velocity: reduced     General Gait Details: slowed step-through gait with reduced stride length, minimal foot clearance, guarding abdomen throughout, cues for less conversation and increased focus no breath as pt with DOE 3/4 when walking and talking, much improved with cues   Stairs             Wheelchair Mobility    Modified Rankin (Stroke Patients Only)       Balance Overall balance assessment: Needs assistance Sitting-balance support: No upper extremity supported, Feet supported Sitting balance-Leahy Scale: Good     Standing balance support: No upper extremity supported, During functional activity Standing balance-Leahy Scale: Fair Standing balance comment: able to ambualte without UE support wth min guard as pt with some drift R/L and mild instability                            Cognition Arousal/Alertness: Awake/alert Behavior During Therapy: WFL for tasks assessed/performed Overall Cognitive Status: Within Functional Limits for tasks assessed  Exercises      General Comments General comments (skin integrity, edema, etc.): VSS on RA, pursed lip breathing encouraged throughout as pt with c/o SOB when walking and talking      Pertinent Vitals/Pain Pain Assessment Pain Assessment: Faces Faces Pain Scale: Hurts little more Pain Location: R flank Pain Descriptors /  Indicators: Sore Pain Intervention(s): Monitored during session, Limited activity within patient's tolerance, Repositioned    Home Living                          Prior Function            PT Goals (current goals can now be found in the care plan section) Acute Rehab PT Goals Patient Stated Goal: to return to independence PT Goal Formulation: With patient Time For Goal Achievement: 02/25/23 Progress towards PT goals: Progressing toward goals    Frequency    Min 3X/week      PT Plan      Co-evaluation              AM-PAC PT "6 Clicks" Mobility   Outcome Measure  Help needed turning from your back to your side while in a flat bed without using bedrails?: A Little Help needed moving from lying on your back to sitting on the side of a flat bed without using bedrails?: A Little Help needed moving to and from a bed to a chair (including a wheelchair)?: A Little Help needed standing up from a chair using your arms (e.g., wheelchair or bedside chair)?: A Little Help needed to walk in hospital room?: A Little Help needed climbing 3-5 steps with a railing? : A Lot 6 Click Score: 17    End of Session   Activity Tolerance: Patient tolerated treatment well Patient left: with call bell/phone within reach;in chair Nurse Communication: Mobility status PT Visit Diagnosis: Other abnormalities of gait and mobility (R26.89);Muscle weakness (generalized) (M62.81)     Time: [id number] PT Time Calculation (min) (ACUTE ONLY): 28 min  Charges:  $Therapeutic Activity: 23-37 mins                     Allahna Husband R. PTA Acute Rehabilitation Services Office: [place] 02/13/2023, 9:26 AM

## 2023-02-13 NOTE — Progress Notes (Addendum)
Patient ID: Monica White, female   DOB: 06-03-1941, 82 y.o.   MRN: [id number] 4 Days Post-Op    Subjective: Patients son Monica White is on the phone during my exam.   She states pain is better today with close to none at rest. Still hurst with movement. Amount of flatus is about the same - about 2-3 episodes. No BM. She did ambulate more yesterday. Has [place] on this am but denies SHOB  Objective: Vital signs in last 24 hours: Temp:  [97.9 F (36.6 C)-98.8 F (37.1 C)] 97.9 F (36.6 C) (03/20 0427) Pulse Rate:  [84-96] 84 (03/20 0427) Resp:  [18-19] 18 (03/20 0427) BP: (120-150)/(79-92) 149/79 (03/20 0427) SpO2:  [91 %-93 %] 92 % (03/20 0427) Last BM Date : 02/09/23  Intake/Output from previous day: 03/19 0701 - 03/20 0700 In: 1894.1 [I.V.:1894.1] Out: 270 [Drains:270] Intake/Output this shift: No intake/output data recorded.  General appearance: alert and cooperative Resp: normal work of breathing on room air. Pulls 500 on IS GI: mild distention, G tube draining bilious effluent - 125 mL/24h, JPs SS, soft, incisions c/d/I. NT  Lab Results: CBC  Recent Labs    02/12/23 0241 02/13/23 0231  WBC 11.5* 7.4  HGB 9.9* 10.2*  HCT 31.5* 31.7*  PLT 388 424*    BMET Recent Labs    02/12/23 0241 02/13/23 0231  NA 137 135  K 3.9 3.7  CL 103 104  CO2 23 22  GLUCOSE 87 85  BUN 8 7*  CREATININE 0.71 0.61  CALCIUM 7.7* 7.5*    PT/INR No results for input(s): "LABPROT", "INR" in the last 72 hours. ABG No results for input(s): "PHART", "HCO3" in the last 72 hours.  Invalid input(s): "PCO2", "PO2"  Studies/Results:   Anti-infectives: Anti-infectives (From admission, onward)    Start     Dose/Rate Route Frequency Ordered Stop   02/09/23 0719  metroNIDAZOLE (FLAGYL) 500 MG/100ML IVPB       Note to Pharmacy: Rejeana Brock L: cabinet override      02/09/23 0719 02/09/23 0758   02/09/23 0530  ceFEPIme (MAXIPIME) 2 g in sodium chloride 0.9 % 100 mL IVPB        2 g 200 mL/hr over  30 Minutes Intravenous  Once 02/09/23 0520 02/09/23 0820   02/09/23 0530  metroNIDAZOLE (FLAGYL) IVPB 500 mg        500 mg 100 mL/hr over 60 Minutes Intravenous  Once 02/09/23 0520 02/09/23 0736       Assessment/Plan: Recurrent hiatal hernia POD4 S/P upper endo, laparoscopic reduction and G tube placement by Dr. Thermon Leyland 3/16 -  - AFVSS, WBC 7.4 -  had some ischemic gastric mucosa, JPs SS, plan NPO and G tube to gravity until more bowel function then get UGI before starting PO. She is only passing minimal flatus - given 4 days without nutrition discussed the possibility of PICC/TPN soon. Will discuss timing with MD - wean O2 as able. Encouraged ambulation and IS use  FEN - 1/2 NS + 20 mEq KCl @ 75 mL/hr, NPO; hypokalemia resolved VTE - lovenox ID: cefepime/flagyl x 1 dose 3/16 Dispo - med surg  Hypothyroidism - home levothyroxine held while NPO Hypercholesteremia GERD Arthritis   LOS: 4 days    Winferd Humphrey, [facility] 02/13/2023, 7:43 AM Please see Amion for pager number during day hours 7:00am-4:30pm   02/13/2023

## 2023-02-14 ENCOUNTER — Inpatient Hospital Stay (HOSPITAL_COMMUNITY): Payer: Medicare Other

## 2023-02-14 LAB — COMPREHENSIVE METABOLIC PANEL
ALT: 14 U/L (ref 0–44)
AST: 13 U/L — ABNORMAL LOW (ref 15–41)
Albumin: 2.2 g/dL — ABNORMAL LOW (ref 3.5–5.0)
Alkaline Phosphatase: 67 U/L (ref 38–126)
Anion gap: 6 (ref 5–15)
BUN: 6 mg/dL — ABNORMAL LOW (ref 8–23)
CO2: 25 mmol/L (ref 22–32)
Calcium: 7.8 mg/dL — ABNORMAL LOW (ref 8.9–10.3)
Chloride: 106 mmol/L (ref 98–111)
Creatinine, Ser: 0.57 mg/dL (ref 0.44–1.00)
GFR, Estimated: >60 mL/min (ref >60)
Glucose, Bld: 150 mg/dL — ABNORMAL HIGH (ref 70–99)
Potassium: 3.6 mmol/L (ref 3.5–5.1)
Sodium: 137 mmol/L (ref 135–145)
Total Bilirubin: 0.6 mg/dL (ref 0.3–1.2)
Total Protein: 5 g/dL — ABNORMAL LOW (ref 6.5–8.1)

## 2023-02-14 LAB — CBC
HCT: 33.3 % — ABNORMAL LOW (ref 36.0–46.0)
Hemoglobin: 10.6 g/dL — ABNORMAL LOW (ref 12.0–15.0)
MCH: 28.3 pg (ref 26.0–34.0)
MCHC: 31.8 g/dL (ref 30.0–36.0)
MCV: 89 fL (ref 80.0–100.0)
Platelets: 457 10*3/uL — ABNORMAL HIGH (ref 150–400)
RBC: 3.74 MIL/uL — ABNORMAL LOW (ref 3.87–5.11)
RDW: 15.2 % (ref 11.5–15.5)
WBC: 7.3 10*3/uL (ref 4.0–10.5)
nRBC: 0 % (ref 0.0–0.2)

## 2023-02-14 LAB — CULTURE, BLOOD (ROUTINE X 2)
Culture: NO GROWTH
Culture: NO GROWTH
Special Requests: ADEQUATE
Special Requests: ADEQUATE

## 2023-02-14 LAB — PHOSPHORUS: Phosphorus: 2.5 mg/dL (ref 2.5–4.6)

## 2023-02-14 LAB — MAGNESIUM: Magnesium: 2 mg/dL (ref 1.7–2.4)

## 2023-02-14 LAB — GLUCOSE, CAPILLARY
Glucose-Capillary: 138 mg/dL — ABNORMAL HIGH (ref 70–99)
Glucose-Capillary: 136 mg/dL — ABNORMAL HIGH (ref 70–99)
Glucose-Capillary: 131 mg/dL — ABNORMAL HIGH (ref 70–99)
Glucose-Capillary: 146 mg/dL — ABNORMAL HIGH (ref 70–99)

## 2023-02-14 LAB — TRIGLYCERIDES: Triglycerides: 105 mg/dL (ref <150)

## 2023-02-14 MED ORDER — POTASSIUM CHLORIDE 10 MEQ/50ML IV SOLN
10.0000 meq | INTRAVENOUS | Status: AC
  Administered 2023-02-14 (×2): 10 meq via INTRAVENOUS
  Filled 2023-02-14 (×2): qty 50

## 2023-02-14 MED ORDER — SODIUM CHLORIDE 0.9 % IV SOLN: INTRAVENOUS | Status: DC

## 2023-02-14 MED ORDER — POTASSIUM CHLORIDE IN NACL 20-0.9 MEQ/L-% IV SOLN: INTRAVENOUS | Status: AC

## 2023-02-14 MED ORDER — TRAVASOL 10 % IV SOLN
INTRAVENOUS | Status: AC
  Filled 2023-02-14: qty 907.2

## 2023-02-14 MED ORDER — IOHEXOL 300 MG/ML  SOLN
100.0000 mL | Freq: Once | INTRAMUSCULAR | Status: AC | PRN
  Administered 2023-02-14: 100 mL via ORAL

## 2023-02-14 MED ORDER — POTASSIUM PHOSPHATES 15 MMOLE/5ML IV SOLN
15.0000 mmol | Freq: Once | INTRAVENOUS | Status: AC
  Administered 2023-02-14: 15 mmol via INTRAVENOUS
  Filled 2023-02-14: qty 5

## 2023-02-14 NOTE — Progress Notes (Signed)
Patient ID: Monica White, female   DOB: 09/15/1941, 82 y.o.   MRN: [id number] 5 Days Post-Op    Subjective: Patients son Monica White is on the phone during my exam.   Pain is still better though she is more stiff this morning. Bowel function is the same as yesterday - passing intermittent gas. She denies nausea or feeling bloated. Ambulated yesterday. SHOB stable and she is not on o2 this am  Objective: Vital signs in last 24 hours: Temp:  [97.8 F (36.6 C)-98.3 F (36.8 C)] 98 F (36.7 C) (03/21 0500) Pulse Rate:  [77-81] 78 (03/21 0500) Resp:  [16-19] 16 (03/21 0500) BP: (149-167)/(77-94) 158/77 (03/21 0500) SpO2:  [91 %-94 %] 91 % (03/21 0500) Last BM Date : 02/09/23  Intake/Output from previous day: 03/20 0701 - 03/21 0700 In: 882.6 [I.V.:882.6] Out: 90 [Drains:90] Intake/Output this shift: Total I/O In: -  Out: 40 [Drains:40]  General appearance: alert and cooperative Resp: normal work of breathing on room air. Pulls 500 on IS GI: no distention, G tube with scant bilious effluent - 0 mL/24h charted, JPs SS, soft, incisions c/d/I. NT  Lab Results: CBC  Recent Labs    02/13/23 0231 02/14/23 0401  WBC 7.4 7.3  HGB 10.2* 10.6*  HCT 31.7* 33.3*  PLT 424* 457*    BMET Recent Labs    02/13/23 0231 02/14/23 0401  NA 135 137  K 3.7 3.6  CL 104 106  CO2 22 25  GLUCOSE 85 150*  BUN 7* 6*  CREATININE 0.61 0.57  CALCIUM 7.5* 7.8*    PT/INR No results for input(s): "LABPROT", "INR" in the last 72 hours. ABG No results for input(s): "PHART", "HCO3" in the last 72 hours.  Invalid input(s): "PCO2", "PO2"  Studies/Results:   Anti-infectives: Anti-infectives (From admission, onward)    Start     Dose/Rate Route Frequency Ordered Stop   02/09/23 0719  metroNIDAZOLE (FLAGYL) 500 MG/100ML IVPB       Note to Pharmacy: Rejeana Brock L: cabinet override      02/09/23 0719 02/09/23 0758   02/09/23 0530  ceFEPIme (MAXIPIME) 2 g in sodium chloride 0.9 % 100 mL IVPB         2 g 200 mL/hr over 30 Minutes Intravenous  Once 02/09/23 0520 02/09/23 0820   02/09/23 0530  metroNIDAZOLE (FLAGYL) IVPB 500 mg        500 mg 100 mL/hr over 60 Minutes Intravenous  Once 02/09/23 0520 02/09/23 0736       Assessment/Plan: Recurrent hiatal hernia POD5 S/P upper endo, laparoscopic reduction and G tube placement by Dr. Thermon Leyland 3/16  - AFVSS, WBC normalized -  had some ischemic gastric mucosa, JPs SS. Continue g tube to gravity for now - no BM but continues to pass flatus with reassuring labs and exam. Will plan for UGI today - TCTS consulted - appreciate their evaluation - PICC/TPN started 3/20 - Encouraged ambulation and IS use  FEN - NPO, G tube to gravity. 1/2 NS + 20 mEq KCl @ 75 mL/hr VTE - lovenox ID: cefepime/flagyl x 1 dose 3/16 Dispo - med surg  Hypothyroidism - home levothyroxine held while NPO Hypercholesteremia GERD Arthritis   LOS: 5 days    Winferd Humphrey, [facility] 02/14/2023, 8:13 AM Please see Amion for pager number during day hours 7:00am-4:30pm   02/14/2023

## 2023-02-14 NOTE — Progress Notes (Signed)
Physical Therapy Treatment Patient Details Name: Monica White MRN: [id number] DOB: 1941-10-11 Today's Date: 02/14/2023   History of Present Illness 82 y.o. female [facility] to [facility] hospital on 02/09/2023 with chest pain, vomiting. Pt admitted for management of gastric volvulus due to recurrent hiatal hernia. Pt underwent upper endoscopy and laparoscopic reduction with G tube placement on 3/16. PMH includes anemia, OA, DOE, GERD.    PT Comments    Pt greeted up in room with RN and agreeable to session, however pt limited by increased fatigue this date with pt stating she feels "exhausted". Pt continues to require grossly min guard assist without AD support for functional transfers and gait with distance limited this session due to fatigue. Pt with fair tolerance for dynamic balance challenges for improved balance/postural reactions, however limited due to fatigue and pt stated tolerance. Continued to encourage frequent mobilization and IS use as pt continues to report SOB and pain with inspiration, with pt verbalizing understanding.  Pt continues to benefit from skilled PT services to progress toward functional mobility goals.    Recommendations for follow up therapy are one component of a multi-disciplinary discharge planning process, led by the attending physician.  Recommendations may be updated based on patient status, additional functional criteria and insurance authorization.  Follow Up Recommendations  No PT follow up     Assistance Recommended at Discharge PRN  Patient can return home with the following A little help with bathing/dressing/bathroom;Assistance with cooking/housework;Assist for transportation;Help with stairs or ramp for entrance   Equipment Recommendations  None recommended by PT    Recommendations for Other Services       Precautions / Restrictions Precautions Precautions: Fall Precaution Comments: JP x2, gastric drain, TPN Restrictions Weight Bearing Restrictions:  No     Mobility  Bed Mobility Overal bed mobility: Needs Assistance Bed Mobility: Sit to Supine       Sit to supine: Min guard   General bed mobility comments: min guard for safety    Transfers Overall transfer level: Needs assistance Equipment used: None Transfers: Sit to/from Stand Sit to Stand: Min guard           General transfer comment: controlled descent to EOB    Ambulation/Gait Ambulation/Gait assistance: Min guard Gait Distance (Feet): 200 Feet Assistive device: None Gait Pattern/deviations: Step-through pattern, Drifts right/left Gait velocity: reduced     General Gait Details: very slowed step-through gait with reduced stride length, minimal foot clearance and guarding abdomen throughout, pt with good recall for less conversation and focus on breath. distance limited to pt stted tolerane due to increased fatigue/exhaustion this date   Marine scientist [place] (Stroke Patients Only)       Balance Overall balance assessment: Needs assistance Sitting-balance support: No upper extremity supported, Feet supported Sitting balance-Leahy Scale: Good     Standing balance support: No upper extremity supported, During functional activity Standing balance-Leahy Scale: Fair Standing balance comment: able to ambualte without UE support wth min guard as pt with some drift R/L and mild instability             High level balance activites: Side stepping, Backward walking High Level Balance Comments: very slow short and low retro steps needing cues for larger amplitude no LOB, short side steps to R no LOB but pt indicating increased knee pain with side stepping            Cognition  Arousal/Alertness: Awake/alert Behavior During Therapy: WFL for tasks assessed/performed Overall Cognitive Status: Within Functional Limits for tasks assessed                                 General Comments: pt with  increased fatigue this date, stating she did not sleep well and is hungry        Exercises Other Exercises Other Exercises: IS x10 with cues for longer inspiration and larger amplitude of breath, able to pull to 677mL x2 others below 563mL Other Exercises: retro stepping, side stepping for balance challenge and improved balance/postrual reactions    General Comments General comments (skin integrity, edema, etc.): VSS on RA      Pertinent Vitals/Pain Pain Assessment Pain Assessment: Faces Faces Pain Scale: Hurts even more Pain Location: R flank, bil knees Pain Descriptors / Indicators: Sore Pain Intervention(s): Monitored during session, Limited activity within patient's tolerance, Repositioned    Home Living                          Prior Function            PT Goals (current goals can now be found in the care plan section) Acute Rehab PT Goals Patient Stated Goal: to feel better PT Goal Formulation: With patient Time For Goal Achievement: 02/25/23 Progress towards PT goals: Progressing toward goals    Frequency    Min 3X/week      PT Plan      Co-evaluation              AM-PAC PT "6 Clicks" Mobility   Outcome Measure  Help needed turning from your back to your side while in a flat bed without using bedrails?: A Little Help needed moving from lying on your back to sitting on the side of a flat bed without using bedrails?: A Little Help needed moving to and from a bed to a chair (including a wheelchair)?: A Little Help needed standing up from a chair using your arms (e.g., wheelchair or bedside chair)?: A Little Help needed to walk in hospital room?: A Little Help needed climbing 3-5 steps with a railing? : A Lot 6 Click Score: 17    End of Session   Activity Tolerance: Patient tolerated treatment well Patient left: with call bell/phone within reach;in bed Nurse Communication: Mobility status PT Visit Diagnosis: Other abnormalities of gait  and mobility (R26.89);Muscle weakness (generalized) (M62.81)     Time: [id number] PT Time Calculation (min) (ACUTE ONLY): 13 min  Charges:  $Therapeutic Activity: 8-22 mins                     Chanika Byland R. PTA Acute Rehabilitation Services Office: [place] 02/14/2023, 11:23 AM

## 2023-02-14 NOTE — Progress Notes (Signed)
Initial Nutrition Assessment  DOCUMENTATION CODES:   Non-severe (moderate) malnutrition in context of chronic illness  INTERVENTION:  TPN management per pharmacy Request updated measured weight   NUTRITION DIAGNOSIS:   Moderate Malnutrition related to chronic illness (recurrent hiatal hernia requiring multiple procedures) as evidenced by mild fat depletion, severe muscle depletion.  GOAL:   Patient will meet greater than or equal to 90% of their needs  MONITOR:   Labs, Weight trends, Diet advancement  REASON FOR ASSESSMENT:   Consult New TPN/TNA  ASSESSMENT:   Pt admitted with CP, vomiting and hiatal hernia symptoms, found to have gastric volvulus. PMH significant for lap paraesophageal hernia repair with Toupet fundoplication (Q000111Q), robotic reduction of hiatal hernia with lap G tube insertion (11/06/22) which was dislodged (11/12/22).   3/16 - s/p upper endo, lap reduction and G tube placement to gravity 3/20 - s/p PICC line placement, TPN initiation  Surgery plans for UGI. Eventual plans for definitive repair of hiatal hernia once fully recovered.   Spoke with pt at bedside. No family present at time of visit.   Pt recalls having lost her husband over a year ago. Since then she reports knowing that she has not been eating as well as she should. Prior to his passing, they were seeing a nutritionist that was helping with healthy eating habits.   Within the last year she recalls having 3 surgeries. Because of this she has intermittently required restricted diets. Liquids x2 weeks following first surgery and then a soft diet. Clear liquids 5 days leading up to surgery in December.   Her baseline PO intake following her surgery in December has been 3 meals per day. She recalls eating cereal or protein shake with yogurt. There is a drive-thru breakfast place that she likes to go to for lunch and order breakfast foods including toast, eggs and she does not always eat the  bacon. She usually eats an early supper which varies.   Friday, prior to admission, was her last normal day of eating. She recalls having her typical intake of yogurt with protein shakes, "probably" her typical breakfast for lunch and spaghetti with meatballs and apple sauce. Just following her dinner meal around 8pm, she states is when her symptoms began. She was feeling nauseous, gagging, and spitting up phlegm.   She really enjoys golfing but states that she has been so winded lately that she has been unable to walk but a few feet.   Pt states that her weight just prior to her surgery in April 2023 was 150 lbs. She states that her most recent known weight was 130 lbs. She is uncertain what it is now. Spoke with RN present during visit who will obtain updated measured weight when she mobilizes pt.   Reviewed weight history. Pt's weight on 12/07/21 was noted to be 67.8 kg. Her weight appears to gradually decline between that date and December. Unfortunately there is no documented weight history on file between December and this admission to review. Noted to have had a 10.3% weight loss within the last year which is not clinically significant for time frame.   Current updated weight today is 57 kg. Will continue to monitor throughout admission.   Medications: SSI 0-9 units q6h, protonix IV drips: NaCl with KCl @ 62ml/hr  Labs: BUN 6, TG's 105 (wdl), CBG's 138, 120 x24 hours  NUTRITION - FOCUSED PHYSICAL EXAM:  Flowsheet Row Most Recent Value  Orbital Region Mild depletion  Upper Arm Region Severe depletion  Thoracic and Lumbar Region Mild depletion  Buccal Region Mild depletion  Temple Region Moderate depletion  Clavicle Bone Region Severe depletion  Clavicle and Acromion Bone Region Severe depletion  Scapular Bone Region Severe depletion  Dorsal Hand Severe depletion  Patellar Region Severe depletion  Anterior Thigh Region Severe depletion  Posterior Calf Region Severe depletion  Edema  (RD Assessment) None  Hair Reviewed  Eyes Reviewed  Mouth Reviewed  Skin Reviewed  Nails Reviewed       Diet Order:   Diet Order             Diet NPO time specified Except for: Other (See Comments), Ice Chips  Diet effective now                   EDUCATION NEEDS:   No education needs have been identified at this time  Skin:  Skin Assessment: Reviewed RN Assessment (abdominal incision (closed))  Last BM:  3/16 Having flatus, no BM per Surgery (3/21)  Height:   Ht Readings from Last 1 Encounters:  02/09/23 5\' 7"  (1.702 m)    Weight:   Wt Readings from Last 1 Encounters:  02/14/23 57 kg   BMI:  Body mass index is 19.69 kg/m.  Estimated Nutritional Needs:   Kcal:  1700-1900  Protein:  85-100g  Fluid:  >/=1.7L  Clayborne Dana, RDN, LDN Clinical Nutrition

## 2023-02-14 NOTE — Progress Notes (Addendum)
PHARMACY - TOTAL PARENTERAL NUTRITION CONSULT NOTE  Indication:  Prolonged NPO status  Patient Measurements: Height: 5\' 7"  (170.2 cm) Weight: 59 kg (130 lb) IBW/kg (Calculated) : 61.6 TPN AdjBW (KG): 59 Body mass index is 20.36 kg/m. Usual Weight: 62 kg  Assessment:  Pt presenting to [facility] on 3/16 for chest pain, vomiting and hernia symptoms. Pt underwent laparoscopic paraesophageal hernia repair with Toupet fundoplication on 0000000. Presented to ED on 11/02/22 with recurrent large hiatal hernia and underwent robotic reduction of hernia with G tube insertion. It was unable to be fully repaired due to scar tissue from previous surgery. G tube was dislodged on 11/12/22. Pt failed multiple attempts to decompress stomach with NG tube and underwent upper endoscopy with decompression of gastric volvulus and g tube placement. She has had minimal flatus passage and has been without nutrition for about 4 days and expected prolonged NPO status.  Pharmacy consulted to dose TPN.  Pt was eating a full regular diet PTA on 3/16 and her last full meal was the night of the 15th.  She typically eats 3 meals per day with a light snack. She drinks lifelyte nutrition supplements at home and typically drinks 1 a day. She does not get a good mix of veggies at home but does eat a lot of yogurt. She has had multiple GI surgeries and has lost weight since then. Based on chart review and pt account she was about 145 lbs before initial hernia repair and 136 pounds after subsequent admission and hernia surgery.   Glucose / Insulin: no hx DM, A1c 5.5 in 2018, CBG <180, 1 unit SSI used in 24H Electrolytes: All WNL (K, Phos low end of normal) Renal: Scr <1, BUN WNL Hepatic: AST/ALT/Tbili/ TG WNL, albumin 2.2 Intake / Output; MIVF: UOP not documented, 90 mL drain, NS with KCl 20 mEq/L @ 40 mL/hr GI Imaging: none since start of TPN GI Surgeries / Procedures: none since start of TPN  Central access: PICC placed 02/13/23 TPN  start date: 02/13/23  Nutritional Goals: RD Estimated Needs Total Energy Estimated Needs: 1700-1900 Total Protein Estimated Needs: 85-100g Total Fluid Estimated Needs: >/=1.7L  Current Nutrition:  TPN  Plan:  Increase TPN to goal rate of 77mL/hr at 1800, providing 91g AA, 244g CHO, 52g ILE and 1711 kCal, meeting 100% of needs Electrolytes in TPN: Na 29mEq/L, K 28mEq/L, Ca 71mEq/L, Mg 18mEq/L, Phos 5mmol/L. Cl:Ac 1:1 Add standard MVI and trace elements to TPN Continue sensitive SSI Q6H Change MIVF to NS and reduce to 10 mL/hr at 1800 KPhos 31mmol IV x 1 and KCL x 2 runs in addition to TPN Monitor TPN labs on Mon/Thurs - labs in AM  Timothy Niblock 02/14/2023,8:20 AM  Leldon Steege D. Mina Marble, PharmD, BCPS, [place] 02/14/2023, 10:58 AM

## 2023-02-15 ENCOUNTER — Inpatient Hospital Stay (HOSPITAL_COMMUNITY): Payer: Medicare Other

## 2023-02-15 DIAGNOSIS — E440 Moderate protein-calorie malnutrition: Secondary | ICD-10-CM | POA: Insufficient documentation

## 2023-02-15 HISTORY — PX: IR GASTROSTOMY TUBE REMOVAL: IMG5492

## 2023-02-15 LAB — PHOSPHORUS: Phosphorus: 3.9 mg/dL (ref 2.5–4.6)

## 2023-02-15 LAB — BASIC METABOLIC PANEL
Anion gap: 6 (ref 5–15)
BUN: 12 mg/dL (ref 8–23)
CO2: 24 mmol/L (ref 22–32)
Calcium: 8.1 mg/dL — ABNORMAL LOW (ref 8.9–10.3)
Chloride: 107 mmol/L (ref 98–111)
Creatinine, Ser: 0.52 mg/dL (ref 0.44–1.00)
GFR, Estimated: >60 mL/min (ref >60)
Glucose, Bld: 153 mg/dL — ABNORMAL HIGH (ref 70–99)
Potassium: 4.1 mmol/L (ref 3.5–5.1)
Sodium: 137 mmol/L (ref 135–145)

## 2023-02-15 LAB — GLUCOSE, CAPILLARY: Glucose-Capillary: 138 mg/dL — ABNORMAL HIGH (ref 70–99)

## 2023-02-15 LAB — MAGNESIUM: Magnesium: 2 mg/dL (ref 1.7–2.4)

## 2023-02-15 MED ORDER — FENTANYL CITRATE (PF) 100 MCG/2ML IJ SOLN
INTRAMUSCULAR | Status: AC
  Filled 2023-02-15: qty 2

## 2023-02-15 MED ORDER — TRAVASOL 10 % IV SOLN
INTRAVENOUS | Status: AC
  Filled 2023-02-15: qty 907.2

## 2023-02-15 MED ORDER — ENOXAPARIN SODIUM 40 MG/0.4ML IJ SOSY
40.0000 mg | PREFILLED_SYRINGE | INTRAMUSCULAR | Status: DC
  Administered 2023-02-16 – 2023-02-19 (×4): 40 mg via SUBCUTANEOUS
  Filled 2023-02-15 (×4): qty 0.4

## 2023-02-15 MED ORDER — HYDROMORPHONE HCL 1 MG/ML IJ SOLN
0.5000 mg | Freq: Once | INTRAMUSCULAR | Status: AC
  Administered 2023-02-15: 0.5 mg via INTRAVENOUS

## 2023-02-15 MED ORDER — IOHEXOL 350 MG/ML SOLN
75.0000 mL | Freq: Once | INTRAVENOUS | Status: AC | PRN
  Administered 2023-02-15: 75 mL via INTRAVENOUS

## 2023-02-15 MED ORDER — MIDAZOLAM HCL 2 MG/2ML IJ SOLN
INTRAMUSCULAR | Status: AC
  Filled 2023-02-15: qty 2

## 2023-02-15 MED ORDER — LIDOCAINE VISCOUS HCL 2 % MT SOLN
OROMUCOSAL | Status: AC
  Filled 2023-02-15: qty 15

## 2023-02-15 MED ORDER — LIDOCAINE HCL 1 % IJ SOLN
INTRAMUSCULAR | Status: AC
  Filled 2023-02-15: qty 20

## 2023-02-15 MED ORDER — MIDAZOLAM HCL 2 MG/2ML IJ SOLN
INTRAMUSCULAR | Status: AC | PRN
  Administered 2023-02-15: 1 mg via INTRAVENOUS

## 2023-02-15 MED ORDER — FENTANYL CITRATE (PF) 100 MCG/2ML IJ SOLN
INTRAMUSCULAR | Status: AC | PRN
  Administered 2023-02-15: 50 ug via INTRAVENOUS

## 2023-02-15 MED ORDER — IOHEXOL 300 MG/ML  SOLN
50.0000 mL | Freq: Once | INTRAMUSCULAR | Status: AC | PRN
  Administered 2023-02-15: 30 mL

## 2023-02-15 NOTE — Progress Notes (Signed)
Patient ID: Monica White, female   DOB: [birth year], 82 y.o.   MRN: [id number] 6 Days Post-Op    Subjective: Patient up in chair. Pain overall controlled. Reports a moderate to large, solid stool this am. Denies nausea or vomiting. Tolerating sips of liquid from the floor.   Objective: Vital signs in last 24 hours: Temp:  [97.5 F (36.4 C)-98 F (36.7 C)] 97.5 F (36.4 C) (03/22 0744) Pulse Rate:  [81-84] 84 (03/22 0744) Resp:  [16-20] 20 (03/22 0744) BP: (131-137)/(84-97) 131/97 (03/22 0744) SpO2:  [94 %-96 %] 96 % (03/22 0744) Weight:  [57 kg] 57 kg (03/21 1049) Last BM Date : 02/15/23  Intake/Output from previous day: 03/21 0701 - 03/22 0700 In: 120 [P.O.:120] Out: 155 [Drains:155] Intake/Output this shift: No intake/output data recorded.  General appearance: alert and cooperative Resp: normal work of breathing on room air. GI: no distention, G tube clamped. There is about 1 cm of space between the abdominal wall and G tube bumper. The bumper was cinched to the skin and the number 4 cm is just outside of the bumper.  Lab Results: CBC  Recent Labs    02/13/23 0231 02/14/23 0401  WBC 7.4 7.3  HGB 10.2* 10.6*  HCT 31.7* 33.3*  PLT 424* 457*   BMET Recent Labs    02/14/23 0401 02/15/23 0305  NA 137 137  K 3.6 4.1  CL 106 107  CO2 25 24  GLUCOSE 150* 153*  BUN 6* 12  CREATININE 0.57 0.52  CALCIUM 7.8* 8.1*   PT/INR No results for input(s): "LABPROT", "INR" in the last 72 hours. ABG No results for input(s): "PHART", "HCO3" in the last 72 hours.  Invalid input(s): "PCO2", "PO2"  Studies/Results:   Anti-infectives: Anti-infectives (From admission, onward)    Start     Dose/Rate Route Frequency Ordered Stop   02/09/23 0719  metroNIDAZOLE (FLAGYL) 500 MG/100ML IVPB       Note to Pharmacy: Rejeana Brock L: cabinet override      02/09/23 0719 02/09/23 0758   02/09/23 0530  ceFEPIme (MAXIPIME) 2 g in sodium chloride 0.9 % 100 mL IVPB        2 g 200 mL/hr  over 30 Minutes Intravenous  Once 02/09/23 0520 02/09/23 0820   02/09/23 0530  metroNIDAZOLE (FLAGYL) IVPB 500 mg        500 mg 100 mL/hr over 60 Minutes Intravenous  Once 02/09/23 0520 02/09/23 0736       Assessment/Plan: Recurrent hiatal hernia POD5 S/P upper endo, laparoscopic reduction and G tube placement by Dr. Thermon Leyland 3/16  - AFVSS, WBC normalized -  had some ischemic gastric mucosa, JPs SS. Continue g tube to gravity for now - having flatus and had first BM today - UGI yesterday with no gross leak into abdominal cavity but raised concern for part of gastrostomy tube to be anterior to the gastric wall. I cinched the tube down as above today. Get CT chest/abd/pelvis to fully assess the hiatial hernia and the placement of the gastrostomy balloon within the stomach. I discussed this plan with CT tech at [facility] cone. - TCTS consulted - appreciate their evaluation - PICC/TPN started 3/20 - Encouraged ambulation and IS use  FEN - NPO, G tube clamped. Continue limited sips until CT results VTE - lovenox ID: cefepime/flagyl x 1 dose 3/16 Dispo - med surg  Hypothyroidism - home levothyroxine held while NPO Hypercholesteremia GERD Arthritis   LOS: 6 days    Jill Alexanders, PA-C  [facility] 02/15/2023, 10:15 AM Please see Amion for pager number during day hours 7:00am-4:30pm   02/15/2023

## 2023-02-15 NOTE — Consult Note (Signed)
   [facility] CM Inpatient Consult   02/15/2023  Jazz Gagnon 1941/07/10 [id number]  Orientation with Natividad Brood, [facility] Liaison for review.  Location: [facility] Liaison met with pt via bedside [facility]).   [facility]) [place] Patient: Insurance ([place])    Primary Care Provider:  Crist Infante, MD with [facility]   Patient screened for less readmission hospitalization with noted medium risk score for unplanned readmission risk with a 7 day LOS and 2 IP within 6 months. Liaison assessed for potential [place] [facility]) Care Management service needs for post hospital transition for care coordination. No needs anticipated at this time related to ongoing surgery interventions. Pt was given an appointment reminder card and 24 hours Nurse Advise Line magnet.      Plan:  HIPAA verified and [facility] hospital liaison.     [place] does not replace or interfere with any arrangements made by the Inpatient Transition of Care team.   For questions contact:     Raina Mina, RN, [place] Hours M-F 8:00 am to 5 pm [phone] mobile [phone] [Office toll free line][facility] Office Hours are M-F 8:30 - 5 pm 24 hour nurse advise line [phone] Conceirge  [email or web address]

## 2023-02-15 NOTE — Progress Notes (Signed)
Arrived to hang new bag of TPN but pt is at IR at this time.

## 2023-02-15 NOTE — Progress Notes (Signed)
Returned from CT via W/C. States has " terrible abdominal pain". Assisted back to bed, will give ordered Dilaudid IV for pain control

## 2023-02-15 NOTE — TOC Initial Note (Addendum)
Transition of Care [facility]) - Initial/Assessment Note    Patient Details  Name: Monica White MRN: [id number] Date of Birth: July 10, 1941  Transition of Care [facility]) CM/SW Contact:    Curlene Labrum, RN Phone Number: 02/15/2023, 10:48 AM  Clinical Narrative:                 CM met with the patient at the bedside to discuss TOC needs for home.  The patient states that she lives alone but likely plans to stay with her daughter in [place] for care post discharge from the hospital.  The patient has right arm double lumen PICC line that has IVF and TPN infusing at this time.  Patient has no DME at the home.  Patient also has G-tube clamped and 2 bulb JP drains in LLQ of abdomen.  Bedside nursing states that she has taught the patient's daughter wound care and care of the JP drains for home at this time.  I sent a message to [place], [place] to follow up regarding any home needs in case patient is discharged over the weekend - will wait to hear back from surgery PA. - Spoke with [place], [place] and she states that patient would likely not need PICC nor TPN for home.  Patient transferred to Xray at this time.  Expected Discharge Plan: [place] Barriers to Discharge: Continued Medical Work up   Patient Goals and CMS Choice Patient states their goals for this hospitalization and ongoing recovery are:: To get better and return home CMS Medicare.gov Compare Post Acute Care list provided to:: Patient Choice offered to / list presented to : Patient [place] ownership interest in [facility].provided to:: Patient    Expected Discharge Plan and Services   Discharge Planning Services: CM Consult Post Acute Care Choice: [place] arrangements for the past 2 months: [place]: RN [place] Agency: [place] Date [place]: 02/15/23 Time [place]: [id number] Representative spoke with at  [place]: Will contact Tommi Rumps, [place] with Alvis Lemmings to follow for possible HH for RN  Prior Living Arrangements/Services Living arrangements for the past 2 months: [place] with:: Self Patient language and need for interpreter reviewed:: Yes Do you feel safe going back to the place where you live?: Yes      Need for Family Participation in Patient Care: Yes (Comment) Care giver support system in place?: Yes (comment)   Criminal Activity/Legal Involvement Pertinent to Current Situation/Hospitalization: No - Comment as needed  Activities of Daily Living      Permission Sought/Granted Permission sought to share information with : Case Manager Permission granted to share information with : Yes, Verbal Permission Granted              Emotional Assessment Appearance:: Appears stated age Attitude/Demeanor/Rapport: Gracious Affect (typically observed): Accepting Orientation: : Oriented to Self, Oriented to Place, Oriented to  Time, Oriented to Situation Alcohol / Substance Use: Not Applicable Psych Involvement: No (comment)  Admission diagnosis:  Gastric volvulus [K31.89] Hiatal hernia [K44.9] Pleural effusion [J90] Nausea and vomiting, unspecified vomiting type [R11.2] Patient Active Problem List   Diagnosis Date Noted   Gastric volvulus 02/09/2023   Diaphragmatic hernia 11/01/2022   Hiatal hernia 03/12/2022   Shortness of breath 10/18/2021   Central retinal  artery occlusion 09/19/2017   PCP:  Crist Infante, MD Pharmacy:   [facility] [place], [place] - [address] AT [place] [address] [place] [address] Phone: [phone] Fax: [phone]  [facility] [place], [place] [address] Phone: [phone] Fax: [phone]     Social Determinants of Health (SDOH) Social History: [place]: No Food Insecurity (11/05/2022)  Housing: Low Risk  (11/05/2022)  Transportation Needs: No Transportation Needs (11/05/2022)  Utilities: Not At Risk (11/05/2022)  Tobacco Use: Low Risk  (02/10/2023)   SDOH Interventions:     Readmission Risk Interventions    02/15/2023   10:48 AM  Readmission Risk Prevention Plan  Transportation Screening Complete  PCP or Specialist Appt within 5-7 Days Complete  Home Care Screening Complete  Medication Review (RN CM) Complete

## 2023-02-15 NOTE — Progress Notes (Signed)
Mobility Specialist - Progress Note   02/15/23 1519  Mobility  Activity Ambulated with assistance to bathroom  Level of Assistance Standby assist, set-up cues, supervision of patient - no hands on  Assistive Device None  Distance Ambulated (ft) 20 ft  Activity Response Tolerated well  Mobility Referral Yes  $Mobility charge 1 Mobility   Pt was received in bed. Pt refused hallway ambulation at this date d/t abdominal pain but did request assistance to BR. No fault throughout ambulation. Pt with successful void. Pt was returned to bed with all needs met.   Franki Monte  Mobility Specialist Please contact via Solicitor or Rehab office at [phone]

## 2023-02-15 NOTE — Progress Notes (Signed)
To IR via bed in stable condition.

## 2023-02-15 NOTE — Procedures (Signed)
Pre procedure Dx: Dysphagia; Malpositioned G-tube. Post Procedure Dx: Same  Successful fluoroscopic replacement and repositioning of balloon retention gastrostomy tube.   The gastrostomy tube is ready for immediate usage.  EBL: Minimal Complications: None immediate  Ronny Bacon, MD Pager #: [phone]

## 2023-02-15 NOTE — Progress Notes (Addendum)
PHARMACY - TOTAL PARENTERAL NUTRITION CONSULT NOTE  Indication:  Prolonged NPO status  Patient Measurements: Height: 5\' 7"  (170.2 cm) Weight: 57 kg (125 lb 11.2 oz) IBW/kg (Calculated) : 61.6 TPN AdjBW (KG): 59 Body mass index is 19.69 kg/m. Usual Weight: 62 kg  Assessment:  Pt presenting to [facility] on 3/16 for chest pain, vomiting and hernia symptoms. Pt underwent laparoscopic paraesophageal hernia repair with Toupet fundoplication on 0000000. Presented to ED on 11/02/22 with recurrent large hiatal hernia and underwent robotic reduction of hernia with G tube insertion. It was unable to be fully repaired due to scar tissue from previous surgery. G tube was dislodged on 11/12/22. Pt failed multiple attempts to decompress stomach with NG tube and underwent upper endoscopy with decompression of gastric volvulus and g tube placement. She has had minimal flatus passage and has been without nutrition for about 4 days and expected prolonged NPO status.  Pharmacy consulted to dose TPN.  Pt was eating a full regular diet PTA on 3/16 and her last full meal was the night of the 15th.  She typically eats 3 meals per day with a light snack. She drinks lifelyte nutrition supplements at home and typically drinks 1 a day. She does not get a good mix of veggies at home but does eat a lot of yogurt. She has had multiple GI surgeries and has lost weight since then. Based on chart review and pt account she was about 145 lbs before initial hernia repair and 136 pounds after subsequent admission and hernia surgery.   Glucose / Insulin: no hx DM, A1c 5.5 in 2018, CBG 130-150s, 3 unit SSI used in 24H Electrolytes: All WNL  Renal: Scr <1, BUN WNL Hepatic: 3/21: AST/ALT/Tbili/ TG WNL, albumin 2.2 Intake / Output; MIVF: UOP not documented, 155 mL drain, NS @ KVO, bowel movement this morning  GI Imaging: UGI 3/21: substantial hiatal hernia, esophageal dysmotility disorder, no obstruction GI Surgeries / Procedures: none since  start of TPN  Central access: PICC placed 02/13/23 TPN start date: 02/13/23  Nutritional Goals: RD Estimated Needs Total Energy Estimated Needs: 1700-1900 Total Protein Estimated Needs: 85-100g Total Fluid Estimated Needs: >/=1.7L  Current Nutrition:  TPN NPO except for ice chips  Plan:  Continue TPN at goal rate of 50mL/hr at 1800, providing 91g AA, 244g CHO, 52g ILE and 1711 kCal, meeting 100% of needs Electrolytes in TPN: Na 54mEq/L, K 62mEq/L, Ca 44mEq/L, Mg 25mEq/L, Phos 59mmol/L. Cl:Ac 1:1 Add standard MVI and trace elements to TPN Discontinue CBG/SSI checks Continue NS @ 10 mL/hr  Monitor TPN labs on Mon/Thurs - labs in AM  Rodolph Bong, PharmD Candidate 02/15/2023 8:10 AM

## 2023-02-15 NOTE — Progress Notes (Signed)
     [address]       [place] [address]             [phone]       Pt currently in IR  I am not available next week.  If replacement of G tube is unsuccessful, I would strongly recommend transfer to a [facility] hospital for definitive repair.  She likely will require a combined approach of thoracic mobilization and repair of the hiatus from the abdomen.     My concerns include the following: - the degree of ischemia/viability of her stomach in the area of the gastrostomy tube.  Would consider repeat EGD for further assessment.  - If enteral access cannot be re-established, at her age, she likely would not tolerate prolonged TPN.  Would consider placement of a J-tube if transfer cannot be expedited.

## 2023-02-15 NOTE — Plan of Care (Signed)

## 2023-02-15 NOTE — Progress Notes (Signed)
To CT via W/C in stable condition

## 2023-02-15 NOTE — Consult Note (Signed)
Chief Complaint: Malposition G tube. Request is for G tube replacement and reposition  Referring Physician(s): L Sima an PA  Supervising Physician: Sandi Mariscal  Patient Status: [facility] - In-pt  History of Present Illness: Monica White is a 82 y.o. female inpatient. Complex abdominal history. History of  recurrent hiatal hernia  s/p upper endo, lap reduction and G tube placement by Dr. Thermon Leyland on 3.16.24.  Found to have the G tube malpositioned. CT CAP from 3.22.24 reads The gastrostomy tube tip is anterior to the stomach and external to the gastric lumen. The balloon of the distal catheter is inflated within the anterior abdominal wall musculature. Patient had a prior reduction of the incarcerated hiatal hernia and lap G tube insertion on 12.12.23 and the G tube failed at that time. Patient had a prior reduction on  Team is requesting a g tube replacement/repositioned.   Patient alert and laying in bed,calm. Endorses abdominal pain that she states is better with pain medication. Denies any fevers, headache, chest pain, SOB, cough, abdominal pain, nausea, vomiting or bleeding. Return precautions and treatment recommendations and follow-up discussed with the patient and her son Monica White ( via the telephone) who are both agreeable with the plan.    Past Medical History:  Diagnosis Date   Anemia    Arthritis    Dyspnea    GERD (gastroesophageal reflux disease)    History of hiatal hernia    Hypercholesteremia    Hypothyroid 2015   PONV (postoperative nausea and vomiting)     Past Surgical History:  Procedure Laterality Date   COLONOSCOPY     HIATAL HERNIA REPAIR N/A 03/12/2022   Procedure: LAPAROSCOPIC REPAIR OF HIATAL HERNIA;  Surgeon: Kieth Brightly Arta Bruce, MD;  Location: [facility];  Service: General;  Laterality: N/A;   RIGHT HEART CATH AND CORONARY ANGIOGRAPHY N/A 12/01/2021   Procedure: RIGHT HEART CATH AND CORONARY ANGIOGRAPHY;  Surgeon: Sherren Mocha, MD;  Location: [facility];  Service: Cardiovascular;  Laterality: N/A;   TUBAL LIGATION  11/27/1983   UPPER GASTROINTESTINAL ENDOSCOPY     UPPER GI ENDOSCOPY N/A 02/09/2023   Procedure: UPPER GI ENDOSCOPY; DECOMPRESSION OF GASTRIC VOLVULUS; LAPAROSCOPIC GASTROSTOMY TUBE PLACEMENT;  Surgeon: Felicie Morn, MD;  Location: [facility];  Service: General;  Laterality: N/A;   XI ROBOTIC ASSISTED HIATAL HERNIA REPAIR N/A 11/06/2022   Procedure: XI ROBOTIC REDUCTIONOF INCARCERATED HIATAL HERNIA REPAIR, LAPAROSCOPIC G-TUBE INSERTION, EGD;  Surgeon: Kinsinger, Arta Bruce, MD;  Location: [facility];  Service: General;  Laterality: N/A;    Allergies: Atorvastatin, Escitalopram, Pitavastatin, and Tramadol  Medications: Prior to Admission medications   Medication Sig Start Date End Date Taking? Authorizing Provider  Ascorbic Acid (VITAMIN C PO) Take 1 tablet by mouth daily.   Yes [provider]  cholecalciferol (VITAMIN D3) 25 MCG (1000 UNIT) tablet Take 1,000 Units by mouth daily.   Yes [provider]  Cyanocobalamin (B-12) 1000 MCG CAPS Take 1,000 mcg by mouth daily.   Yes [provider]  esomeprazole (NEXIUM) 20 MG packet Take 20 mg by mouth daily before breakfast. 11/02/22  Yes Kinsinger, Arta Bruce, MD  estradiol (ESTRACE) 0.5 MG tablet Take 0.5 mg by mouth daily.   Yes [provider]  levothyroxine (SYNTHROID, LEVOTHROID) 50 MCG tablet Take 50 mcg by mouth daily. 02/27/15  Yes [provider]  medroxyPROGESTERone (PROVERA) 2.5 MG tablet Take 1 tablet (2.5 mg total) by mouth daily. 03/04/15  Yes Nunzio Cobbs, MD  Multiple  Vitamins-Minerals (MULTIVITAMIN PO) Take 1 tablet by mouth daily.   Yes [provider]  ondansetron (ZOFRAN-ODT) 4 MG disintegrating tablet Take 1 tablet (4 mg total) by mouth every 6 (six) hours as needed for nausea. 11/09/22  Yes Kinsinger, Arta Bruce, MD  Probiotic Product (PROBIOTIC PO) Take 1 capsule by mouth daily. Align    Yes [provider]     Family History  Problem Relation Age of Onset   Heart disease Mother     Social History   Socioeconomic History   Marital status: Widowed    Spouse name: Not on file   Number of children: Not on file   Years of education: Not on file   Highest education level: Not on file  Occupational History   Not on file  Tobacco Use   Smoking status: Never   Smokeless tobacco: Never  Vaping Use   Vaping Use: Never used  Substance and Sexual Activity   Alcohol use: Not Currently    Comment: rare   Drug use: No   Sexual activity: Yes    Partners: Male    Birth control/protection: Post-menopausal, Surgical    Comment: Tubal  Other Topics Concern   Not on file  Social History Narrative   Not on file   Social Determinants of Health   Financial Resource Strain: Not on file  Food Insecurity: No Food Insecurity (11/05/2022)   Hunger Vital Sign    Worried About Running Out of Food in the Last Year: Never true    Ran Out of Food in the Last Year: Never true  Transportation Needs: No Transportation Needs (11/05/2022)   PRAPARE - Hydrologist (Medical): No    Lack of Transportation (Non-Medical): No  Physical Activity: Not on file  Stress: Not on file  Social Connections: Not on file    Review of Systems: A 12 point ROS discussed and pertinent positives are indicated in the HPI above.  All other systems are negative.  Review of Systems  Constitutional:  Negative for fatigue and fever.  HENT:  Negative for congestion.   Respiratory:  Negative for cough and shortness of breath.   Gastrointestinal:  Positive for abdominal pain (around the G tube exit site. beter with pain medication). Negative for diarrhea, nausea and vomiting.    Vital Signs: BP (!) 131/97 (BP Location: Left Arm)   Pulse 84   Temp (!) 97.5 F (36.4 C) (Oral)   Resp 20   Ht 5\' 7"  (1.702 m)   Wt 125 lb 11.2 oz (57 kg)   LMP 01/13/1994   SpO2 96%    BMI 19.69 kg/m     Physical Exam Vitals and nursing note reviewed.  Constitutional:      Appearance: She is well-developed.  HENT:     Head: Normocephalic and atraumatic.  Eyes:     Conjunctiva/sclera: Conjunctivae normal.  Cardiovascular:     Rate and Rhythm: Normal rate and regular rhythm.  Pulmonary:     Effort: Pulmonary effort is normal.  Musculoskeletal:        General: Normal range of motion.     Cervical back: Normal range of motion.  Skin:    General: Skin is warm.  Neurological:     General: No focal deficit present.     Mental Status: She is alert and oriented to person, place, and time.  Psychiatric:        Mood and Affect: Mood normal.  Behavior: Behavior normal.     Imaging: CT CHEST ABDOMEN PELVIS W CONTRAST  Result Date: 02/15/2023 CLINICAL DATA:  Status post gastrostomy tube placement. EXAM: CT CHEST, ABDOMEN, AND PELVIS WITH CONTRAST TECHNIQUE: Multidetector CT imaging of the chest, abdomen and pelvis was performed following the standard protocol during bolus administration of intravenous contrast. RADIATION DOSE REDUCTION: This exam was performed according to the departmental dose-optimization program which includes automated exposure control, adjustment of the mA and/or kV according to patient size and/or use of iterative reconstruction technique. CONTRAST:  81mL OMNIPAQUE IOHEXOL 350 MG/ML SOLN COMPARISON:  February 09, 2023. FINDINGS: CT CHEST FINDINGS Cardiovascular: No significant vascular findings. Normal heart size. No pericardial effusion. Mediastinum/Nodes: Large sliding-type hiatal hernia is noted with most of the stomach in the thoracic space. Thyroid gland is unremarkable. No adenopathy is noted. Lungs/Pleura: No pneumothorax is noted. Right lung is clear. Large left sided hiatal hernia is again noted with most of the stomach noted in the thoracic space. Small left pleural effusion is noted with probable associated atelectasis of the left upper and  lower lobes. There appears to be surgical drain entering the left lower quadrant of the abdomen which extends anteriorly through the abdominal space and appears to enter the thoracic space with distal tip seen anteriorly in the left lung apex. Musculoskeletal: No chest wall mass or suspicious bone lesions identified. CT ABDOMEN PELVIS FINDINGS Hepatobiliary: No cholelithiasis or biliary dilatation is noted. Stable hepatic cysts. Pancreas: Unremarkable. No pancreatic ductal dilatation or surrounding inflammatory changes. Spleen: Normal in size without focal abnormality. Adrenals/Urinary Tract: Adrenal glands are unremarkable. Kidneys are normal, without renal calculi, focal lesion, or hydronephrosis. Bladder is unremarkable. Stomach/Bowel: Gastrostomy tube is noted in left upper quadrant, but its tip is not within the gastric lumen. The balloon appears to be inflated within the anterior abdominal wall. Sigmoid diverticulosis is noted without inflammation. There is no evidence of bowel obstruction. The appendix is unremarkable. Vascular/Lymphatic: Aortic atherosclerosis. No enlarged abdominal or pelvic lymph nodes. Reproductive: Uterus is unremarkable. 3.8 cm left adnexal cyst is noted. Other: In addition to the surgical drain described in chest section, there is a nother drain entering the left lower quadrant with distal tip seen anteriorly in the left upper quadrant. No hernia or significant ascites is noted. Musculoskeletal: No acute or significant osseous findings. IMPRESSION: The gastrostomy tube tip is anterior to the stomach and external to the gastric lumen. The balloon of the distal catheter is inflated within the anterior abdominal wall musculature. Repositioning is recommended. There also has been interval placement of surgical drain entering left lower quadrant of abdomen which passes through the abdomen and into the left thoracic space, with distal tip noted anteriorly in the left lung apex. No definite  pneumothorax is noted. These results will be called to the ordering clinician or representative by the Radiologist Assistant, and communication documented in the PACS or zVision Dashboard. Large sliding-type hiatal hernia is noted with most of the stomach noted within the left hemithorax. This results in subsegmental atelectasis of the left upper and lower lobes, with small left pleural effusion present as well. Sigmoid diverticulosis without inflammation. 3.8 cm left adnexal cyst. Recommend follow-up US in 6-12 months. Note: This recommendation does not apply to premenarchal patients and to those with increased risk (genetic, family history, elevated tumor markers or other high-risk factors) of ovarian cancer. Reference: JACR 2020 Feb; 17(2):248-254. Aortic Atherosclerosis (ICD10-I70.0). Electronically Signed   By: Marijo Conception M.D.   On: 02/15/2023 11:53  DG UGI W SINGLE CM (SOL OR THIN BA)  Result Date: 02/14/2023 CLINICAL DATA:  Patient with history of massive hiatal hernia and gastric volvulus. Patient underwent decompression of gastric volvulus on 02/09/2023, with reduction of part of the stomach back into the abdomen. Small anterior gastric perforation utilized intraoperatively for placement of a surgical gastrostomy tube. Current postoperative day 5, request to evaluate for leak assessment. EXAM: DG UGI W SINGLE CM TECHNIQUE: Scout radiograph was obtained. Single contrast examination was performed using water-soluble contrast medium. This exam was performed by Reatha Armour, PA-C , and was supervised and interpreted by Dr Sherryl Barters. FLUOROSCOPY: Radiation Exposure Index (as provided by the fluoroscopic device): 15.5 mGy Kerma COMPARISON:  RF DG UGI W SINGLE CM 11/02/2022 FINDINGS: Scout Radiograph: Gastrostomy tube in the left upper quadrant. Drains project over the left upper quadrant including 1 that is noted to extend up towards the left lung apex. Levoconvex lumbar scoliosis with rotary  component and substantial spondylosis and degenerative disc disease. No current dilated bowel. Esophagus: No definite anatomic abnormality although assessment is mildly limited due to secondary and tertiary contractions, and the focus of today's exam on the stomach. Esophageal motility: Nonspecific esophageal dysmotility disorder with extensive secondary and tertiary contractions and disruption of primary peristaltic waves in the upper thoracic esophagus. Gastroesophageal reflux:  None visualized. Ingested 77mm barium tablet:  Not given Stomach: Substantial remaining hiatal hernia containing a notable portion of the stomach in the left chest. Transit of contrast medium from the thoracic part of the stomach into the intraabdominal portion is sluggish although is shown on image 1 series 8. In order to achieve better filling of the stomach and also to assess the region of the gastrostomy tube, the gastrostomy tube was injected with water-soluble contrast medium. There is brisk flow of contrast back along the tract of the gastrostomy tube to the skin, with the majority of injected contrast entering the stomach but a substantial minority back tracking to the skin surface, as shown on cine images from frontal and lateral projections on series 12 and series 14. On lateral series 14, I am uncertain of the exact position of the gastrostomy tube components with respect to the stomach; the tip of the tube is believed to be in the stomach lumen given the periphery in show filling of the stomach, but may barely be into the stomach for example on image 70 of series 14. Contrast tracks around a small filling defect around the tube which may represent collapsed balloon outside the gastric lumen likewise on image 70 of series 14. The contrast tracks around the tube back to the skin surface. My sense related to images 1 through 20 of series 14 as at the contrast is tracking around the distal margin of the gastrostomy tube lumen back  to the cutaneous surface, rather than through a crack or defect in the wall of the tube. On image 1 of series 15 we demonstrate filling of the distal stomach and proximal duodenum indicating no high-grade outlet obstruction. Gastric emptying: On series 15, contrast is visualized extending into the proximal duodenum. Duodenum: Proximal portion unremarkable, transverse and distal duodenum not assessed Other: On lateral projections, gas density volar to the lower stomach is probably an bowel. IMPRESSION: 1. Today's exam was performed using water-soluble contrast medium, first by having the patient take multiple swallows of contrast medium, and subsequently by direct injection of the gastrostomy tube. 2. The tip of the gastrostomy tube is believed to be just into the  stomach lumen. When the gastrostomy tube is injected, there is flow of a substantial minority of the injected contrast back around the tube along the tract to the skin surface, and my suspicion is that this is leaking around the tube margin at the gastrostomy site. However, we did not demonstrate any leakage of contrast out into the abdominal cavity. Cannot exclude collapsed balloon portion of the gastrostomy tube anterior to the gastric wall (this could be better assessed by abdomen CT). 3. Substantial remaining hiatal hernia in the left chest, although improved compared to CT chest of 02/09/2023. Fairly sluggish flow of contrast from this capacious intrathoracic portion of the stomach into the intraabdominal portion of the stomach although occasional transit was shown, for example on series 8. 4. Nonspecific esophageal dysmotility disorder. 5. Gas density volar to the lower stomach on lateral projections is probably an bowel loop rather than an extraluminal gas collection. 6. No high-grade outlet obstruction; contrast flow into the proximal duodenum is shown on series 15. Electronically Signed   By: Van Clines M.D.   On: 02/14/2023 15:59   [place]  EKG SITE RITE  Result Date: 02/13/2023 If Site Rite image not attached, placement could not be confirmed due to current cardiac rhythm.  DG CHEST PORT 1 VIEW  Result Date: 02/13/2023 CLINICAL DATA:  Hiatal hernia.  Post gastrointestinal surgery. EXAM: PORTABLE CHEST 1 VIEW COMPARISON:  02/09/2023; 10/31/2022; chest CT-02/09/2023 FINDINGS: Grossly unchanged enlarged cardiac silhouette and mediastinal contours. Interval placement of left-sided chest tube.  No pneumothorax. Suspected repair of previously noted large hiatal hernia with improved aeration of the left lung base with persistent left mid and lower lung heterogeneous opacities. No new focal airspace opacities. No pleural effusion. No evidence of edema. No acute osseous abnormalities. Scoliotic curvature of the thoracolumbar spine with associated multilevel DDD, incompletely evaluated. IMPRESSION: 1. Suspected repair of previously noted large hiatal hernia with improved aeration of the left lung base. 2. Interval placement of left-sided chest tube without evidence of pneumothorax. 3. Improved aeration of the left lung with persistent left mid and lower lung heterogeneous opacities, likely atelectasis. Electronically Signed   By: Sandi Mariscal M.D.   On: 02/13/2023 08:21   CT Angio Chest/Abd/Pel for Dissection W and/or Wo Contrast  Result Date: 02/09/2023 CLINICAL DATA:  Acute aortic syndrome suspected. Chest pain and emesis. EXAM: CT ANGIOGRAPHY CHEST, ABDOMEN AND PELVIS TECHNIQUE: Non-contrast CT of the chest was initially obtained. Multidetector CT imaging through the chest, abdomen and pelvis was performed using the standard protocol during bolus administration of intravenous contrast. Multiplanar reconstructed images and MIPs were obtained and reviewed to evaluate the vascular anatomy. RADIATION DOSE REDUCTION: This exam was performed according to the departmental dose-optimization program which includes automated exposure control, adjustment of  the mA and/or kV according to patient size and/or use of iterative reconstruction technique. CONTRAST:  139mL OMNIPAQUE IOHEXOL 350 MG/ML SOLN COMPARISON:  12/28/2022. FINDINGS: CTA CHEST FINDINGS Cardiovascular: The heart is normal in size and there is a trace pericardial effusion. A few scattered coronary artery calcifications are noted. There is atherosclerotic calcification of the aorta without evidence of aneurysm or dissection. Pulmonary trunk is normal in caliber. Mediastinum/Nodes: No mediastinal, hilar, or axillary lymphadenopathy. The thyroid gland and trachea are within normal limits. The proximal esophagus is distended with debris. There is elevation of the left diaphragm with a large hiatal hernia containing the stomach distended with debris. Lungs/Pleura: Atelectasis is present bilaterally. There is atelectasis or infiltrate at the left lung base. A  small left pleural effusion is noted. Musculoskeletal: Degenerative changes are present in the thoracic spine. No acute osseous abnormality. Review of the MIP images confirms the above findings. CTA ABDOMEN AND PELVIS FINDINGS VASCULAR Aorta: Normal caliber aorta without aneurysm, dissection, vasculitis or significant stenosis. Aortic atherosclerosis. Celiac: There is a small dissection flap in the proximal celiac artery. The hepatic artery and splenic arteries are patent. There is mild narrowing of the proximal celiac artery with poststenotic dilatation measuring 1.2 cm. SMA: A dissection flap is noted in the mid SMA. No stenosis or aneurysm. Renals: Both renal arteries are patent without evidence of aneurysm, dissection, vasculitis, fibromuscular dysplasia or significant stenosis. IMA: Patent without evidence of aneurysm, dissection, vasculitis or significant stenosis. Inflow: Patent without evidence of aneurysm, dissection, vasculitis or significant stenosis. Veins: No obvious venous abnormality within the limitations of this arterial phase study. Review  of the MIP images confirms the above findings. NON-VASCULAR Hepatobiliary: Subcentimeter hypodensities are present in the liver, statistically most likely representing cysts or hemangiomas. No biliary ductal dilatation. The gallbladder is without stones. Pancreas: Unremarkable. No pancreatic ductal dilatation or surrounding inflammatory changes. Spleen: Normal in size without focal abnormality. Adrenals/Urinary Tract: The adrenal glands are within normal limits. The kidneys enhance symmetrically. No renal calculus or hydronephrosis. Parapelvic cysts are noted on the left. The bladder is unremarkable. Stomach/Bowel: There is a large hiatal hernia in the stomach is completely intrathoracic in location and distended with debris. No free air or pneumatosis. Scattered diverticula are present along the colon without evidence of diverticulitis. A normal appendix is seen in the mid abdomen. Lymphatic: No abdominal or pelvic lymphadenopathy. Reproductive: A calcification is noted in the uterus, likely degenerating fibroid. There is a cystic structure in the left adnexa measuring 3.2 cm, slightly decreased from the prior exam. No adnexal mass on the right. Other: No abdominopelvic ascites.  No acute osseous abnormality. Musculoskeletal: No acute osseous abnormality. Review of the MIP images confirms the above findings. IMPRESSION: 1. Aortic atherosclerosis without evidence of aneurysm or dissection. 2. 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 3. Large hiatal hernia with intrathoracic stomach. The stomach is markedly distended with air and debris, volvulus can not be completely excluded. Surgical consultation is suggested. 4. Small left pleural effusion with atelectasis or infiltrate. 5. Remaining incidental findings as described above. Electronically Signed   By: Brett Fairy M.D.   On: 02/09/2023 05:00   DG Chest Portable 1 View  Result  Date: 02/09/2023 CLINICAL DATA:  Chest pain. EXAM: PORTABLE CHEST 1 VIEW COMPARISON:  October 31, 2022 FINDINGS: There is stable mild to moderate severity enlargement of the cardiac silhouette. Tortuosity of the ascending and descending thoracic aorta is seen. There is a large gastric hernia which extends into the left lung base. This is increased in size when compared to the prior study. Subsequent elevation of the left hemidiaphragm is seen. Mild atelectasis is noted within the left lung base. There is no evidence of a pleural effusion or pneumothorax. The visualized skeletal structures are unremarkable. IMPRESSION: 1. Large gastric hernia, as described above. 2. Mild left basilar atelectasis. Electronically Signed   By: Virgina Norfolk M.D.   On: 02/09/2023 01:21    Labs:  CBC: Recent Labs    02/11/23 0209 02/12/23 0241 02/13/23 0231 02/14/23 0401  WBC 14.3* 11.5* 7.4 7.3  HGB 10.3* 9.9* 10.2* 10.6*  HCT 32.0* 31.5* 31.7* 33.3*  PLT 390 388 424* 457*  COAGS: No results for input(s): "INR", "APTT" in the last 8760 hours.  BMP: Recent Labs    02/12/23 0241 02/13/23 0231 02/14/23 0401 02/15/23 0305  NA 137 135 137 137  K 3.9 3.7 3.6 4.1  CL 103 104 106 107  CO2 23 22 25 24   GLUCOSE 87 85 150* 153*  BUN 8 7* 6* 12  CALCIUM 7.7* 7.5* 7.8* 8.1*  CREATININE 0.71 0.61 0.57 0.52  GFRNONAA >60 >60 >60 >60    LIVER FUNCTION TESTS: Recent Labs    02/14/23 0401  BILITOT 0.6  AST 13*  ALT 14  ALKPHOS 67  PROT 5.0*  ALBUMIN 2.2*    Assessment and Plan:  82 y.o. female inpatient. History of  recurrent hiatal hernia  s/p upper endo, lap reduction and G tube placement by Dr. Thermon Leyland on 3.16.24.  Found to have the G tube malpositioned. CT CAP from 3.22.24 reads The gastrostomy tube tip is anterior to the stomach and external to the gastric lumen. The balloon of the distal catheter is inflated within the anterior abdominal wall musculature. Patient had a prior reduction  of the incarcerated hiatal hernia and lap G tube insertion on 12.12.23 and the G tube failed at that time. Patient had a prior reduction on  Team is requesting a g tube replacement/repositioned.   All labs are within acceptable parameters. Patient is on subcutaneous prophylactic dose of lovenox. Last dose given on 3.21.24 @ 18:09. Per RN patient has been NPO since midnight.   Risks and benefits image guided gastrostomy tube placement was discussed with the patient and her son Monica White (via the telehone)  including, but not limited to the need for a barium enema during the procedure, bleeding, infection, peritonitis and/or damage to adjacent structures.  All of the patient's and her son's questions were answered, patient is agreeable to proceed.  Consent signed and in chart.   Thank you for this interesting consult.  I greatly enjoyed meeting [place] and look forward to participating in their care.  A copy of this report was sent to the requesting provider on this date.  Electronically Signed: Jacqualine Mau, NP 02/15/2023, 12:45 PM   I spent a total of 40 Minutes    in face to face in clinical consultation, greater than 50% of which was counseling/coordinating care for G utbe replacement repostioned

## 2023-02-15 NOTE — Progress Notes (Signed)
Pt returned to room via bed in stable condition. Drsg to pegtube site C/D/I. Pt resting with eyes closed

## 2023-02-16 LAB — PHOSPHORUS: Phosphorus: 4.3 mg/dL (ref 2.5–4.6)

## 2023-02-16 LAB — GLUCOSE, CAPILLARY
Glucose-Capillary: 117 mg/dL — ABNORMAL HIGH (ref 70–99)
Glucose-Capillary: 123 mg/dL — ABNORMAL HIGH (ref 70–99)
Glucose-Capillary: 166 mg/dL — ABNORMAL HIGH (ref 70–99)

## 2023-02-16 LAB — BASIC METABOLIC PANEL
Anion gap: 6 (ref 5–15)
BUN: 14 mg/dL (ref 8–23)
CO2: 21 mmol/L — ABNORMAL LOW (ref 22–32)
Calcium: 8.1 mg/dL — ABNORMAL LOW (ref 8.9–10.3)
Chloride: 106 mmol/L (ref 98–111)
Creatinine, Ser: 0.52 mg/dL (ref 0.44–1.00)
GFR, Estimated: >60 mL/min (ref >60)
Glucose, Bld: 141 mg/dL — ABNORMAL HIGH (ref 70–99)
Potassium: 4.2 mmol/L (ref 3.5–5.1)
Sodium: 133 mmol/L — ABNORMAL LOW (ref 135–145)

## 2023-02-16 MED ORDER — TRAVASOL 10 % IV SOLN
INTRAVENOUS | Status: AC
  Filled 2023-02-16: qty 907.2

## 2023-02-16 MED ORDER — BOOST / RESOURCE BREEZE PO LIQD CUSTOM
1.0000 | Freq: Three times a day (TID) | ORAL | Status: DC
  Administered 2023-02-16 – 2023-02-19 (×9): 1 via ORAL

## 2023-02-16 NOTE — Progress Notes (Signed)
[facility] Progress Note  7 Days Post-Op  Subjective: CC-  IR able to successfully exchange G tube yesterday. Patient states that she is feeling well. She passed gas multiple times yesterday and had a good BM. Has not passed gas yet today but denies abdominal bloating, nausea, or vomiting. G tube is clamped. Feels like her breathing is better today as well.  Objective: Vital signs in last 24 hours: Temp:  [97.5 F (36.4 C)-98.4 F (36.9 C)] 98 F (36.7 C) (03/23 0731) Pulse Rate:  [77-91] 80 (03/23 0731) Resp:  [16-18] 18 (03/23 0731) BP: (116-129)/(70-83) 125/73 (03/23 0731) SpO2:  [91 %-100 %] 91 % (03/23 0731) Last BM Date : 02/15/23  Intake/Output from previous day: 03/22 0701 - 03/23 0700 In: 1715.6 [I.V.:1705.6] Out: 65 [Drains:65] Intake/Output this shift: Total I/O In: 3 [I.V.:3] Out: 0   PE: Gen: Alert, NAD, pleasant Resp: normal work of breathing on room air. GI: soft, nondistended, nontender, G tube clamped. JP x2, one serous and one serosanguinous  Lab Results:  Recent Labs    02/14/23 0401  WBC 7.3  HGB 10.6*  HCT 33.3*  PLT 457*   BMET Recent Labs    02/15/23 0305 02/16/23 0135  NA 137 133*  K 4.1 4.2  CL 107 106  CO2 24 21*  GLUCOSE 153* 141*  BUN 12 14  CREATININE 0.52 0.52  CALCIUM 8.1* 8.1*   PT/INR No results for input(s): "LABPROT", "INR" in the last 72 hours. CMP     Component Value Date/Time   NA 133 (L) 02/16/2023 0135   NA 142 11/29/2021 1048   K 4.2 02/16/2023 0135   CL 106 02/16/2023 0135   CO2 21 (L) 02/16/2023 0135   GLUCOSE 141 (H) 02/16/2023 0135   BUN 14 02/16/2023 0135   BUN 21 11/29/2021 1048   CREATININE 0.52 02/16/2023 0135   CALCIUM 8.1 (L) 02/16/2023 0135   PROT 5.0 (L) 02/14/2023 0401   ALBUMIN 2.2 (L) 02/14/2023 0401   AST 13 (L) 02/14/2023 0401   ALT 14 02/14/2023 0401   ALKPHOS 67 02/14/2023 0401   BILITOT 0.6 02/14/2023 0401   GFRNONAA >60 02/16/2023 0135   GFRAA >60 02/17/2018 1123    Lipase  No results found for: "LIPASE"     Studies/Results: IR GASTROSTOMY TUBE REMOVAL/REPAIR  Result Date: 02/16/2023 INDICATION: Malpositioning of surgically placed gastrostomy tube. Please perform fluoroscopic guided exchange/replacement/repositioning. EXAM: FLUOROSCOPIC GUIDED REPLACEMENT OF GASTROSTOMY TUBE COMPARISON:  CT abdomen and pelvis-02/15/2023 Upper GI series-02/14/2023 MEDICATIONS: None. CONTRAST:  60mL OMNIPAQUE IOHEXOL 300 MG/ML SOLN - administered into the gastric lumen FLUOROSCOPY TIME:  4 minutes, 18 seconds (25 mGy) ANESTHESIA/SEDATION: ANESTHESIA/SEDATION Moderate (conscious) sedation was employed during this procedure as administered by the Interventional Radiology RN. A total of Versed 1 mg and Fentanyl 50 mcg was administered intravenously. Moderate Sedation Time: 15 minutes. The patient's level of consciousness and vital signs were monitored continuously by radiology nursing throughout the procedure under my direct supervision. COMPLICATIONS: None immediate. PROCEDURE: Informed written consent was obtained from the patient after a discussion of the risks, benefits and alternatives to treatment. Questions regarding the procedure were encouraged and answered. A timeout was performed prior to the initiation of the procedure. The upper abdomen and external portion of the existing gastrostomy tube was prepped and draped in the usual sterile fashion, and a sterile drape was applied covering the operative field. Maximum barrier sterile technique with sterile gowns and gloves were used for the procedure. A timeout was  performed prior to the initiation of the procedure. The existing gastrostomy tube was injected with a small amount of contrast confirming malpositioning. As such, the external portion of the balloon retention gastrostomy tube was cut and the gastrostomy tube was removed intact purposes. A Christmas tree adapter was inserted via the gastrostomy tube track and contrast  injection was performed however failed to delineate patency of the prior track. As such, Kumpe catheter was inserted through the gastrostomy tube track and with the use of a stiff glidewire, ultimately advanced to the level of the gastric lumen. Contrast injection confirmed appropriate positioning. Multiple fluoroscopic images were obtained for procedural documentation Next, over a short Amplatz wire, a new 27 French balloon retention gastrostomy tube was inserted. The retention balloon was inflated with approximately 18 cc of saline and dilute contrast and pulled against the anterior inner wall of the gastric lumen. The external disc was cinched. Contrast injection confirmed appropriate positioning and functionality of the gastrostomy tube. A dressing was applied. The patient tolerated the procedure well without immediate postprocedural complication. IMPRESSION: Successful fluoroscopic guided replacement of a new 20-French gastrostomy tube. The gastrostomy tube is ready for immediate use. Electronically Signed   By: Sandi Mariscal M.D.   On: 02/16/2023 10:22   CT CHEST ABDOMEN PELVIS W CONTRAST  Result Date: 02/15/2023 CLINICAL DATA:  Status post gastrostomy tube placement. EXAM: CT CHEST, ABDOMEN, AND PELVIS WITH CONTRAST TECHNIQUE: Multidetector CT imaging of the chest, abdomen and pelvis was performed following the standard protocol during bolus administration of intravenous contrast. RADIATION DOSE REDUCTION: This exam was performed according to the departmental dose-optimization program which includes automated exposure control, adjustment of the mA and/or kV according to patient size and/or use of iterative reconstruction technique. CONTRAST:  13mL OMNIPAQUE IOHEXOL 350 MG/ML SOLN COMPARISON:  February 09, 2023. FINDINGS: CT CHEST FINDINGS Cardiovascular: No significant vascular findings. Normal heart size. No pericardial effusion. Mediastinum/Nodes: Large sliding-type hiatal hernia is noted with most of the  stomach in the thoracic space. Thyroid gland is unremarkable. No adenopathy is noted. Lungs/Pleura: No pneumothorax is noted. Right lung is clear. Large left sided hiatal hernia is again noted with most of the stomach noted in the thoracic space. Small left pleural effusion is noted with probable associated atelectasis of the left upper and lower lobes. There appears to be surgical drain entering the left lower quadrant of the abdomen which extends anteriorly through the abdominal space and appears to enter the thoracic space with distal tip seen anteriorly in the left lung apex. Musculoskeletal: No chest wall mass or suspicious bone lesions identified. CT ABDOMEN PELVIS FINDINGS Hepatobiliary: No cholelithiasis or biliary dilatation is noted. Stable hepatic cysts. Pancreas: Unremarkable. No pancreatic ductal dilatation or surrounding inflammatory changes. Spleen: Normal in size without focal abnormality. Adrenals/Urinary Tract: Adrenal glands are unremarkable. Kidneys are normal, without renal calculi, focal lesion, or hydronephrosis. Bladder is unremarkable. Stomach/Bowel: Gastrostomy tube is noted in left upper quadrant, but its tip is not within the gastric lumen. The balloon appears to be inflated within the anterior abdominal wall. Sigmoid diverticulosis is noted without inflammation. There is no evidence of bowel obstruction. The appendix is unremarkable. Vascular/Lymphatic: Aortic atherosclerosis. No enlarged abdominal or pelvic lymph nodes. Reproductive: Uterus is unremarkable. 3.8 cm left adnexal cyst is noted. Other: In addition to the surgical drain described in chest section, there is a nother drain entering the left lower quadrant with distal tip seen anteriorly in the left upper quadrant. No hernia or significant ascites is noted. Musculoskeletal:  No acute or significant osseous findings. IMPRESSION: The gastrostomy tube tip is anterior to the stomach and external to the gastric lumen. The balloon of  the distal catheter is inflated within the anterior abdominal wall musculature. Repositioning is recommended. There also has been interval placement of surgical drain entering left lower quadrant of abdomen which passes through the abdomen and into the left thoracic space, with distal tip noted anteriorly in the left lung apex. No definite pneumothorax is noted. These results will be called to the ordering clinician or representative by the Radiologist Assistant, and communication documented in the PACS or zVision Dashboard. Large sliding-type hiatal hernia is noted with most of the stomach noted within the left hemithorax. This results in subsegmental atelectasis of the left upper and lower lobes, with small left pleural effusion present as well. Sigmoid diverticulosis without inflammation. 3.8 cm left adnexal cyst. Recommend follow-up US in 6-12 months. Note: This recommendation does not apply to premenarchal patients and to those with increased risk (genetic, family history, elevated tumor markers or other high-risk factors) of ovarian cancer. Reference: JACR 2020 Feb; 17(2):248-254. Aortic Atherosclerosis (ICD10-I70.0). Electronically Signed   By: Marijo Conception M.D.   On: 02/15/2023 11:53   DG UGI W SINGLE CM (SOL OR THIN BA)  Result Date: 02/14/2023 CLINICAL DATA:  Patient with history of massive hiatal hernia and gastric volvulus. Patient underwent decompression of gastric volvulus on 02/09/2023, with reduction of part of the stomach back into the abdomen. Small anterior gastric perforation utilized intraoperatively for placement of a surgical gastrostomy tube. Current postoperative day 5, request to evaluate for leak assessment. EXAM: DG UGI W SINGLE CM TECHNIQUE: Scout radiograph was obtained. Single contrast examination was performed using water-soluble contrast medium. This exam was performed by Reatha Armour, PA-C , and was supervised and interpreted by Dr Sherryl Barters. FLUOROSCOPY: Radiation  Exposure Index (as provided by the fluoroscopic device): 15.5 mGy Kerma COMPARISON:  RF DG UGI W SINGLE CM 11/02/2022 FINDINGS: Scout Radiograph: Gastrostomy tube in the left upper quadrant. Drains project over the left upper quadrant including 1 that is noted to extend up towards the left lung apex. Levoconvex lumbar scoliosis with rotary component and substantial spondylosis and degenerative disc disease. No current dilated bowel. Esophagus: No definite anatomic abnormality although assessment is mildly limited due to secondary and tertiary contractions, and the focus of today's exam on the stomach. Esophageal motility: Nonspecific esophageal dysmotility disorder with extensive secondary and tertiary contractions and disruption of primary peristaltic waves in the upper thoracic esophagus. Gastroesophageal reflux:  None visualized. Ingested 50mm barium tablet:  Not given Stomach: Substantial remaining hiatal hernia containing a notable portion of the stomach in the left chest. Transit of contrast medium from the thoracic part of the stomach into the intraabdominal portion is sluggish although is shown on image 1 series 8. In order to achieve better filling of the stomach and also to assess the region of the gastrostomy tube, the gastrostomy tube was injected with water-soluble contrast medium. There is brisk flow of contrast back along the tract of the gastrostomy tube to the skin, with the majority of injected contrast entering the stomach but a substantial minority back tracking to the skin surface, as shown on cine images from frontal and lateral projections on series 12 and series 14. On lateral series 14, I am uncertain of the exact position of the gastrostomy tube components with respect to the stomach; the tip of the tube is believed to be in the stomach lumen  given the periphery in show filling of the stomach, but may barely be into the stomach for example on image 70 of series 14. Contrast tracks around a  small filling defect around the tube which may represent collapsed balloon outside the gastric lumen likewise on image 70 of series 14. The contrast tracks around the tube back to the skin surface. My sense related to images 1 through 58 of series 14 as at the contrast is tracking around the distal margin of the gastrostomy tube lumen back to the cutaneous surface, rather than through a crack or defect in the wall of the tube. On image 1 of series 15 we demonstrate filling of the distal stomach and proximal duodenum indicating no high-grade outlet obstruction. Gastric emptying: On series 15, contrast is visualized extending into the proximal duodenum. Duodenum: Proximal portion unremarkable, transverse and distal duodenum not assessed Other: On lateral projections, gas density volar to the lower stomach is probably an bowel. IMPRESSION: 1. Today's exam was performed using water-soluble contrast medium, first by having the patient take multiple swallows of contrast medium, and subsequently by direct injection of the gastrostomy tube. 2. The tip of the gastrostomy tube is believed to be just into the stomach lumen. When the gastrostomy tube is injected, there is flow of a substantial minority of the injected contrast back around the tube along the tract to the skin surface, and my suspicion is that this is leaking around the tube margin at the gastrostomy site. However, we did not demonstrate any leakage of contrast out into the abdominal cavity. Cannot exclude collapsed balloon portion of the gastrostomy tube anterior to the gastric wall (this could be better assessed by abdomen CT). 3. Substantial remaining hiatal hernia in the left chest, although improved compared to CT chest of 02/09/2023. Fairly sluggish flow of contrast from this capacious intrathoracic portion of the stomach into the intraabdominal portion of the stomach although occasional transit was shown, for example on series 8. 4. Nonspecific esophageal  dysmotility disorder. 5. Gas density volar to the lower stomach on lateral projections is probably an bowel loop rather than an extraluminal gas collection. 6. No high-grade outlet obstruction; contrast flow into the proximal duodenum is shown on series 15. Electronically Signed   By: Van Clines M.D.   On: 02/14/2023 15:59    Anti-infectives: Anti-infectives (From admission, onward)    Start     Dose/Rate Route Frequency Ordered Stop   02/09/23 0719  metroNIDAZOLE (FLAGYL) 500 MG/100ML IVPB       Note to Pharmacy: Rejeana Brock L: cabinet override      02/09/23 0719 02/09/23 0758   02/09/23 0530  ceFEPIme (MAXIPIME) 2 g in sodium chloride 0.9 % 100 mL IVPB        2 g 200 mL/hr over 30 Minutes Intravenous  Once 02/09/23 0520 02/09/23 0820   02/09/23 0530  metroNIDAZOLE (FLAGYL) IVPB 500 mg        500 mg 100 mL/hr over 60 Minutes Intravenous  Once 02/09/23 0520 02/09/23 0736        Assessment/Plan Recurrent hiatal hernia POD#7 S/P upper endo, laparoscopic reduction and G tube placement by Dr. Thermon Leyland 3/16  -  had some ischemic gastric mucosa. WBC normalized 3/20, VSS - UGI 3/21 with no gross leak into abdominal cavity but raised concern for part of gastrostomy tube to be anterior to the gastric wall. CT scan 3/22 revealed gastrostomy tube balloon outside the stomach - s/p IR gastrostomy tube exchange 3/22 - Having  bowel function. Keep G tube clamped and start clear liquids. JPs serous/serosanguinous - monitor. Continue TPN today.  - TCTS consulted - appreciate their evaluation - Encouraged ambulation and IS use   FEN - G tube clamped. CLD, Boost VTE - lovenox ID: [id number] x 1 dose 3/16   Hypothyroidism  Hypercholesteremia GERD Arthritis    LOS: 7 days    Monica White, [facility] 02/16/2023, 10:54 AM Please see Amion for pager number during day hours 7:00am-4:30pm

## 2023-02-16 NOTE — Plan of Care (Signed)

## 2023-02-16 NOTE — Progress Notes (Signed)
Mobility Specialist Progress Note   02/16/23 1210  Mobility  Activity Ambulated with assistance in hallway  Level of Assistance Contact guard assist, steadying assist  Assistive Device Front wheel walker  Distance Ambulated (ft) 100 ft (50+50)  Activity Response Tolerated well  Mobility Referral Yes  $Mobility charge 1 Mobility   Pre Mobility: 91% SpO2 on RA During Mobility: 93% SpO2 on RA Post Mobility: 93% SpO2 on RA  Pt coming out of BR after a successful BM agreeable to mobility. Pt requesting to ambulate w/ RW d/t feeling fatigued today. X1 standing rest break w/ pt feeling SOB but able to recover quickly w/ PLB. Returned back to bed w/o fault and all needs met.  Holland Falling Mobility Specialist Please contact via SecureChat or  Rehab office at [phone]

## 2023-02-16 NOTE — Progress Notes (Signed)
    G tube malpositioned IR exchange performed 3/22  Tolerated well  Site is clean and dry NT no bleeding Afeb Flushed with 10 cc saline---- no leaks; no pain  May use

## 2023-02-16 NOTE — Progress Notes (Signed)
PHARMACY - TOTAL PARENTERAL NUTRITION CONSULT NOTE  Indication:  Prolonged NPO status  Patient Measurements: Height: 5\' 7"  (170.2 cm) Weight: 57 kg (125 lb 11.2 oz) IBW/kg (Calculated) : 61.6 TPN AdjBW (KG): 59 Body mass index is 19.69 kg/m. Usual Weight: 62 kg  Assessment:  Pt presenting to [facility] on 3/16 for chest pain, vomiting and hernia symptoms. Pt underwent laparoscopic paraesophageal hernia repair with Toupet fundoplication on 0000000. Presented to ED on 11/02/22 with recurrent large hiatal hernia and underwent robotic reduction of hernia with G tube insertion. It was unable to be fully repaired due to scar tissue from previous surgery. G tube was dislodged on 11/12/22. Pt failed multiple attempts to decompress stomach with NG tube and underwent upper endoscopy with decompression of gastric volvulus and g tube placement. She has had minimal flatus passage and has been without nutrition for about 4 days and expected prolonged NPO status.  Pharmacy consulted to dose TPN.  Pt was eating a full regular diet PTA on 3/16 and her last full meal was the night of the 15th.  She typically eats 3 meals per day with a light snack. She drinks lifelyte nutrition supplements at home and typically drinks 1 a day. She does not get a good mix of veggies at home but does eat a lot of yogurt. She has had multiple GI surgeries and has lost weight since then. Based on chart review and pt account she was about 145 lbs before initial hernia repair and 136 pounds after subsequent admission and hernia surgery.   Per TCTS, possible transfer to [facility] for definitive repair.   Glucose / Insulin: no hx DM, A1c 5.5 in 2018, BG< 180, off SSI  Electrolytes: Na 133, CO2 21, CoCa 9.5, others wnl Renal: Scr 0.5 stable, BUN 14 Hepatic: 3/21: AST/ALT/Tbili/ TG WNL, albumin 2.2 Intake / Output; MIVF: UOP x11, drains 65 ml,  LBM 3/22 GI Imaging:  12/7 CT: progressed large left diaphragmatic hernia, no evidence of  bowel obstruction  2/3 CT: Large hiatal hernia/intrathoracic stomach without evidence of gastric volvulus.  3/16 CT: . Large hiatal hernia with intrathoracic stomach. The stomach is markedly distended with air and debris, volvulus can not be completely excluded 3/21 UGI: substantial hiatal hernia, nonsepcific esophageal dysmotility disorder, no obstruction 3/22 CT: GT malpositioned,  large hiatal hernia with most of the stomach in the L hemithorax GI Surgeries / Procedures:   11/06/22 Reduction of the incarcerated hiatal hernia and lap G tube insertion  3/16 Upper endoscopy, lap reduction, and G tube placement  3/22 IR Gastrostomy tube replacement and repositioning   Central access: PICC placed 02/13/23 TPN start date: 02/13/23  Nutritional Goals: RD Estimated Needs Total Energy Estimated Needs: 1700-1900 Total Protein Estimated Needs: 85-100g Total Fluid Estimated Needs: >/=1.7L  Current Nutrition:  TPN NPO except for ice chips  Plan:  Continue TPN at goal rate of 30mL/hr at 1800, providing 91g AA,  and 1774 kCal, meeting 100% of needs Electrolytes in TPN: Increase Na 100 mEq/L and Cl:Ac 1:2; Decrease K 40 mEq/L and Phos 10 mmol/L; Continue Ca 49mEq/L, Mg 63mEq/L. Add standard MVI and trace elements to TPN  Monitor TPN labs on Mon/Thurs  F/ut diet advancement, consider cycling TPN    Benetta Spar, PharmD, BCPS, BCCP Clinical Pharmacist  Please check AMION for all [place] phone numbers After 10:00 PM, call [place] [phone]

## 2023-02-16 NOTE — Progress Notes (Signed)
Pt tolerating liquid diet and boost w/o difficulty

## 2023-02-17 MED ORDER — HYDROMORPHONE HCL 1 MG/ML IJ SOLN: 0.5000 mg | INTRAMUSCULAR | Status: DC | PRN

## 2023-02-17 MED ORDER — DIPHENHYDRAMINE HCL 50 MG/ML IJ SOLN
12.5000 mg | Freq: Every evening | INTRAMUSCULAR | Status: DC | PRN
  Administered 2023-02-17: 12.5 mg via INTRAVENOUS
  Filled 2023-02-17: qty 1

## 2023-02-17 MED ORDER — OXYCODONE HCL 5 MG PO TABS: 5.0000 mg | ORAL_TABLET | ORAL | Status: DC | PRN

## 2023-02-17 MED ORDER — METHOCARBAMOL 500 MG PO TABS: 500.0000 mg | ORAL_TABLET | Freq: Four times a day (QID) | ORAL | Status: DC | PRN

## 2023-02-17 MED ORDER — TRAVASOL 10 % IV SOLN
INTRAVENOUS | Status: AC
  Filled 2023-02-17: qty 907.2

## 2023-02-17 NOTE — Plan of Care (Signed)

## 2023-02-17 NOTE — Progress Notes (Signed)
[facility] Progress Note  8 Days Post-Op  Subjective: CC-  Reports having multiple loose stools yesterday. That improved over night. Denies abdominal bloating, nausea, or vomiting. Tolerating clear liquids and likes the Boost. G tube remains clamped. States that she has not been able to sleep well in the hospital. It is not pain that keeps her up, just the bed and not being at home.  Objective: Vital signs in last 24 hours: Temp:  [97.4 F (36.3 C)-98.3 F (36.8 C)] 97.4 F (36.3 C) (03/24 0826) Pulse Rate:  [80-88] 82 (03/24 0826) Resp:  [16-18] 18 (03/24 0826) BP: (109-132)/(63-111) 126/111 (03/24 0826) SpO2:  [90 %-99 %] 96 % (03/24 0826) Last BM Date : 02/15/23  Intake/Output from previous day: 03/23 0701 - 03/24 0700 In: 2978.3 [P.O.:580; I.V.:2388.3] Out: 48 [Drains:48] Intake/Output this shift: No intake/output data recorded.  PE: Gen: Alert, NAD, pleasant Resp: normal work of breathing on room air. GI: soft, mild distension, nontender, G tube clamped. JP x2 serous  Lab Results:  No results for input(s): "WBC", "HGB", "HCT", "PLT" in the last 72 hours. BMET Recent Labs    02/15/23 0305 02/16/23 0135  NA 137 133*  K 4.1 4.2  CL 107 106  CO2 24 21*  GLUCOSE 153* 141*  BUN 12 14  CREATININE 0.52 0.52  CALCIUM 8.1* 8.1*   PT/INR No results for input(s): "LABPROT", "INR" in the last 72 hours. CMP     Component Value Date/Time   NA 133 (L) 02/16/2023 0135   NA 142 11/29/2021 1048   K 4.2 02/16/2023 0135   CL 106 02/16/2023 0135   CO2 21 (L) 02/16/2023 0135   GLUCOSE 141 (H) 02/16/2023 0135   BUN 14 02/16/2023 0135   BUN 21 11/29/2021 1048   CREATININE 0.52 02/16/2023 0135   CALCIUM 8.1 (L) 02/16/2023 0135   PROT 5.0 (L) 02/14/2023 0401   ALBUMIN 2.2 (L) 02/14/2023 0401   AST 13 (L) 02/14/2023 0401   ALT 14 02/14/2023 0401   ALKPHOS 67 02/14/2023 0401   BILITOT 0.6 02/14/2023 0401   GFRNONAA >60 02/16/2023 0135   GFRAA >60  02/17/2018 1123   Lipase  No results found for: "LIPASE"     Studies/Results: IR GASTROSTOMY TUBE REMOVAL/REPAIR  Result Date: 02/16/2023 INDICATION: Malpositioning of surgically placed gastrostomy tube. Please perform fluoroscopic guided exchange/replacement/repositioning. EXAM: FLUOROSCOPIC GUIDED REPLACEMENT OF GASTROSTOMY TUBE COMPARISON:  CT abdomen and pelvis-02/15/2023 Upper GI series-02/14/2023 MEDICATIONS: None. CONTRAST:  7mL OMNIPAQUE IOHEXOL 300 MG/ML SOLN - administered into the gastric lumen FLUOROSCOPY TIME:  4 minutes, 18 seconds (25 mGy) ANESTHESIA/SEDATION: ANESTHESIA/SEDATION Moderate (conscious) sedation was employed during this procedure as administered by the Interventional Radiology RN. A total of Versed 1 mg and Fentanyl 50 mcg was administered intravenously. Moderate Sedation Time: 15 minutes. The patient's level of consciousness and vital signs were monitored continuously by radiology nursing throughout the procedure under my direct supervision. COMPLICATIONS: None immediate. PROCEDURE: Informed written consent was obtained from the patient after a discussion of the risks, benefits and alternatives to treatment. Questions regarding the procedure were encouraged and answered. A timeout was performed prior to the initiation of the procedure. The upper abdomen and external portion of the existing gastrostomy tube was prepped and draped in the usual sterile fashion, and a sterile drape was applied covering the operative field. Maximum barrier sterile technique with sterile gowns and gloves were used for the procedure. A timeout was performed prior to the initiation of the procedure. The existing  gastrostomy tube was injected with a small amount of contrast confirming malpositioning. As such, the external portion of the balloon retention gastrostomy tube was cut and the gastrostomy tube was removed intact purposes. A Christmas tree adapter was inserted via the gastrostomy tube track  and contrast injection was performed however failed to delineate patency of the prior track. As such, Kumpe catheter was inserted through the gastrostomy tube track and with the use of a stiff glidewire, ultimately advanced to the level of the gastric lumen. Contrast injection confirmed appropriate positioning. Multiple fluoroscopic images were obtained for procedural documentation Next, over a short Amplatz wire, a new 8 French balloon retention gastrostomy tube was inserted. The retention balloon was inflated with approximately 18 cc of saline and dilute contrast and pulled against the anterior inner wall of the gastric lumen. The external disc was cinched. Contrast injection confirmed appropriate positioning and functionality of the gastrostomy tube. A dressing was applied. The patient tolerated the procedure well without immediate postprocedural complication. IMPRESSION: Successful fluoroscopic guided replacement of a new 20-French gastrostomy tube. The gastrostomy tube is ready for immediate use. Electronically Signed   By: Sandi Mariscal M.D.   On: 02/16/2023 10:22   CT CHEST ABDOMEN PELVIS W CONTRAST  Result Date: 02/15/2023 CLINICAL DATA:  Status post gastrostomy tube placement. EXAM: CT CHEST, ABDOMEN, AND PELVIS WITH CONTRAST TECHNIQUE: Multidetector CT imaging of the chest, abdomen and pelvis was performed following the standard protocol during bolus administration of intravenous contrast. RADIATION DOSE REDUCTION: This exam was performed according to the departmental dose-optimization program which includes automated exposure control, adjustment of the mA and/or kV according to patient size and/or use of iterative reconstruction technique. CONTRAST:  50mL OMNIPAQUE IOHEXOL 350 MG/ML SOLN COMPARISON:  February 09, 2023. FINDINGS: CT CHEST FINDINGS Cardiovascular: No significant vascular findings. Normal heart size. No pericardial effusion. Mediastinum/Nodes: Large sliding-type hiatal hernia is noted with  most of the stomach in the thoracic space. Thyroid gland is unremarkable. No adenopathy is noted. Lungs/Pleura: No pneumothorax is noted. Right lung is clear. Large left sided hiatal hernia is again noted with most of the stomach noted in the thoracic space. Small left pleural effusion is noted with probable associated atelectasis of the left upper and lower lobes. There appears to be surgical drain entering the left lower quadrant of the abdomen which extends anteriorly through the abdominal space and appears to enter the thoracic space with distal tip seen anteriorly in the left lung apex. Musculoskeletal: No chest wall mass or suspicious bone lesions identified. CT ABDOMEN PELVIS FINDINGS Hepatobiliary: No cholelithiasis or biliary dilatation is noted. Stable hepatic cysts. Pancreas: Unremarkable. No pancreatic ductal dilatation or surrounding inflammatory changes. Spleen: Normal in size without focal abnormality. Adrenals/Urinary Tract: Adrenal glands are unremarkable. Kidneys are normal, without renal calculi, focal lesion, or hydronephrosis. Bladder is unremarkable. Stomach/Bowel: Gastrostomy tube is noted in left upper quadrant, but its tip is not within the gastric lumen. The balloon appears to be inflated within the anterior abdominal wall. Sigmoid diverticulosis is noted without inflammation. There is no evidence of bowel obstruction. The appendix is unremarkable. Vascular/Lymphatic: Aortic atherosclerosis. No enlarged abdominal or pelvic lymph nodes. Reproductive: Uterus is unremarkable. 3.8 cm left adnexal cyst is noted. Other: In addition to the surgical drain described in chest section, there is a nother drain entering the left lower quadrant with distal tip seen anteriorly in the left upper quadrant. No hernia or significant ascites is noted. Musculoskeletal: No acute or significant osseous findings. IMPRESSION: The gastrostomy tube  tip is anterior to the stomach and external to the gastric lumen. The  balloon of the distal catheter is inflated within the anterior abdominal wall musculature. Repositioning is recommended. There also has been interval placement of surgical drain entering left lower quadrant of abdomen which passes through the abdomen and into the left thoracic space, with distal tip noted anteriorly in the left lung apex. No definite pneumothorax is noted. These results will be called to the ordering clinician or representative by the Radiologist Assistant, and communication documented in the PACS or zVision Dashboard. Large sliding-type hiatal hernia is noted with most of the stomach noted within the left hemithorax. This results in subsegmental atelectasis of the left upper and lower lobes, with small left pleural effusion present as well. Sigmoid diverticulosis without inflammation. 3.8 cm left adnexal cyst. Recommend follow-up US in 6-12 months. Note: This recommendation does not apply to premenarchal patients and to those with increased risk (genetic, family history, elevated tumor markers or other high-risk factors) of ovarian cancer. Reference: JACR 2020 Feb; 17(2):248-254. Aortic Atherosclerosis (ICD10-I70.0). Electronically Signed   By: Marijo Conception M.D.   On: 02/15/2023 11:53    Anti-infectives: Anti-infectives (From admission, onward)    Start     Dose/Rate Route Frequency Ordered Stop   02/09/23 0719  metroNIDAZOLE (FLAGYL) 500 MG/100ML IVPB       Note to Pharmacy: Rejeana Brock L: cabinet override      02/09/23 0719 02/09/23 0758   02/09/23 0530  ceFEPIme (MAXIPIME) 2 g in sodium chloride 0.9 % 100 mL IVPB        2 g 200 mL/hr over 30 Minutes Intravenous  Once 02/09/23 0520 02/09/23 0820   02/09/23 0530  metroNIDAZOLE (FLAGYL) IVPB 500 mg        500 mg 100 mL/hr over 60 Minutes Intravenous  Once 02/09/23 0520 02/09/23 0736        Assessment/Plan Recurrent hiatal hernia POD#8 S/P upper endo, laparoscopic reduction and G tube placement by Dr. Thermon Leyland 3/16  -   had some ischemic gastric mucosa. WBC normalized 3/20, VSS - UGI 3/21 with no gross leak into abdominal cavity but raised concern for part of gastrostomy tube to be anterior to the gastric wall. CT scan 3/22 revealed gastrostomy tube balloon outside the stomach - s/p IR gastrostomy tube exchange 3/22 - TCTS consulted - appreciate their evaluation - Encouraged ambulation and IS use. PT recs no follow up - Advance to full liquids. Transition to oral medications. Keep G tube clamped. JPs serous - monitor. If tolerating this can likely start weaning TPN tomorrow. Melatonin does not work for her so will order benadryl PRN sleep.   FEN - G tube clamped. FLD, Boost VTE - lovenox ID: [email or web address] x 1 dose 3/16   Hypothyroidism  Hypercholesteremia GERD Arthritis    LOS: 8 days    Wellington [place], [facility] 02/17/2023, 8:51 AM Please see Amion for pager number during day hours 7:00am-4:30pm

## 2023-02-17 NOTE — Progress Notes (Signed)
Just spoke with pt's son Monica White in the room. Monica White requests for Dr. Thermon Leyland to call him in regards of the pt's cont. Care. I told him that I would write this note and let the PM nurse know to pass above on to tomorrow AM nurse

## 2023-02-17 NOTE — Progress Notes (Signed)
PHARMACY - TOTAL PARENTERAL NUTRITION CONSULT NOTE  Indication:  Prolonged NPO status d/t recurrent hiatal hernia   Patient Measurements: Height: 5\' 7"  (170.2 cm) Weight: 57 kg (125 lb 11.2 oz) IBW/kg (Calculated) : 61.6 TPN AdjBW (KG): 59 Body mass index is 19.69 kg/m. Usual Weight: 65 kg  Assessment:  Pt presenting to [facility] on 3/16 for chest pain, vomiting and hernia symptoms. Pt underwent laparoscopic paraesophageal hernia repair with Toupet fundoplication on 0000000. Presented to ED on 11/02/22 with recurrent large hiatal hernia and underwent robotic reduction of hernia with G tube insertion. It was unable to be fully repaired due to scar tissue from previous surgery. G tube was dislodged on 11/12/22. Pt failed multiple attempts to decompress stomach with NG tube and underwent upper endoscopy with decompression of gastric volvulus and g tube placement. She has had minimal flatus passage and has been without nutrition for about 4 days and expected prolonged NPO status.  Pharmacy consulted to dose TPN.  Pt was eating a full regular diet PTA on 3/16 and her last full meal was the night of the 15th.  She typically eats 3 meals per day with a light snack. She drinks lifelyte nutrition supplements at home and typically drinks 1 a day. She does not get a good mix of veggies at home but does eat a lot of yogurt. She has had multiple GI surgeries and has lost weight since then. Based on chart review and pt account she was about 145 lbs before initial hernia repair and 136 pounds after subsequent admission and hernia surgery.    Glucose / Insulin: no hx DM, A1c 5.5 in 2018, BG < 180, off SSI  Electrolytes: (last 3/23) Na 133, CO2 21, CoCa 9.5, others wnl Renal: Scr 0.5 stable, BUN 14 Hepatic: 3/21: AST/ALT/Tbili/ TG WNL, albumin 2.2 Intake / Output; MIVF: UOP x5 charted, drains 48 ml,  LBM 3/23 GI Imaging:  12/7 CT: progressed large left diaphragmatic hernia, no evidence of bowel obstruction  2/3  CT: Large hiatal hernia/intrathoracic stomach without evidence of gastric volvulus.  3/16 CT: . Large hiatal hernia with intrathoracic stomach. The stomach is markedly distended with air and debris, volvulus can not be completely excluded 3/21 UGI: substantial hiatal hernia, nonsepcific esophageal dysmotility disorder, no obstruction 3/22 CT: GT malpositioned,  large hiatal hernia with most of the stomach in the L hemithorax GI Surgeries / Procedures:   11/06/22 Reduction of the incarcerated hiatal hernia and lap G tube insertion  3/16 Upper endoscopy, lap reduction, and G tube placement  3/22 IR Gastrostomy tube replacement and repositioning   Central access: PICC placed 02/13/23 TPN start date: 02/13/23  Nutritional Goals: RD Estimated Needs Total Energy Estimated Needs: 1700-1900 Total Protein Estimated Needs: 85-100g Total Fluid Estimated Needs: >/=1.7L  Current Nutrition:  TPN 3/23 CLD- tolerated clears and Boost   Plan:  Continue TPN at goal rate of 80mL/hr at 1800, providing 91g AA,  and 1774 kCal, meeting 100% of needs Electrolytes in TPN:  Na 100 mEq/L, K 40 mEq/L, Ca 5 mEq/L, Mg 5 mEq/L, Phos 10 mmol/L, Cl:Ac 1:2 Add standard MVI and trace elements to TPN  Monitor TPN labs on Mon/Thurs  F/ut diet advancement, wean TPN as able   Benetta Spar, PharmD, BCPS, BCCP Clinical Pharmacist  Please check AMION for all [place] phone numbers After 10:00 PM, call [place]

## 2023-02-18 LAB — COMPREHENSIVE METABOLIC PANEL
ALT: 17 U/L (ref 0–44)
AST: 20 U/L (ref 15–41)
Albumin: 2.4 g/dL — ABNORMAL LOW (ref 3.5–5.0)
Alkaline Phosphatase: 80 U/L (ref 38–126)
Anion gap: 8 (ref 5–15)
BUN: 15 mg/dL (ref 8–23)
CO2: 27 mmol/L (ref 22–32)
Calcium: 8.7 mg/dL — ABNORMAL LOW (ref 8.9–10.3)
Chloride: 102 mmol/L (ref 98–111)
Creatinine, Ser: 0.61 mg/dL (ref 0.44–1.00)
GFR, Estimated: >60 mL/min (ref >60)
Glucose, Bld: 112 mg/dL — ABNORMAL HIGH (ref 70–99)
Potassium: 3.8 mmol/L (ref 3.5–5.1)
Sodium: 137 mmol/L (ref 135–145)
Total Bilirubin: 0.3 mg/dL (ref 0.3–1.2)
Total Protein: 5.5 g/dL — ABNORMAL LOW (ref 6.5–8.1)

## 2023-02-18 LAB — TRIGLYCERIDES: Triglycerides: 121 mg/dL (ref <150)

## 2023-02-18 LAB — MAGNESIUM: Magnesium: 1.9 mg/dL (ref 1.7–2.4)

## 2023-02-18 LAB — PHOSPHORUS: Phosphorus: 3.3 mg/dL (ref 2.5–4.6)

## 2023-02-18 MED ORDER — MELATONIN 3 MG PO TABS: 3.0000 mg | ORAL_TABLET | Freq: Every day | ORAL | Status: DC

## 2023-02-18 MED ORDER — ZOLPIDEM TARTRATE 5 MG PO TABS
5.0000 mg | ORAL_TABLET | Freq: Every evening | ORAL | Status: DC | PRN
  Administered 2023-02-18: 5 mg via ORAL
  Filled 2023-02-18: qty 1

## 2023-02-18 MED ORDER — TRAVASOL 10 % IV SOLN
INTRAVENOUS | Status: DC
  Filled 2023-02-18: qty 451.2

## 2023-02-18 NOTE — Care Management Important Message (Signed)
Important Message  Patient Details  Name: Monica White MRN: [id number] Date of Birth: Jan 19, 1941   Medicare Important Message Given:  Yes     [name] 02/18/2023, 3:24 PM

## 2023-02-18 NOTE — TOC Progression Note (Addendum)
Transition of Care [facility]) - Progression Note    Patient Details  Name: Monica White MRN: [id number] Date of Birth: [birth year]  Transition of Care [facility]) CM/SW Contact  Curlene Labrum, RN Phone Number: 02/18/2023, 2:56 PM  Clinical Narrative:    CM met with the patient at the bedside to discuss transitions of care to home once she has advanced her diet and has been weaned from the TPN needs through the PICC line.  The patient was provided with choice regarding home health and DME and she did not have a preference.  The patient states that she is agreeable to use a RW when she returns home since her gait has been improving but unsteady.  RW ordered and [employer] was called to deliver to the patient's hospital room.  The patient states that she plans to return to her daughter's home in [place] at address:  [address] [place], [place] [address].  The patient was agreeable to home health services for RN.  I called Tommi Rumps, CM with [facility] and he accepted for [facility] services for [place] area and [place], [place] when she return home after staying with her daughter.  The patient has 2 JP drains at this time and a clamped G-tube at this time.  The patient's family plans to transport the patient to her daughter's home when she is medically stable for discharge.     Expected Discharge Plan: [place] Barriers to Discharge: Continued Medical Work up  Expected Discharge Plan and Services   Discharge Planning Services: CM Consult Post Acute Care Choice: [place] arrangements for the past 2 months: [place]: RN [place] Agency: [place] Date [place]: 02/15/23 Time [place]: [id number] Representative spoke with at [place]: Will contact Tommi Rumps, [place] with [facility] to follow for possible HH for RN   Social Determinants of Health (SDOH) Interventions SDOH Screenings   Food  Insecurity: No Food Insecurity (11/05/2022)  Housing: Low Risk  (11/05/2022)  Transportation Needs: No Transportation Needs (11/05/2022)  Utilities: Not At Risk (11/05/2022)  Tobacco Use: Low Risk  (02/16/2023)    Readmission Risk Interventions    02/15/2023   10:48 AM  Readmission Risk Prevention Plan  Transportation Screening Complete  PCP or Specialist Appt within 5-7 Days Complete  Home Care Screening Complete  Medication Review (RN CM) Complete

## 2023-02-18 NOTE — Progress Notes (Signed)
Physical Therapy Treatment Patient Details Name: Monica White MRN: [id number] DOB: [birth year] Today's Date: 02/18/2023   History of Present Illness 82 y.o. female presents to [facility] hospital on 02/09/2023 with chest pain, vomiting. Pt admitted for management of gastric volvulus due to recurrent hiatal hernia. Pt underwent upper endoscopy and laparoscopic reduction with G tube placement on 3/16. PMH includes anemia, OA, DOE, GERD.    PT Comments    Patient progressing well towards PT goals. Session focused on progressive ambulation and endurance. Pt reports not sleeping well here and eager to return home to her daughter's house where she can sleep better, eat better and move around. Tolerated ambulation with min guard progressing to supervision for safety with 1 seated and 1 standing rest break due to SOB and fatigue. Mild instability noted. Continue to recommend walking/mobility while in the hospital as pt feels far from functional baseline. Will continue to work on strengthening, endurance and balance. Will follow.   Recommendations for follow up therapy are one component of a multi-disciplinary discharge planning process, led by the attending physician.  Recommendations may be updated based on patient status, additional functional criteria and insurance authorization.  Follow Up Recommendations       Assistance Recommended at Discharge PRN  Patient can return home with the following Assistance with cooking/housework;Assist for transportation;Help with stairs or ramp for entrance   Equipment Recommendations  None recommended by PT    Recommendations for Other Services       Precautions / Restrictions Precautions Precautions: Fall Precaution Comments: JP x2, gastric drain, TPN Restrictions Weight Bearing Restrictions: No     Mobility  Bed Mobility Overal bed mobility: Modified Independent Bed Mobility: Supine to Sit, Sit to Supine     Supine to sit: Modified independent  (Device/Increase time), HOB elevated Sit to supine: Modified independent (Device/Increase time), HOB elevated   General bed mobility comments: No assist needed.    Transfers Overall transfer level: Needs assistance Equipment used: None Transfers: Sit to/from Stand Sit to Stand: Supervision           General transfer comment: SUpervision for safety. Stood from [employer], from chair x1.    Ambulation/Gait Ambulation/Gait assistance: Supervision, Min guard Gait Distance (Feet): 250 Feet (+ 50') Assistive device: None Gait Pattern/deviations: Step-through pattern, Drifts right/left Gait velocity: decreased Gait velocity interpretation: <1.8 ft/sec, indicate of risk for recurrent falls   General Gait Details: Slow, step through gait with flexed knees/hips, guarding throughout holding up chest due to heaviness. 2-3/4 DOE. 1 seated rest break and 1 standing rest break.   Stairs             Wheelchair Mobility    Modified Rankin (Stroke Patients Only)       Balance Overall balance assessment: Needs assistance Sitting-balance support: No upper extremity supported, Feet supported Sitting balance-Leahy Scale: Good     Standing balance support: During functional activity Standing balance-Leahy Scale: Fair Standing balance comment: able to ambualte without UE support wth min guard as pt with some drift R/L and mild instability                            Cognition Arousal/Alertness: Awake/alert Behavior During Therapy: WFL for tasks assessed/performed Overall Cognitive Status: Within Functional Limits for tasks assessed  General Comments: Pt upset that she has not slept well recently and the food is not good and really wanting to go home        Exercises      General Comments General comments (skin integrity, edema, etc.): VSS on RA despite SOB.      Pertinent Vitals/Pain Pain Assessment Pain Assessment:  No/denies pain    Home Living                          Prior Function            PT Goals (current goals can now be found in the care plan section) Progress towards PT goals: Progressing toward goals    Frequency    Min 3X/week      PT Plan Current plan remains appropriate    Co-evaluation              AM-PAC PT "6 Clicks" Mobility   Outcome Measure  Help needed turning from your back to your side while in a flat bed without using bedrails?: None Help needed moving from lying on your back to sitting on the side of a flat bed without using bedrails?: None Help needed moving to and from a bed to a chair (including a wheelchair)?: A Little Help needed standing up from a chair using your arms (e.g., wheelchair or bedside chair)?: A Little Help needed to walk in hospital room?: A Little Help needed climbing 3-5 steps with a railing? : A Lot 6 Click Score: 19    End of Session Equipment Utilized During Treatment: Gait belt Activity Tolerance: Patient tolerated treatment well Patient left: in bed;with call bell/phone within reach Nurse Communication: Mobility status PT Visit Diagnosis: Other abnormalities of gait and mobility (R26.89);Muscle weakness (generalized) (M62.81)     Time: [id number] PT Time Calculation (min) (ACUTE ONLY): 23 min  Charges:  $Gait Training: 8-22 mins $Therapeutic Exercise: 8-22 mins                     Marisa Severin, PT, DPT Acute Rehabilitation Services Secure chat preferred Office [place] 02/18/2023, 9:40 AM

## 2023-02-18 NOTE — Progress Notes (Signed)
[facility] Progress Note  9 Days Post-Op  Subjective: CC-  Doing well. Some pain near her G-tube at times but otherwise no abdominal pain. Tolerating FLD without n/v. Drank 1 boost yesterday. Passing flatus. Loose stool x1 yesterday and x 1 this am.   Afebrile. No tachycardia or hypotension.   Objective: Vital signs in last 24 hours: Temp:  [97.5 F (36.4 C)-98.3 F (36.8 C)] 98.1 F (36.7 C) (03/25 0819) Pulse Rate:  [85-90] 85 (03/25 0819) Resp:  [17-18] 18 (03/25 0819) BP: (112-133)/(73-87) 112/73 (03/25 0819) SpO2:  [94 %-97 %] 95 % (03/25 0819) Last BM Date : 02/15/23  Intake/Output from previous day: 03/24 0701 - 03/25 0700 In: 1890 [P.O.:690; I.V.:1170] Out: 41 [Drains:41] Intake/Output this shift: No intake/output data recorded.  PE: Gen: Alert, NAD, pleasant GI: Soft, mild distension, nontender, G tube clamped. JP x2 SS (drain 1 - 6cc/24 hours, drain 2 - 35cc/24 hours)  Lab Results:  No results for input(s): "WBC", "HGB", "HCT", "PLT" in the last 72 hours. BMET Recent Labs    02/16/23 0135 02/18/23 0506  NA 133* 137  K 4.2 3.8  CL 106 102  CO2 21* 27  GLUCOSE 141* 112*  BUN 14 15  CREATININE 0.52 0.61  CALCIUM 8.1* 8.7*    PT/INR No results for input(s): "LABPROT", "INR" in the last 72 hours. CMP     Component Value Date/Time   NA 137 02/18/2023 0506   NA 142 11/29/2021 1048   K 3.8 02/18/2023 0506   CL 102 02/18/2023 0506   CO2 27 02/18/2023 0506   GLUCOSE 112 (H) 02/18/2023 0506   BUN 15 02/18/2023 0506   BUN 21 11/29/2021 1048   CREATININE 0.61 02/18/2023 0506   CALCIUM 8.7 (L) 02/18/2023 0506   PROT 5.5 (L) 02/18/2023 0506   ALBUMIN 2.4 (L) 02/18/2023 0506   AST 20 02/18/2023 0506   ALT 17 02/18/2023 0506   ALKPHOS 80 02/18/2023 0506   BILITOT 0.3 02/18/2023 0506   GFRNONAA >60 02/18/2023 0506   GFRAA >60 02/17/2018 1123   Lipase  No results found for: "LIPASE"     Studies/Results: No results  found.  Anti-infectives: Anti-infectives (From admission, onward)    Start     Dose/Rate Route Frequency Ordered Stop   02/09/23 0719  metroNIDAZOLE (FLAGYL) 500 MG/100ML IVPB       Note to Pharmacy: Rejeana Brock L: cabinet override      02/09/23 0719 02/09/23 0758   02/09/23 0530  ceFEPIme (MAXIPIME) 2 g in sodium chloride 0.9 % 100 mL IVPB        2 g 200 mL/hr over 30 Minutes Intravenous  Once 02/09/23 0520 02/09/23 0820   02/09/23 0530  metroNIDAZOLE (FLAGYL) IVPB 500 mg        500 mg 100 mL/hr over 60 Minutes Intravenous  Once 02/09/23 0520 02/09/23 0736        Assessment/Plan Recurrent hiatal hernia POD#9 S/P upper endo, laparoscopic reduction and G tube placement by Dr. Thermon Leyland 3/16  - Intra-op found to have some ischemic gastric mucosa. WBC normalized 3/20, VSS - UGI 3/21 with no gross leak into abdominal cavity but raised concern for part of gastrostomy tube to be anterior to the gastric wall. CT scan 3/22 revealed gastrostomy tube balloon outside the stomach - S/p IR gastrostomy tube exchange 3/22 - Advance to soft diet. Encourage shakes. 1/2 TPN.  Keep G tube clamped. May be able to wean off TPN in AM.  - JPs  serous - monitor. Likely can be removed prior to discharge.  - Encouraged ambulation and IS use. PT recs no follow up - TCTS consulted during admission. - appreciate their evaluation. Will need f/u with them at discharge to discuss definitive surgery if family would like to proceed with this.  - I have updated the patient's son, [name], over speaker phone while I was in the room per the patient's request  FEN - G tube clamped. Soft diet, Boost. 1/2 TPN VTE - SCds, Lovenox ID: [email or web address] x 1 dose 3/16. None currently.    Hypothyroidism  Hypercholesteremia GERD Arthritis   LOS: 9 days    [name], [facility] 02/18/2023, 9:21 AM Please see Amion for pager number during day hours 7:00am-4:30pm

## 2023-02-18 NOTE — Progress Notes (Signed)
Mobility Specialist - Progress Note   02/18/23 1046  Mobility  Activity Ambulated with assistance in hallway  Level of Assistance Modified independent, requires aide device or extra time  Assistive Device Other (Comment) (IV pole)  Distance Ambulated (ft) 200 ft  Activity Response Tolerated well  Mobility Referral Yes  $Mobility charge 1 Mobility   Pt was received existing BR and agreeable to mobility. Pt c/o being tired throughout ambulation. Pt needed x1 standing rest break d/t fatigue. Upon returning to bed pt abdominal wound dressing came off. RN was notified. Pt left in bed with all needs met.   Franki Monte  Mobility Specialist Please contact via Solicitor or Rehab office at [phone]

## 2023-02-18 NOTE — Care Plan (Signed)
Patient AxOx4, pleasant, hopes to go home tomorrow. Dressings changed on the abdomen. 4 ml serous drainage in JP 1 and 20 ml serous drainage in JP 2. Patient ambulated independently multiple times.

## 2023-02-18 NOTE — Progress Notes (Cosign Needed)
PHARMACY - TOTAL PARENTERAL NUTRITION CONSULT NOTE  Indication:  Prolonged NPO status d/t recurrent hiatal hernia   Patient Measurements: Height: 5\' 7"  (170.2 cm) Weight: 57 kg (125 lb 11.2 oz) IBW/kg (Calculated) : 61.6 TPN AdjBW (KG): 59 Body mass index is 19.69 kg/m. Usual Weight: 65 kg  Assessment:  Pt presenting to [facility] on 3/16 for chest pain, vomiting and hernia symptoms. Pt underwent laparoscopic paraesophageal hernia repair with Toupet fundoplication on 0000000. Presented to ED on 11/02/22 with recurrent large hiatal hernia and underwent robotic reduction of hernia with G tube insertion. It was unable to be fully repaired due to scar tissue from previous surgery. G tube was dislodged on 11/12/22. Pt failed multiple attempts to decompress stomach with NG tube and underwent upper endoscopy with decompression of gastric volvulus and g tube placement. She has had minimal flatus passage and has been without nutrition for about 4 days and expected prolonged NPO status.  Pharmacy consulted to dose TPN.  Pt was eating a full regular diet PTA on 3/16 and her last full meal was the night of the 15th.  She typically eats 3 meals per day with a light snack. She drinks lifelyte nutrition supplements at home and typically drinks 1 a day. She does not get a good mix of veggies at home but does eat a lot of yogurt. She has had multiple GI surgeries and has lost weight since then. Based on chart review and pt account she was about 145 lbs before initial hernia repair and 136 pounds after subsequent admission and hernia surgery.   Glucose / Insulin: no hx DM, A1c 5.5 in 2018, BG < 180, off SSI  Electrolytes: All WNL, K 3.8 Renal: Scr <1, BUN WNL Hepatic: AST/ALT/Tbili/ TG WNL, albumin 2.4 Intake / Output; MIVF: UOP none documented, drains 41 ml, LBM 3/24 GI Imaging:  12/7 CT: progressed large left diaphragmatic hernia, no evidence of bowel obstruction  2/3 CT: Large hiatal hernia/intrathoracic stomach  without evidence of gastric volvulus.  3/16 CT: . Large hiatal hernia with intrathoracic stomach. The stomach is markedly distended with air and debris, volvulus can not be completely excluded 3/21 UGI: substantial hiatal hernia, nonsepcific esophageal dysmotility disorder, no obstruction 3/22 CT: GT malpositioned,  large hiatal hernia with most of the stomach in the L hemithorax GI Surgeries / Procedures:   11/06/22 Reduction of the incarcerated hiatal hernia and lap G tube insertion  3/16 Upper endoscopy, lap reduction, and G tube placement  3/22 IR Gastrostomy tube replacement and repositioning   Central access: PICC placed 02/13/23 TPN start date: 02/13/23  Nutritional Goals: RD Estimated Needs Total Energy Estimated Needs: 1700-1900 Total Protein Estimated Needs: 85-100g Total Fluid Estimated Needs: >/=1.7L  Current Nutrition:  TPN Boost Breeze feeding supplement- 3 of 3 given yesterday 3/23 CLD- tolerating CLD well, ate ~40-50% of meals yesterday  Plan:  Continue TPN at goal rate of 21mL/hr at 1800, providing 91g AA,  and 1774 kCal, meeting 100% of needs Electrolytes in TPN:  Na 100 mEq/L, increase K 45 mEq/L, Ca 5 mEq/L, Mg 5 mEq/L, Phos 10 mmol/L, Cl:Ac 1:2 Add standard MVI and trace elements to TPN  Monitor TPN labs on Mon/Thurs-plan labs 3/27 F/ut diet advancement, wean TPN as able   Rodolph Bong, PharmD Candidate 02/18/2023 6:35 AM

## 2023-02-18 NOTE — Progress Notes (Signed)
PHARMACY - TOTAL PARENTERAL NUTRITION CONSULT NOTE  Indication:  Prolonged NPO status d/t recurrent hiatal hernia   Patient Measurements: Height: 5\' 7"  (170.2 cm) Weight: 57 kg (125 lb 11.2 oz) IBW/kg (Calculated) : 61.6 TPN AdjBW (KG): 59 Body mass index is 19.69 kg/m. Usual Weight: 65 kg  Assessment:  Pt presenting to [facility] on 3/16 for chest pain, vomiting and hernia symptoms. Pt underwent laparoscopic paraesophageal hernia repair with Toupet fundoplication on 0000000. Presented to ED on 11/02/22 with recurrent large hiatal hernia and underwent robotic reduction of hernia with G tube insertion. It was unable to be fully repaired due to scar tissue from previous surgery. G tube was dislodged on 11/12/22. Pt failed multiple attempts to decompress stomach with NG tube and underwent upper endoscopy with decompression of gastric volvulus and g tube placement. She has had minimal flatus passage and has been without nutrition for about 4 days and expected prolonged NPO status.  Pharmacy consulted to dose TPN.  Pt was eating a full regular diet PTA on 3/16 and her last full meal was the night of the 15th.  She typically eats 3 meals per day with a light snack. She drinks lifelyte nutrition supplements at home and typically drinks 1 a day. She does not get a good mix of veggies at home but does eat a lot of yogurt. She has had multiple GI surgeries and has lost weight since then. Based on chart review and pt account she was about 145 lbs before initial hernia repair and 136 pounds after subsequent admission and hernia surgery.   Glucose / Insulin: no hx DM, A1c 5.5 in 2018, BG < 180, off SSI  Electrolytes: All WNL, K 3.8 Renal: Scr <1, BUN WNL Hepatic: AST/ALT/Tbili/ TG WNL, albumin 2.4 Intake / Output; MIVF: UOP none documented, drains 41 ml, LBM 3/24 GI Imaging:  12/7 CT: progressed large left diaphragmatic hernia, no evidence of bowel obstruction  2/3 CT: Large hiatal hernia/intrathoracic stomach  without evidence of gastric volvulus.  3/16 CT: . Large hiatal hernia with intrathoracic stomach. The stomach is markedly distended with air and debris, volvulus can not be completely excluded 3/21 UGI: substantial hiatal hernia, nonsepcific esophageal dysmotility disorder, no obstruction 3/22 CT: GT malpositioned,  large hiatal hernia with most of the stomach in the L hemithorax GI Surgeries / Procedures:   11/06/22 Reduction of the incarcerated hiatal hernia and lap G tube insertion  3/16 Upper endoscopy, lap reduction, and G tube placement  3/22 IR Gastrostomy tube replacement and repositioning   Central access: PICC placed 02/13/23 TPN start date: 02/13/23  Nutritional Goals: RD Estimated Needs Total Energy Estimated Needs: 1700-1900 Total Protein Estimated Needs: 85-100g Total Fluid Estimated Needs: >/=1.7L Goal TPN:  7mL/hr to provide providing 91g AA and 1774 kCal  Current Nutrition:  TPN Boost Breeze feeding supplement- 3 of 3 given yesterday CLD started 3/23 - tolerating, ate ~40-50% of meals yesterday  Plan:  Half TPN and advance to soft diet per MD Reduce TPN to 40 ml/hr at 1800 to provide 45g AA and 899 kCal Electrolytes in TPN:  Na 150 mEq/L, increase K 80 mEq/L, Ca 5 mEq/L, Mg 10 mEq/L, Phos 17 mmol/L, Cl:Ac 1:2 - best match with reduced TPN rate Add standard MVI and trace elements to TPN  Monitor TPN labs on Mon/Thurs - repeat labs 3/27 if still on TPN F/u PO intake to wean off of TPN  Rodolph Bong, PharmD Candidate 02/18/2023 10:04 AM  Remigio Eisenmenger D. Mina Marble, PharmD, BCPS, [place]  02/18/2023, 10:14 AM

## 2023-02-19 MED ORDER — OXYCODONE HCL 5 MG PO TABS: 5.0000 mg | ORAL_TABLET | Freq: Four times a day (QID) | ORAL | 0 refills | Status: AC | PRN

## 2023-02-19 MED ORDER — PANTOPRAZOLE SODIUM 40 MG PO TBEC
40.0000 mg | DELAYED_RELEASE_TABLET | Freq: Two times a day (BID) | ORAL | Status: DC
  Administered 2023-02-19: 40 mg via ORAL
  Filled 2023-02-19: qty 1

## 2023-02-19 MED ORDER — PANTOPRAZOLE SODIUM 40 MG PO TBEC: 40.0000 mg | DELAYED_RELEASE_TABLET | Freq: Two times a day (BID) | ORAL | 0 refills | Status: AC

## 2023-02-19 MED ORDER — ACETAMINOPHEN 325 MG PO TABS: 650.0000 mg | ORAL_TABLET | Freq: Four times a day (QID) | ORAL | Status: AC | PRN

## 2023-02-19 NOTE — Progress Notes (Signed)
Mobility Specialist - Progress Note   02/19/23 1113  Mobility  Activity Ambulated independently in hallway  Level of Assistance Independent  Assistive Device Other (Comment) (IV pole)  Distance Ambulated (ft) 200 ft  Activity Response Tolerated well  Mobility Referral Yes  $Mobility charge 1 Mobility   Pt was received in bed and agreeable to mobility. No complaints throughout session. Pt was returned to bed with all needs met.   Franki Monte  Mobility Specialist Please contact via Solicitor or Rehab office at [phone]

## 2023-02-19 NOTE — Discharge Summary (Signed)
Patient ID: Monica White [id number] 04-25-1941 82 y.o.  Admit date: 02/09/2023 Discharge date: 02/19/2023  Discharge Diagnosis Recurrent hiatal hernia s/p upper endo, laparoscopic reduction and G tube placement by Dr. Thermon Leyland 3/16   Consultants IR TCTS  Reason for Admission: Monica White is an 82 y.o. female who is here for chest pain, vomiting and hiatal hernia symptoms.   Ms. Males underwent laparoscopic paraesophageal hernai repair with Toupet fundoplication on 0000000 with Dr. Kieth Brightly.  She presented to the emergency room on 11/02/22 with a recurrent Type IV hiatal hernia including stomach, duodenum, transverse colon and omentum.  She returned to the operating room on 11/06/22, and underwent robotic reduction of hiatal hernai with laparoscopic G tube insertion.  Due to dense scar tissue from the recent hernia repair, the stomach was unable to be fully reduced, and the hiatus was unable to be formally repaired. Unfortunately, the G tube was dislodged on 11/12/22, when the patient got up to use the bathroom in the middle of the night.  Today, she presents to the ER with recurrent symptoms of chest pain, vomiting, similar to previous hiatal hernia symptoms.  Procedures Dr. Thermon Leyland - Upper GI endoscopy, laparoscopic gastrostomy tube placement - 02/09/23    Hospital Course:  Patient presented as above. She was taken to the OR by Dr. Thermon Leyland on 3/16 where she underwent upper GI endoscopy showing a massive hiatal hernia with massive dilation of the stomach and some ischemic changes noted on upper endoscopy. He proceeded with laparoscopic gastrostomy tube placement. 2 surgical drains were left during the time of the procedure. She was placed on PPI post op and G-tube to gravity. TCTS consulted, Dr. Kipp Brood, as patient was seeking consultation for definitive repair as outpatient already. Appreciate his input. On POD4, patient was started on TPN. UGI done. CT A/P ordered POD 6  based on results of UGI. This showed the gastrostomy tube tip is anterior to the stomach and external to the gastric lumen with the balloon of the distal catheter inflated within the anterior abdominal wall musculature. IR was consulted and was able to replace G-tube on POD 6. POD 7, Gtube was clamped and she was started on diet. Her diet was slowly advanced until she was tolerating soft diet with G-tube clamped. TPN weaned off. JP drains remained SS and were removed prior to discharge. On POD 10 the patient was tolerating diet with g-tube clamped, VSS, mobilizing, voiding, pain well controlled and felt stable for discharge home. She plans to follow up with thoracic surgery as outpatient to discuss definitive repair. She was arranged f/u in our office. TOC arranged HH. Discussed discharge instructions, restrictions and return/call back precautions.    Allergies as of 02/19/2023       Reactions   Atorvastatin Other (See Comments)   Stiffness   Escitalopram Other (See Comments)   Stiffness   Pitavastatin Other (See Comments)   Stiffness   Tramadol Other (See Comments)   "Felt bad and headache"        Medication List     STOP taking these medications    esomeprazole 20 MG packet Commonly known as: NexIUM       TAKE these medications    acetaminophen 325 MG tablet Commonly known as: TYLENOL Take 2 tablets (650 mg total) by mouth every 6 (six) hours as needed.   B-12 1000 MCG Caps Take 1,000 mcg by mouth daily.   cholecalciferol 25 MCG (1000 UNIT) tablet Commonly known as: VITAMIN D3 Take 1,000  Units by mouth daily.   estradiol 0.5 MG tablet Commonly known as: ESTRACE Take 0.5 mg by mouth daily.   levothyroxine 50 MCG tablet Commonly known as: SYNTHROID Take 50 mcg by mouth daily.   medroxyPROGESTERone 2.5 MG tablet Commonly known as: PROVERA Take 1 tablet (2.5 mg total) by mouth daily.   MULTIVITAMIN PO Take 1 tablet by mouth daily.   ondansetron 4 MG  disintegrating tablet Commonly known as: ZOFRAN-ODT Take 1 tablet (4 mg total) by mouth every 6 (six) hours as needed for nausea.   oxyCODONE 5 MG immediate release tablet Commonly known as: Oxy IR/ROXICODONE Take 1 tablet (5 mg total) by mouth every 6 (six) hours as needed for breakthrough pain.   pantoprazole 40 MG tablet Commonly known as: PROTONIX Take 1 tablet (40 mg total) by mouth 2 (two) times daily.   PROBIOTIC PO Take 1 capsule by mouth daily. Align   VITAMIN C PO Take 1 tablet by mouth daily.               Durable Medical Equipment  (From admission, onward)           Start     Ordered   02/18/23 1455  For home use only DME Walker rolling  Once       Question Answer Comment  Walker: With 5 Inch Wheels   Patient needs a walker to treat with the following condition Generalized weakness      02/18/23 1455              Follow-up Information     Care, [facility] Follow up.   Specialty: [place] Why: [facility] will call you to set up home health services for nursing within 24-48 hours of your discharge to home. Contact information: [place] [address] [phone]         Care, [facility] Follow up.   Why: [employer] will be providing a RW to your hospital room before you are discharged home. Contact information: [address] [place] [id number] [phone]         Crist Infante, MD Follow up.   Specialty: Internal Medicine Contact information: [address] [place] [address] [phone]         Lajuana Matte, MD Follow up.   Specialty: Cardiothoracic Surgery Contact information: [address] [place] [address] [phone]         Kinsinger, Arta Bruce, MD. Go on 03/08/2023.   Specialty: General Surgery Why: Your appointment is 4/12 at 10:10am Arrive 13min early to check in, fill out paperwork, Bring photo ID and insurance  information Contact information: [id number]  [place] [address] [phone]                 Signed: Alferd Apa, [facility] 02/19/2023, 11:30 AM Please see Amion for pager number during day hours 7:00am-4:30pm

## 2023-02-19 NOTE — TOC Transition Note (Signed)
Transition of Care [facility]) - CM/SW Discharge Note   Patient Details  Name: Monica White MRN: [id number] Date of Birth: Aug 24, 1941  [facility] CM/SW Contact:  Curlene Labrum, RN Phone Number: 02/19/2023, 11:25 AM   Clinical Narrative:    CM met with the patient at the bedside and the patient will likely discharge to home with the daughter this evening.  The patient is being weaned from her TPN and PICC line will be discontinued by IV Team per orders.  I spoke with the patient at the bedside and she is aware that [facility] will be providing home health services in [place] and [place] when she is discharged.  I asked that bedside nursing provide the patient with dressing supplies for home until the home health agency follows up with the patient.  [employer] was called and asked to deliver the RW to the bedside today before discharge to home.   Final next level of care: Home w Home Health Services Barriers to Discharge: Continued Medical Work up   Patient Goals and CMS Choice CMS Medicare.gov Compare Post Acute Care list provided to:: Patient Choice offered to / list presented to : Patient  Discharge Placement                         Discharge Plan and Services Additional resources added to the After Visit Summary for     Discharge Planning Services: CM Consult Post Acute Care Choice: Home Health                    HH Arranged: RN [facility] Agency: [place] Date [place]: 02/15/23 Time [place]: [id number] Representative spoke with at [place]: Will contact Tommi Rumps, [place] with Alvis Lemmings to follow for possible HH for RN  Social Determinants of Health (SDOH) Interventions SDOH Screenings   Food Insecurity: No Food Insecurity (11/05/2022)  Housing: Low Risk  (11/05/2022)  Transportation Needs: No Transportation Needs (11/05/2022)  Utilities: Not At Risk (11/05/2022)  Tobacco Use: Low Risk  (02/16/2023)     Readmission  Risk Interventions    02/15/2023   10:48 AM  Readmission Risk Prevention Plan  Transportation Screening Complete  PCP or Specialist Appt within 5-7 Days Complete  Home Care Screening Complete  Medication Review (RN CM) Complete

## 2023-02-19 NOTE — Discharge Instructions (Signed)
[facility]  Please arrive at least 30 min before your appointment to complete your check in paperwork.  If you are unable to arrive 30 min prior to your appointment time we may have to cancel or reschedule you. LAPAROSCOPIC SURGERY: POST OP INSTRUCTIONS Always review your discharge instruction sheet given to you by the facility where your surgery was performed. IF YOU HAVE DISABILITY OR FAMILY LEAVE FORMS, YOU MUST BRING THEM TO THE OFFICE FOR PROCESSING.   DO NOT GIVE THEM TO YOUR DOCTOR.  PAIN CONTROL  First take acetaminophen (Tylenol) to control your pain after surgery.  Follow directions on package.  Taking acetaminophen (Tylenol) regularly after surgery will help to control your pain and lower the amount of prescription pain medication you may need.  You should not take more than 4,000 mg (4 grams) of acetaminophen (Tylenol) in 24 hours.  You should not take ibuprofen (Advil), aleve, motrin, naprosyn or other NSAIDS if you have a history of stomach ulcers or chronic kidney disease.  A prescription for pain medication may be given to you upon discharge.  Take your pain medication as prescribed, if you still have uncontrolled pain after taking acetaminophen (Tylenol) Use ice packs to help control pain. If you need a refill on your pain medication, please contact your pharmacy.  They will contact our office to request authorization. Prescriptions will not be filled after 5pm or on week-ends.  HOME MEDICATIONS Take your usually prescribed medications unless otherwise directed.  DIET You should follow a light diet the first few days after arrival home.  Be sure to include lots of fluids daily. Avoid fatty, fried foods.   CONSTIPATION It is common to experience some constipation after surgery and if you are taking pain medication.  Increasing fluid intake and taking a stool softener (such as Colace) will usually help or prevent this problem from occurring.  A mild  laxative (Milk of Magnesia or Miralax) should be taken according to package instructions if there are no bowel movements after 48 hours.  WOUND/INCISION CARE Most patients will experience some swelling and bruising in the area of the incisions.  Ice packs will help.  Swelling and bruising can take several days to resolve.  Unless discharge instructions indicate otherwise, follow guidelines below  DERMABOND/SKIN GLUE - you may shower in 24 hours.  The glue will flake off over the next 2-3 weeks. Please keep your Gastrostomy tube clamped. Flush daily with 10cc of sterile water. If you develop any issues with your G-tube (new/worsening pain around the G-tube, leakage/draiange from or around the G-tube, skin irritation, redness around your G-tube or any other concerns) please call the office.   ACTIVITIES You may resume regular (light) daily activities beginning the next day--such as daily self-care, walking, climbing stairs--gradually increasing activities as tolerated.  You may have sexual intercourse when it is comfortable.  Refrain from any heavy lifting or straining until approved by your doctor. You may drive when you are no longer taking prescription pain medication, you can comfortably wear a seatbelt, and you can safely maneuver your car and apply brakes.  FOLLOW-UP You should see your doctor in the office for a follow-up appointment approximately 2-3 weeks after your surgery.  You should have been given your post-op/follow-up appointment when your surgery was scheduled.  If you did not receive a post-op/follow-up appointment, make sure that you call for this appointment within a day or two after you arrive home to insure a convenient appointment time.  WHEN TO CALL YOUR DOCTOR: Fever over 101.0 Inability to urinate Continued bleeding from incision. Increased pain, redness, or drainage from the incision. Increasing abdominal pain Any issues with your G-tube  The clinic staff is  available to answer your questions during regular business hours.  Please don't hesitate to call and ask to speak to one of the nurses for clinical concerns.  If you have a medical emergency, go to the nearest emergency room or call 911.  A surgeon from [facility] is always on call at the hospital. [address], [place], [place], [place]  [address] ? [address] [place], [place], [place]   [id number] [phone] ? [phone] ? FAX [phone]

## 2023-02-19 NOTE — Progress Notes (Addendum)
PHARMACY - TOTAL PARENTERAL NUTRITION CONSULT NOTE  Indication:  Prolonged NPO status d/t recurrent hiatal hernia   Patient Measurements: Height: 5\' 7"  (170.2 cm) Weight: 57 kg (125 lb 11.2 oz) IBW/kg (Calculated) : 61.6 TPN AdjBW (KG): 59 Body mass index is 19.69 kg/m. Usual Weight: 65 kg  Assessment:  Pt presenting to [facility] on 3/16 for chest pain, vomiting and hernia symptoms. Pt underwent laparoscopic paraesophageal hernia repair with Toupet fundoplication on 0000000. Presented to ED on 11/02/22 with recurrent large hiatal hernia and underwent robotic reduction of hernia with G tube insertion. It was unable to be fully repaired due to scar tissue from previous surgery. G tube was dislodged on 11/12/22. Pt failed multiple attempts to decompress stomach with NG tube and underwent upper endoscopy with decompression of gastric volvulus and g tube placement. She has had minimal flatus passage and has been without nutrition for about 4 days and expected prolonged NPO status.  Pharmacy consulted to dose TPN.  Pt was eating a full regular diet PTA on 3/16 and her last full meal was the night of the 15th.  She typically eats 3 meals per day with a light snack. She drinks lifelyte nutrition supplements at home and typically drinks 1 a day. She does not get a good mix of veggies at home but does eat a lot of yogurt. She has had multiple GI surgeries and has lost weight since then. Based on chart review and pt account she was about 145 lbs before initial hernia repair and 136 pounds after subsequent admission and hernia surgery.   Glucose / Insulin: no hx DM, A1c 5.5 in 2018, BG < 180, off SSI  Electrolytes: 3/25: All WNL, K 3.8 Renal: 3/25: Scr <1, BUN WNL Hepatic: 3/25: AST/ALT/Tbili/ TG WNL, albumin 2.4 Intake / Output; MIVF: UOP none documented, drains 44 ml, LBM 3/24, 591 mL PO intake GI Imaging:  12/7 CT: progressed large left diaphragmatic hernia, no evidence of bowel obstruction  2/3 CT: Large  hiatal hernia/intrathoracic stomach without evidence of gastric volvulus.  3/16 CT: . Large hiatal hernia with intrathoracic stomach. The stomach is markedly distended with air and debris, volvulus can not be completely excluded 3/21 UGI: substantial hiatal hernia, nonsepcific esophageal dysmotility disorder, no obstruction 3/22 CT: GT malpositioned,  large hiatal hernia with most of the stomach in the L hemithorax GI Surgeries / Procedures:   11/06/22 Reduction of the incarcerated hiatal hernia and lap G tube insertion  3/16 Upper endoscopy, lap reduction, and G tube placement  3/22 IR Gastrostomy tube replacement and repositioning   Central access: PICC placed 02/13/23 TPN start date: 02/13/23  Nutritional Goals: RD Estimated Needs Total Energy Estimated Needs: 1700-1900 Total Protein Estimated Needs: 85-100g Total Fluid Estimated Needs: >/=1.7L Goal TPN:  74mL/hr to provide providing 91g AA and 1774 kCal  Current Nutrition:  TPN Boost Breeze feeding supplement - 3 of 3 given yesterday Soft diet started 3/25 - meeting >60% of needs with oral diet and nutritional supplements  Plan:  Per PA, wean TPN today and discharge this PM Reduce TPN to 20 mL/hr at 1000, run at half rate for 2 hours and d/c at 1200 D/C TPN consult, nursing orders, TPN labs  Rodolph Bong, PharmD Candidate 02/19/2023 6:32 AM  Remigio Eisenmenger D. Mina Marble, PharmD, BCPS, [place] 02/19/2023, 9:28 AM

## 2023-02-19 NOTE — Progress Notes (Addendum)
[facility] Progress Note  10 Days Post-Op  Subjective: CC-  Doing well. Some pain near her G-tube at times but otherwise no abdominal pain. Not requiring PRN pain meds. Tolerating soft diet without n/v. Finished all her dinner last night. Drank 1 boost yesterday and 1 this am. Passing flatus. BM yesterday and today.   Afebrile. No tachycardia or hypotension.   Objective: Vital signs in last 24 hours: Temp:  [98 F (36.7 C)-98.2 F (36.8 C)] 98.1 F (36.7 C) (03/26 0756) Pulse Rate:  [85-90] 87 (03/26 0756) Resp:  [17-18] 18 (03/26 0756) BP: (105-145)/(61-76) 108/69 (03/26 0756) SpO2:  [93 %-98 %] 95 % (03/26 0756) Last BM Date : 02/15/23  Intake/Output from previous day: 03/25 0701 - 03/26 0700 In: 591 [P.O.:591] Out: 44 [Drains:44] Intake/Output this shift: No intake/output data recorded.  PE: Gen: Alert, NAD, pleasant GI: Soft, mild distension, nontender, G tube clamped. G-tube site cdi without obvious issue.  JP x2 SS (drain 1 - 4cc/24 hours, drain 2 - 40cc/24 hours)  Lab Results:  No results for input(s): "WBC", "HGB", "HCT", "PLT" in the last 72 hours. BMET Recent Labs    02/18/23 0506  NA 137  K 3.8  CL 102  CO2 27  GLUCOSE 112*  BUN 15  CREATININE 0.61  CALCIUM 8.7*    PT/INR No results for input(s): "LABPROT", "INR" in the last 72 hours. CMP     Component Value Date/Time   NA 137 02/18/2023 0506   NA 142 11/29/2021 1048   K 3.8 02/18/2023 0506   CL 102 02/18/2023 0506   CO2 27 02/18/2023 0506   GLUCOSE 112 (H) 02/18/2023 0506   BUN 15 02/18/2023 0506   BUN 21 11/29/2021 1048   CREATININE 0.61 02/18/2023 0506   CALCIUM 8.7 (L) 02/18/2023 0506   PROT 5.5 (L) 02/18/2023 0506   ALBUMIN 2.4 (L) 02/18/2023 0506   AST 20 02/18/2023 0506   ALT 17 02/18/2023 0506   ALKPHOS 80 02/18/2023 0506   BILITOT 0.3 02/18/2023 0506   GFRNONAA >60 02/18/2023 0506   GFRAA >60 02/17/2018 1123   Lipase  No results found for:  "LIPASE"     Studies/Results: No results found.  Anti-infectives: Anti-infectives (From admission, onward)    Start     Dose/Rate Route Frequency Ordered Stop   02/09/23 0719  metroNIDAZOLE (FLAGYL) 500 MG/100ML IVPB       Note to Pharmacy: Rejeana Brock L: cabinet override      02/09/23 0719 02/09/23 0758   02/09/23 0530  ceFEPIme (MAXIPIME) 2 g in sodium chloride 0.9 % 100 mL IVPB        2 g 200 mL/hr over 30 Minutes Intravenous  Once 02/09/23 0520 02/09/23 0820   02/09/23 0530  metroNIDAZOLE (FLAGYL) IVPB 500 mg        500 mg 100 mL/hr over 60 Minutes Intravenous  Once 02/09/23 0520 02/09/23 0736        Assessment/Plan Recurrent hiatal hernia POD#10 S/P upper endo, laparoscopic reduction and G tube placement by Dr. Thermon Leyland 3/16  - Intra-op found to have some ischemic gastric mucosa. WBC normalized 3/20, VSS - UGI 3/21 with no gross leak into abdominal cavity but raised concern for part of gastrostomy tube to be anterior to the gastric wall. CT scan 3/22 revealed gastrostomy tube balloon outside the stomach - S/p IR gastrostomy tube exchange 3/22 - Now tolerating soft diet and shakes with G-tube clamped. Having bowel function.  - Wean off TPN. Remove  PICC.  - JPs serous - monitor. Plan to remove prior to discharge.  - Will confirm with MD about G-tube flushing at d/c. Will have RN teach prior to discharge.  - Encouraged ambulation and IS use. PT recs no follow up - TOC has arranged HH. Patient plans to stay with her daughter at dc.  - TCTS consulted during admission. - appreciate their evaluation. Family reports they already have f/u thoracic surgery at [facility] and also plan to f/u with Dr. Kipp Brood as outpatient.  - Plan for d/c this pm after TPN weaned off, PICC line removed, JP drains removed and G-tube teaching is performed. Discussed discharge instructions, restrictions and return/call back precautions.  - I have updated the patient's son, [name], over  speaker phone while I was in the room per the patient's request  FEN - G tube clamped. Soft diet, Boost. Wean off TPN VTE - SCDs, Lovenox ID: cefepime/flagyl x 1 dose 3/16. None currently. Afebrile. Remove PICC once TPN weaned off.    Hypothyroidism  Hypercholesteremia GERD Arthritis   LOS: 10 days    Monica White, [facility] 02/19/2023, 9:10 AM Please see Amion for pager number during day hours 7:00am-4:30pm

## 2023-02-28 DIAGNOSIS — J9811 Atelectasis: Secondary | ICD-10-CM | POA: Diagnosis not present

## 2023-02-28 DIAGNOSIS — Z48815 Encounter for surgical aftercare following surgery on the digestive system: Secondary | ICD-10-CM | POA: Diagnosis not present

## 2023-02-28 DIAGNOSIS — K449 Diaphragmatic hernia without obstruction or gangrene: Secondary | ICD-10-CM | POA: Diagnosis not present

## 2023-02-28 DIAGNOSIS — E039 Hypothyroidism, unspecified: Secondary | ICD-10-CM | POA: Diagnosis not present

## 2023-02-28 DIAGNOSIS — M1990 Unspecified osteoarthritis, unspecified site: Secondary | ICD-10-CM | POA: Diagnosis not present

## 2023-02-28 DIAGNOSIS — Z9181 History of falling: Secondary | ICD-10-CM | POA: Diagnosis not present

## 2023-02-28 DIAGNOSIS — Z433 Encounter for attention to colostomy: Secondary | ICD-10-CM | POA: Diagnosis not present

## 2023-02-28 DIAGNOSIS — I700 Atherosclerosis of aorta: Secondary | ICD-10-CM | POA: Diagnosis not present

## 2023-02-28 DIAGNOSIS — D649 Anemia, unspecified: Secondary | ICD-10-CM | POA: Diagnosis not present

## 2023-02-28 DIAGNOSIS — E7800 Pure hypercholesterolemia, unspecified: Secondary | ICD-10-CM | POA: Diagnosis not present

## 2023-02-28 DIAGNOSIS — K219 Gastro-esophageal reflux disease without esophagitis: Secondary | ICD-10-CM | POA: Diagnosis not present

## 2023-03-01 DIAGNOSIS — M170 Bilateral primary osteoarthritis of knee: Secondary | ICD-10-CM | POA: Diagnosis not present

## 2023-03-01 DIAGNOSIS — M1711 Unilateral primary osteoarthritis, right knee: Secondary | ICD-10-CM | POA: Diagnosis not present

## 2023-03-01 DIAGNOSIS — M1712 Unilateral primary osteoarthritis, left knee: Secondary | ICD-10-CM | POA: Diagnosis not present

## 2023-03-04 DIAGNOSIS — D649 Anemia, unspecified: Secondary | ICD-10-CM | POA: Diagnosis not present

## 2023-03-04 DIAGNOSIS — I700 Atherosclerosis of aorta: Secondary | ICD-10-CM | POA: Diagnosis not present

## 2023-03-04 DIAGNOSIS — Z433 Encounter for attention to colostomy: Secondary | ICD-10-CM | POA: Diagnosis not present

## 2023-03-04 DIAGNOSIS — K449 Diaphragmatic hernia without obstruction or gangrene: Secondary | ICD-10-CM | POA: Diagnosis not present

## 2023-03-04 DIAGNOSIS — E7800 Pure hypercholesterolemia, unspecified: Secondary | ICD-10-CM | POA: Diagnosis not present

## 2023-03-04 DIAGNOSIS — Z48815 Encounter for surgical aftercare following surgery on the digestive system: Secondary | ICD-10-CM | POA: Diagnosis not present

## 2023-03-05 ENCOUNTER — Other Ambulatory Visit (HOSPITAL_COMMUNITY): Payer: Self-pay | Admitting: Interventional Radiology

## 2023-03-05 DIAGNOSIS — Z431 Encounter for attention to gastrostomy: Secondary | ICD-10-CM

## 2023-03-06 ENCOUNTER — Ambulatory Visit (HOSPITAL_COMMUNITY)
Admission: RE | Admit: 2023-03-06 | Discharge: 2023-03-06 | Disposition: A | Discharge status: Home / Self Care | Payer: Medicare Other | Source: Ambulatory Visit | Attending: Interventional Radiology | Admitting: Interventional Radiology

## 2023-03-06 DIAGNOSIS — K9423 Gastrostomy malfunction: Secondary | ICD-10-CM | POA: Insufficient documentation

## 2023-03-06 DIAGNOSIS — Z431 Encounter for attention to gastrostomy: Secondary | ICD-10-CM

## 2023-03-06 HISTORY — PX: IR REPLC GASTRO/COLONIC TUBE PERCUT W/FLUORO: IMG2333

## 2023-03-06 MED ORDER — LIDOCAINE VISCOUS HCL 2 % MT SOLN
OROMUCOSAL | Status: AC
  Filled 2023-03-06: qty 15

## 2023-03-06 MED ORDER — IOHEXOL 300 MG/ML  SOLN
50.0000 mL | Freq: Once | INTRAMUSCULAR | Status: AC | PRN
  Administered 2023-03-06: 20 mL

## 2023-03-06 NOTE — Procedures (Signed)
Interventional Radiology Procedure:   Indications: Pain and leaking at gastrostomy tube.  History of malpositioned tube.  Procedure: Successful exchange of G-tube with fluoroscopy  Findings: New 20 Fr tube placed.  Difficult to freely move the tube in the stomach and probably related to the hiatal hernia.  Confirmed placement in the stomach and inflated balloon with 12 ml of very dilute contrast.   Focal soft tissue swelling and redness just below the tube.  The tissue is edematous on [place].  No leakage with tube injection.   Complications: None     EBL: Minimal  Plan: Gastrostomy is ready to be used if needed. Patient is not currently using the tube for feeds.  If the soft tissue swelling and redness persists or increases then we should consider a CT of the abdomen with IV contrast for further characterization.    Eissa Buchberger R. Lowella Dandy, MD  Pager: [phone]

## 2023-03-07 DIAGNOSIS — L02211 Cutaneous abscess of abdominal wall: Secondary | ICD-10-CM | POA: Diagnosis not present

## 2023-03-08 ENCOUNTER — Encounter: Payer: Self-pay | Admitting: Thoracic Surgery (Cardiothoracic Vascular Surgery)

## 2023-03-08 ENCOUNTER — Other Ambulatory Visit: Payer: Self-pay | Admitting: Physician Assistant

## 2023-03-08 ENCOUNTER — Ambulatory Visit (INDEPENDENT_AMBULATORY_CARE_PROVIDER_SITE_OTHER): Payer: Medicare Other | Admitting: Thoracic Surgery (Cardiothoracic Vascular Surgery)

## 2023-03-08 VITALS — BP 117/71 | HR 99 | Resp 20 | Ht 67.5 in | Wt 122.0 lb

## 2023-03-08 DIAGNOSIS — L02219 Cutaneous abscess of trunk, unspecified: Secondary | ICD-10-CM

## 2023-03-08 DIAGNOSIS — Z01818 Encounter for other preprocedural examination: Secondary | ICD-10-CM | POA: Diagnosis not present

## 2023-03-08 DIAGNOSIS — K449 Diaphragmatic hernia without obstruction or gangrene: Secondary | ICD-10-CM | POA: Diagnosis not present

## 2023-03-10 NOTE — Progress Notes (Signed)
      [address]       [place] [address]             [phone]        Trea Chrisp [facility] Medical Record #[id number] Date of Birth: 09/06/1941  Referring: Kinsinger, De Blanch, * Primary Care: Rodrigo Ran, MD Primary Cardiologist:Paula Tenny Craw, MD  Reason for visit:   follow-up  History of Present Illness:     Monica White presents for her hospital follow-up.  She has had multiple issues with her G-tube, and currently has some redness and swelling under the site.  At her last general surgery appointment, this area was aspirated and she was placed on antibiotics.  She currently is tolerating a regular diet  Physical Exam: BP 117/71 (BP Location: Left Arm, Patient Position: Sitting, Cuff Size: Normal)   Pulse 99   Resp 20   Ht 5' 7.5" (1.715 m)   Wt 122 lb (55.3 kg)   LMP 01/13/1994   SpO2 95% Comment: RA  BMI 18.83 kg/m   Alert NAD Abdomen with area of erythema and fluctuance inferior to the G-tube site. no peripheral edema       Assessment / Plan:   82yo female s/p PEH repair by general surgery in 4/23, with recurrence and aborted repair, and placement of G tube in 12/23, who subsequently represented 3/24 with gastric volvulus.  I will need to discuss this case further with general surgery in regards to timing her definitive repair.  We discussed th possibility of mobilizing the hernia in chest robotically, and then repair the hiatus laparoscopically.  She is scheduled to see general surgery later today to address wound care issues around her G-tube   Monica White 03/10/2023 4:17 PM

## 2023-03-11 ENCOUNTER — Ambulatory Visit
Admission: RE | Admit: 2023-03-11 | Discharge: 2023-03-11 | Disposition: A | Discharge status: Home / Self Care | Payer: Medicare Other | Source: Ambulatory Visit | Attending: Physician Assistant | Admitting: Physician Assistant

## 2023-03-11 ENCOUNTER — Telehealth: Payer: Medicare Other | Admitting: Thoracic Surgery (Cardiothoracic Vascular Surgery)

## 2023-03-11 DIAGNOSIS — N281 Cyst of kidney, acquired: Secondary | ICD-10-CM | POA: Diagnosis not present

## 2023-03-11 DIAGNOSIS — K5730 Diverticulosis of large intestine without perforation or abscess without bleeding: Secondary | ICD-10-CM | POA: Diagnosis not present

## 2023-03-11 DIAGNOSIS — L02219 Cutaneous abscess of trunk, unspecified: Secondary | ICD-10-CM

## 2023-03-11 DIAGNOSIS — D259 Leiomyoma of uterus, unspecified: Secondary | ICD-10-CM | POA: Diagnosis not present

## 2023-03-11 DIAGNOSIS — K449 Diaphragmatic hernia without obstruction or gangrene: Secondary | ICD-10-CM | POA: Diagnosis not present

## 2023-03-11 MED ORDER — IOPAMIDOL (ISOVUE-300) INJECTION 61%
100.0000 mL | Freq: Once | INTRAVENOUS | Status: AC | PRN
  Administered 2023-03-11: 100 mL via INTRAVENOUS

## 2023-03-14 DIAGNOSIS — Z433 Encounter for attention to colostomy: Secondary | ICD-10-CM | POA: Diagnosis not present

## 2023-03-14 DIAGNOSIS — D649 Anemia, unspecified: Secondary | ICD-10-CM | POA: Diagnosis not present

## 2023-03-14 DIAGNOSIS — Z48815 Encounter for surgical aftercare following surgery on the digestive system: Secondary | ICD-10-CM | POA: Diagnosis not present

## 2023-03-14 DIAGNOSIS — I700 Atherosclerosis of aorta: Secondary | ICD-10-CM | POA: Diagnosis not present

## 2023-03-14 DIAGNOSIS — K449 Diaphragmatic hernia without obstruction or gangrene: Secondary | ICD-10-CM | POA: Diagnosis not present

## 2023-03-14 DIAGNOSIS — E7800 Pure hypercholesterolemia, unspecified: Secondary | ICD-10-CM | POA: Diagnosis not present

## 2023-03-19 DIAGNOSIS — I700 Atherosclerosis of aorta: Secondary | ICD-10-CM | POA: Diagnosis not present

## 2023-03-19 DIAGNOSIS — D649 Anemia, unspecified: Secondary | ICD-10-CM | POA: Diagnosis not present

## 2023-03-19 DIAGNOSIS — K449 Diaphragmatic hernia without obstruction or gangrene: Secondary | ICD-10-CM | POA: Diagnosis not present

## 2023-03-19 DIAGNOSIS — E7800 Pure hypercholesterolemia, unspecified: Secondary | ICD-10-CM | POA: Diagnosis not present

## 2023-03-19 DIAGNOSIS — Z433 Encounter for attention to colostomy: Secondary | ICD-10-CM | POA: Diagnosis not present

## 2023-03-19 DIAGNOSIS — Z48815 Encounter for surgical aftercare following surgery on the digestive system: Secondary | ICD-10-CM | POA: Diagnosis not present

## 2023-03-22 DIAGNOSIS — R413 Other amnesia: Secondary | ICD-10-CM | POA: Diagnosis not present

## 2023-03-22 DIAGNOSIS — I700 Atherosclerosis of aorta: Secondary | ICD-10-CM | POA: Diagnosis not present

## 2023-03-22 DIAGNOSIS — E039 Hypothyroidism, unspecified: Secondary | ICD-10-CM | POA: Diagnosis not present

## 2023-03-22 DIAGNOSIS — Z931 Gastrostomy status: Secondary | ICD-10-CM | POA: Diagnosis not present

## 2023-03-22 DIAGNOSIS — I509 Heart failure, unspecified: Secondary | ICD-10-CM | POA: Diagnosis not present

## 2023-03-22 DIAGNOSIS — K449 Diaphragmatic hernia without obstruction or gangrene: Secondary | ICD-10-CM | POA: Diagnosis not present

## 2023-03-22 DIAGNOSIS — F329 Major depressive disorder, single episode, unspecified: Secondary | ICD-10-CM | POA: Diagnosis not present

## 2023-03-22 DIAGNOSIS — E785 Hyperlipidemia, unspecified: Secondary | ICD-10-CM | POA: Diagnosis not present

## 2023-03-22 DIAGNOSIS — I712 Thoracic aortic aneurysm, without rupture, unspecified: Secondary | ICD-10-CM | POA: Diagnosis not present

## 2023-03-22 DIAGNOSIS — I2510 Atherosclerotic heart disease of native coronary artery without angina pectoris: Secondary | ICD-10-CM | POA: Diagnosis not present

## 2023-03-22 DIAGNOSIS — S31109A Unspecified open wound of abdominal wall, unspecified quadrant without penetration into peritoneal cavity, initial encounter: Secondary | ICD-10-CM | POA: Diagnosis not present

## 2023-03-22 DIAGNOSIS — R300 Dysuria: Secondary | ICD-10-CM | POA: Diagnosis not present

## 2023-03-26 DIAGNOSIS — Z433 Encounter for attention to colostomy: Secondary | ICD-10-CM | POA: Diagnosis not present

## 2023-03-26 DIAGNOSIS — Z48815 Encounter for surgical aftercare following surgery on the digestive system: Secondary | ICD-10-CM | POA: Diagnosis not present

## 2023-03-26 DIAGNOSIS — K449 Diaphragmatic hernia without obstruction or gangrene: Secondary | ICD-10-CM | POA: Diagnosis not present

## 2023-03-26 DIAGNOSIS — D649 Anemia, unspecified: Secondary | ICD-10-CM | POA: Diagnosis not present

## 2023-03-26 DIAGNOSIS — E7800 Pure hypercholesterolemia, unspecified: Secondary | ICD-10-CM | POA: Diagnosis not present

## 2023-03-26 DIAGNOSIS — I700 Atherosclerosis of aorta: Secondary | ICD-10-CM | POA: Diagnosis not present

## 2023-03-30 DIAGNOSIS — M1990 Unspecified osteoarthritis, unspecified site: Secondary | ICD-10-CM | POA: Diagnosis not present

## 2023-03-30 DIAGNOSIS — E039 Hypothyroidism, unspecified: Secondary | ICD-10-CM | POA: Diagnosis not present

## 2023-03-30 DIAGNOSIS — I700 Atherosclerosis of aorta: Secondary | ICD-10-CM | POA: Diagnosis not present

## 2023-03-30 DIAGNOSIS — K449 Diaphragmatic hernia without obstruction or gangrene: Secondary | ICD-10-CM | POA: Diagnosis not present

## 2023-03-30 DIAGNOSIS — Z433 Encounter for attention to colostomy: Secondary | ICD-10-CM | POA: Diagnosis not present

## 2023-03-30 DIAGNOSIS — Z48815 Encounter for surgical aftercare following surgery on the digestive system: Secondary | ICD-10-CM | POA: Diagnosis not present

## 2023-03-30 DIAGNOSIS — J9811 Atelectasis: Secondary | ICD-10-CM | POA: Diagnosis not present

## 2023-03-30 DIAGNOSIS — K219 Gastro-esophageal reflux disease without esophagitis: Secondary | ICD-10-CM | POA: Diagnosis not present

## 2023-03-30 DIAGNOSIS — Z9181 History of falling: Secondary | ICD-10-CM | POA: Diagnosis not present

## 2023-03-30 DIAGNOSIS — E7800 Pure hypercholesterolemia, unspecified: Secondary | ICD-10-CM | POA: Diagnosis not present

## 2023-03-30 DIAGNOSIS — D649 Anemia, unspecified: Secondary | ICD-10-CM | POA: Diagnosis not present

## 2023-04-03 DIAGNOSIS — K449 Diaphragmatic hernia without obstruction or gangrene: Secondary | ICD-10-CM | POA: Diagnosis not present

## 2023-04-03 DIAGNOSIS — Z48815 Encounter for surgical aftercare following surgery on the digestive system: Secondary | ICD-10-CM | POA: Diagnosis not present

## 2023-04-03 DIAGNOSIS — E7800 Pure hypercholesterolemia, unspecified: Secondary | ICD-10-CM | POA: Diagnosis not present

## 2023-04-03 DIAGNOSIS — D649 Anemia, unspecified: Secondary | ICD-10-CM | POA: Diagnosis not present

## 2023-04-03 DIAGNOSIS — Z433 Encounter for attention to colostomy: Secondary | ICD-10-CM | POA: Diagnosis not present

## 2023-04-03 DIAGNOSIS — I700 Atherosclerosis of aorta: Secondary | ICD-10-CM | POA: Diagnosis not present

## 2023-04-05 DIAGNOSIS — K449 Diaphragmatic hernia without obstruction or gangrene: Secondary | ICD-10-CM | POA: Diagnosis not present

## 2023-04-05 DIAGNOSIS — E7800 Pure hypercholesterolemia, unspecified: Secondary | ICD-10-CM | POA: Diagnosis not present

## 2023-04-05 DIAGNOSIS — D649 Anemia, unspecified: Secondary | ICD-10-CM | POA: Diagnosis not present

## 2023-04-05 DIAGNOSIS — Z48815 Encounter for surgical aftercare following surgery on the digestive system: Secondary | ICD-10-CM | POA: Diagnosis not present

## 2023-04-05 DIAGNOSIS — I700 Atherosclerosis of aorta: Secondary | ICD-10-CM | POA: Diagnosis not present

## 2023-04-05 DIAGNOSIS — Z433 Encounter for attention to colostomy: Secondary | ICD-10-CM | POA: Diagnosis not present

## 2023-04-08 DIAGNOSIS — D649 Anemia, unspecified: Secondary | ICD-10-CM | POA: Diagnosis not present

## 2023-04-08 DIAGNOSIS — Z48815 Encounter for surgical aftercare following surgery on the digestive system: Secondary | ICD-10-CM | POA: Diagnosis not present

## 2023-04-08 DIAGNOSIS — E7800 Pure hypercholesterolemia, unspecified: Secondary | ICD-10-CM | POA: Diagnosis not present

## 2023-04-08 DIAGNOSIS — K449 Diaphragmatic hernia without obstruction or gangrene: Secondary | ICD-10-CM | POA: Diagnosis not present

## 2023-04-08 DIAGNOSIS — Z433 Encounter for attention to colostomy: Secondary | ICD-10-CM | POA: Diagnosis not present

## 2023-04-08 DIAGNOSIS — I700 Atherosclerosis of aorta: Secondary | ICD-10-CM | POA: Diagnosis not present

## 2023-04-11 DIAGNOSIS — Z48815 Encounter for surgical aftercare following surgery on the digestive system: Secondary | ICD-10-CM | POA: Diagnosis not present

## 2023-04-11 DIAGNOSIS — D649 Anemia, unspecified: Secondary | ICD-10-CM | POA: Diagnosis not present

## 2023-04-11 DIAGNOSIS — I700 Atherosclerosis of aorta: Secondary | ICD-10-CM | POA: Diagnosis not present

## 2023-04-11 DIAGNOSIS — K449 Diaphragmatic hernia without obstruction or gangrene: Secondary | ICD-10-CM | POA: Diagnosis not present

## 2023-04-11 DIAGNOSIS — E7800 Pure hypercholesterolemia, unspecified: Secondary | ICD-10-CM | POA: Diagnosis not present

## 2023-04-11 DIAGNOSIS — Z433 Encounter for attention to colostomy: Secondary | ICD-10-CM | POA: Diagnosis not present

## 2023-04-18 DIAGNOSIS — K449 Diaphragmatic hernia without obstruction or gangrene: Secondary | ICD-10-CM | POA: Diagnosis not present

## 2023-04-18 DIAGNOSIS — D649 Anemia, unspecified: Secondary | ICD-10-CM | POA: Diagnosis not present

## 2023-04-18 DIAGNOSIS — E7800 Pure hypercholesterolemia, unspecified: Secondary | ICD-10-CM | POA: Diagnosis not present

## 2023-04-18 DIAGNOSIS — Z48815 Encounter for surgical aftercare following surgery on the digestive system: Secondary | ICD-10-CM | POA: Diagnosis not present

## 2023-04-18 DIAGNOSIS — Z433 Encounter for attention to colostomy: Secondary | ICD-10-CM | POA: Diagnosis not present

## 2023-04-18 DIAGNOSIS — I700 Atherosclerosis of aorta: Secondary | ICD-10-CM | POA: Diagnosis not present

## 2023-04-24 DIAGNOSIS — E7800 Pure hypercholesterolemia, unspecified: Secondary | ICD-10-CM | POA: Diagnosis not present

## 2023-04-24 DIAGNOSIS — I700 Atherosclerosis of aorta: Secondary | ICD-10-CM | POA: Diagnosis not present

## 2023-04-24 DIAGNOSIS — Z433 Encounter for attention to colostomy: Secondary | ICD-10-CM | POA: Diagnosis not present

## 2023-04-24 DIAGNOSIS — D649 Anemia, unspecified: Secondary | ICD-10-CM | POA: Diagnosis not present

## 2023-04-24 DIAGNOSIS — Z48815 Encounter for surgical aftercare following surgery on the digestive system: Secondary | ICD-10-CM | POA: Diagnosis not present

## 2023-04-24 DIAGNOSIS — K449 Diaphragmatic hernia without obstruction or gangrene: Secondary | ICD-10-CM | POA: Diagnosis not present

## 2023-04-28 DIAGNOSIS — Z433 Encounter for attention to colostomy: Secondary | ICD-10-CM | POA: Diagnosis not present

## 2023-04-28 DIAGNOSIS — Z48815 Encounter for surgical aftercare following surgery on the digestive system: Secondary | ICD-10-CM | POA: Diagnosis not present

## 2023-04-28 DIAGNOSIS — D649 Anemia, unspecified: Secondary | ICD-10-CM | POA: Diagnosis not present

## 2023-04-28 DIAGNOSIS — K449 Diaphragmatic hernia without obstruction or gangrene: Secondary | ICD-10-CM | POA: Diagnosis not present

## 2023-04-28 DIAGNOSIS — E7800 Pure hypercholesterolemia, unspecified: Secondary | ICD-10-CM | POA: Diagnosis not present

## 2023-04-28 DIAGNOSIS — I700 Atherosclerosis of aorta: Secondary | ICD-10-CM | POA: Diagnosis not present

## 2023-06-05 DIAGNOSIS — H5203 Hypermetropia, bilateral: Secondary | ICD-10-CM | POA: Diagnosis not present

## 2023-06-05 DIAGNOSIS — H353132 Nonexudative age-related macular degeneration, bilateral, intermediate dry stage: Secondary | ICD-10-CM | POA: Diagnosis not present

## 2023-06-05 DIAGNOSIS — H2513 Age-related nuclear cataract, bilateral: Secondary | ICD-10-CM | POA: Diagnosis not present

## 2023-06-05 DIAGNOSIS — H349 Unspecified retinal vascular occlusion: Secondary | ICD-10-CM | POA: Diagnosis not present

## 2023-06-11 ENCOUNTER — Other Ambulatory Visit (HOSPITAL_COMMUNITY): Payer: Self-pay

## 2023-06-13 ENCOUNTER — Encounter (HOSPITAL_COMMUNITY)
Admission: RE | Admit: 2023-06-13 | Discharge: 2023-06-13 | Disposition: A | Discharge status: Home / Self Care | Payer: Medicare Other | Source: Ambulatory Visit | Attending: Internal Medicine | Admitting: Internal Medicine

## 2023-06-13 DIAGNOSIS — M810 Age-related osteoporosis without current pathological fracture: Secondary | ICD-10-CM | POA: Diagnosis not present

## 2023-06-13 DIAGNOSIS — Z79620 Long term (current) use of immunosuppressive biologic: Secondary | ICD-10-CM | POA: Diagnosis not present

## 2023-06-13 MED ORDER — DENOSUMAB 60 MG/ML ~~LOC~~ SOSY
PREFILLED_SYRINGE | SUBCUTANEOUS | Status: AC
  Filled 2023-06-13: qty 1

## 2023-06-13 MED ORDER — INCLISIRAN SODIUM 284 MG/1.5ML ~~LOC~~ SOSY
284.0000 mg | PREFILLED_SYRINGE | Freq: Once | SUBCUTANEOUS | Status: AC
  Administered 2023-06-13: 284 mg via SUBCUTANEOUS

## 2023-06-13 MED ORDER — DENOSUMAB 60 MG/ML ~~LOC~~ SOSY
60.0000 mg | PREFILLED_SYRINGE | Freq: Once | SUBCUTANEOUS | Status: AC
  Administered 2023-06-13: 60 mg via SUBCUTANEOUS

## 2023-06-13 MED ORDER — INCLISIRAN SODIUM 284 MG/1.5ML ~~LOC~~ SOSY
PREFILLED_SYRINGE | SUBCUTANEOUS | Status: AC
  Filled 2023-06-13: qty 1.5

## 2023-06-16 IMAGING — CT CT CHEST HIGH RESOLUTION
1 of 3 series · 14 of 32 positions shown, 18 images · non-contrast
Comparison: Chest CT 10/09/2018.

CLINICAL DATA: 80-year-old female with history of dyspnea on
exertion over the past 2 years.

EXAM:
CT CHEST WITHOUT CONTRAST
TECHNIQUE: Multidetector CT imaging of the chest was performed following the
standard protocol without intravenous contrast. High resolution
imaging of the lungs, as well as inspiratory and expiratory imaging,
was performed.

[Series 2: chest · axial · 0.74mm/px · z∈[+279,+589]mm · 14 of 171 slices shown, 18 images]
[im 8/171  mediastinal]
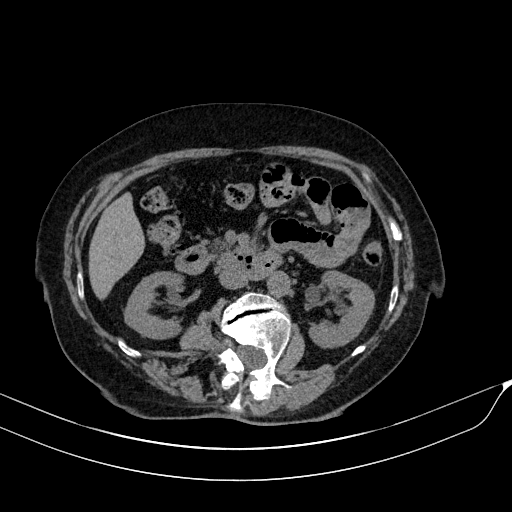
[im 8/171  lung]
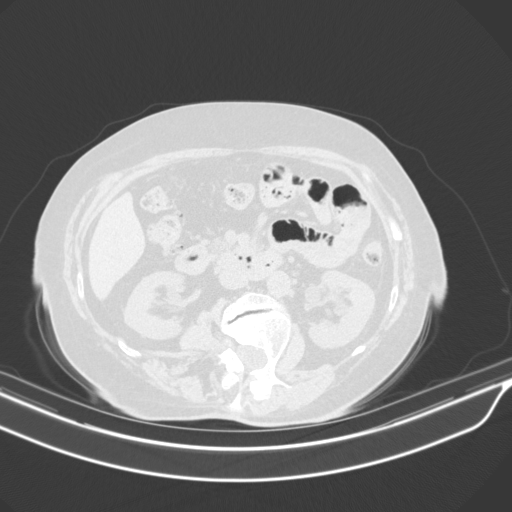
[im 24/171  lung]
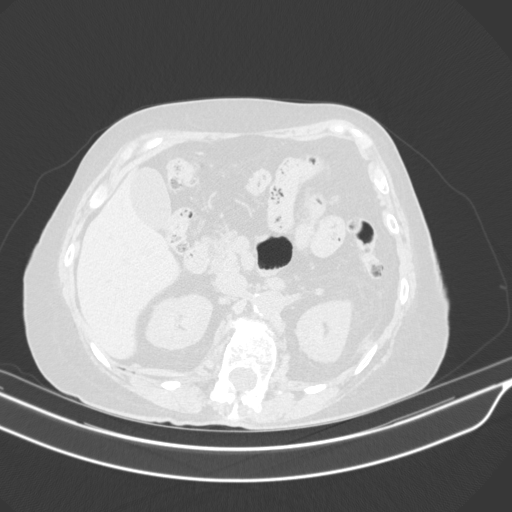
[im 39/171  lung]
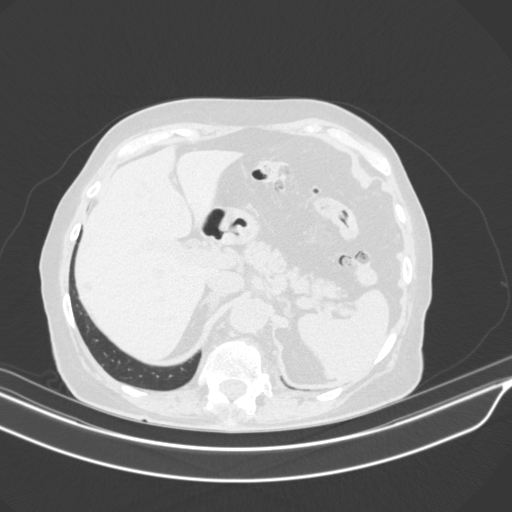
[im 55/171  lung]
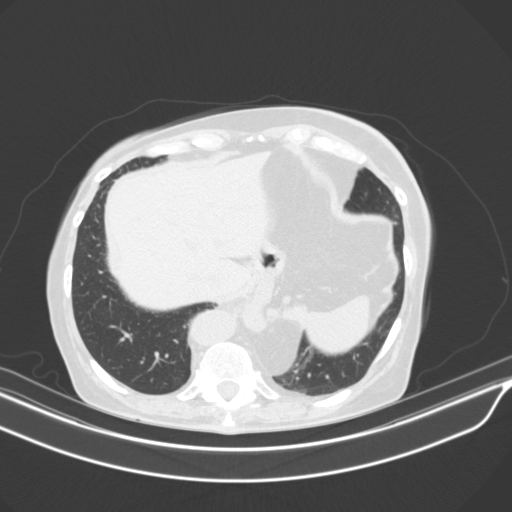
[im 57/171  mediastinal]
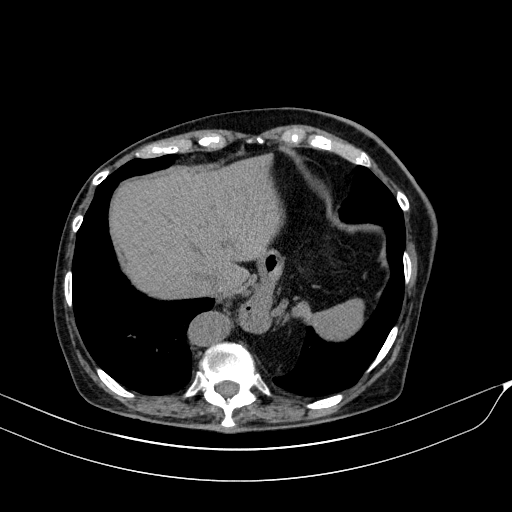
[im 57/171  lung]
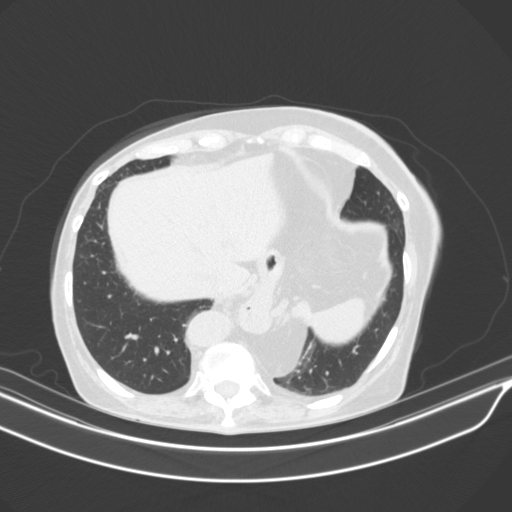
[im 70/171  lung]
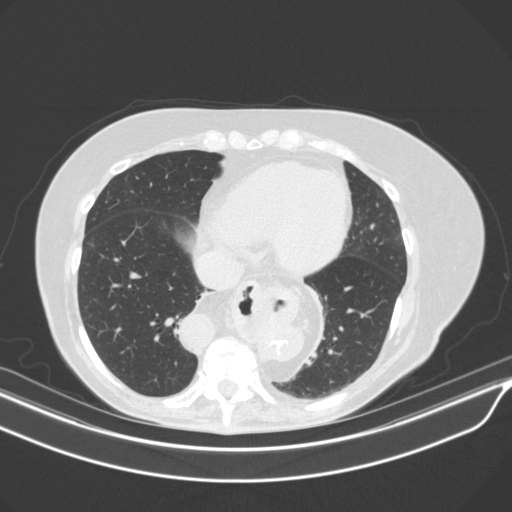
[im 81/171  lung]
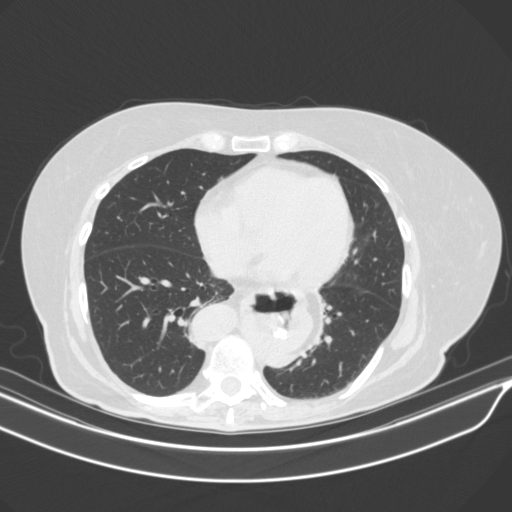
[im 86/171  lung]
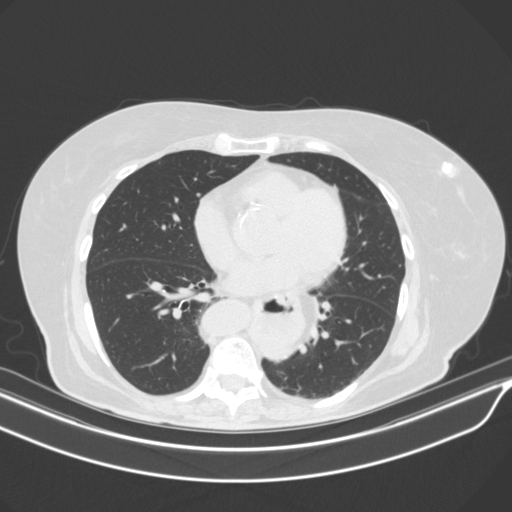
[im 101/171  mediastinal]
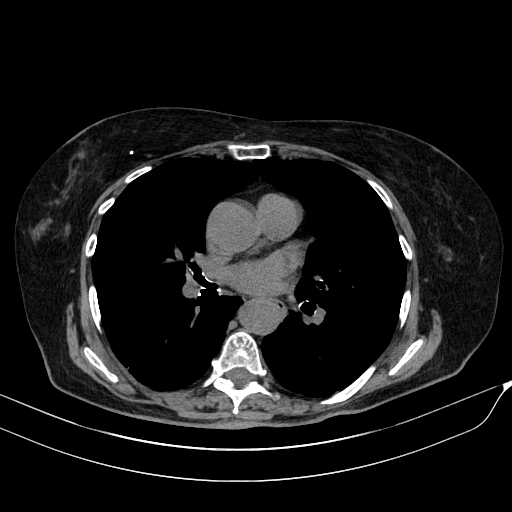
[im 101/171  lung]
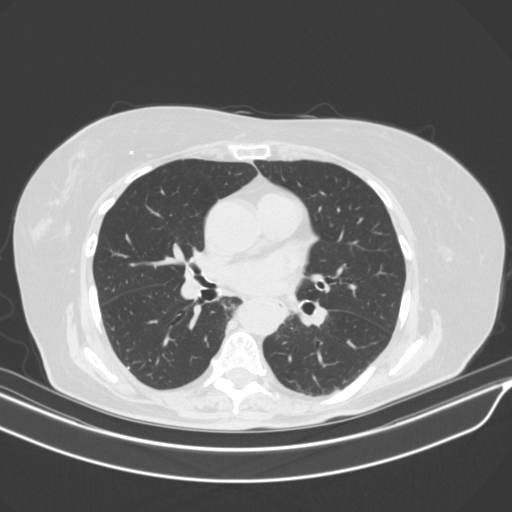
[im 114/171  lung]
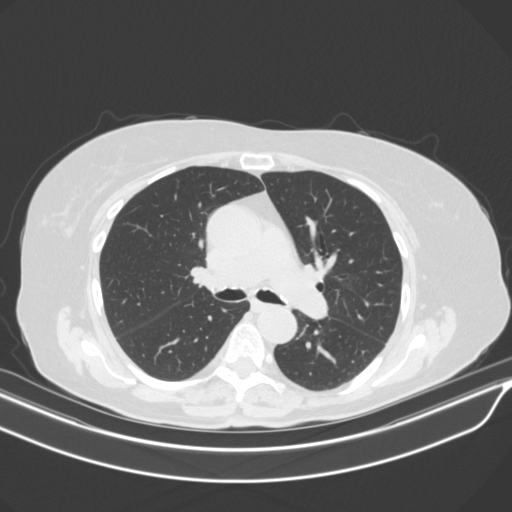
[im 124/171  lung]
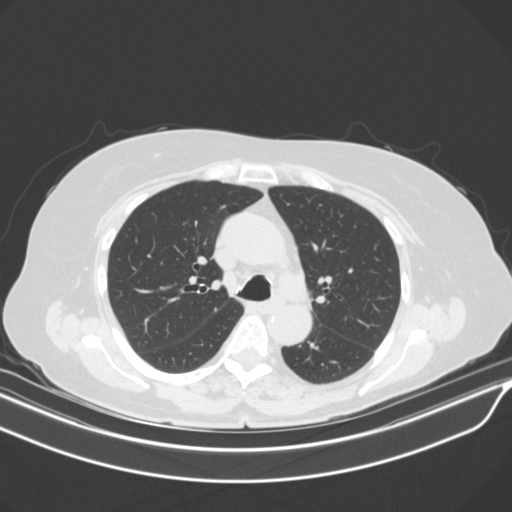
[im 132/171  lung]
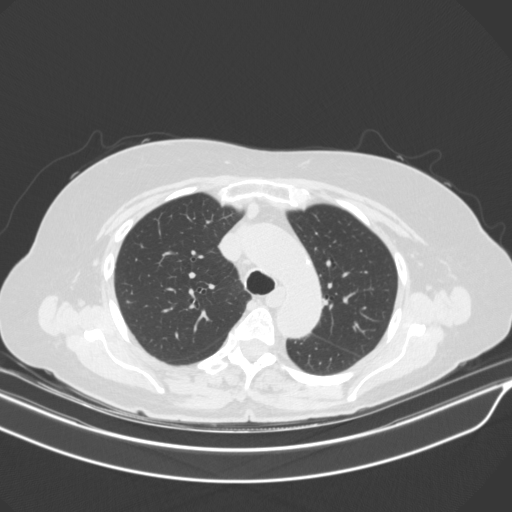
[im 147/171  mediastinal]
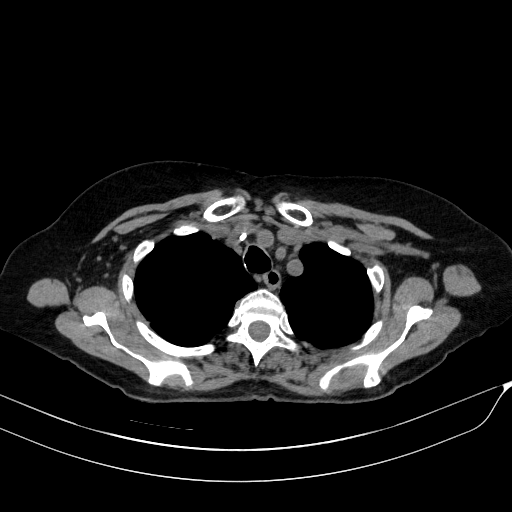
[im 147/171  lung]
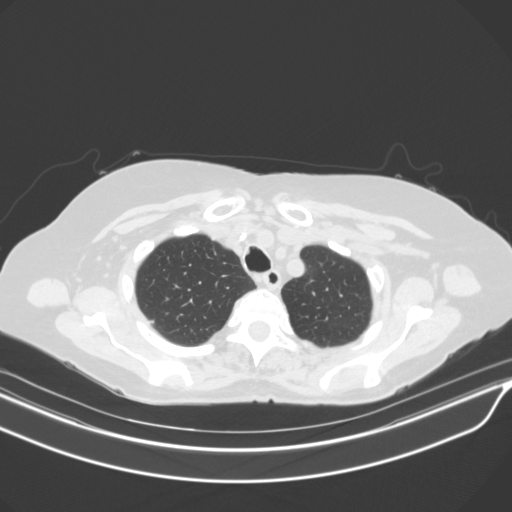
[im 163/171  lung]
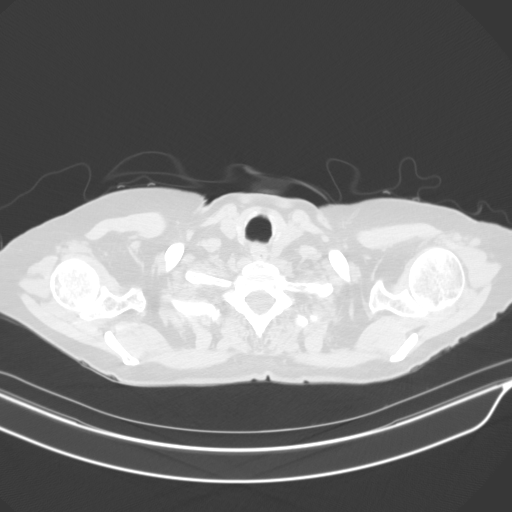

[14 of 32 positions shown; findings below may reference images not displayed]

FINDINGS: Cardiovascular: Heart size is normal. There is no significant
pericardial fluid, thickening or pericardial calcification. There is
aortic atherosclerosis, as well as atherosclerosis of the great
vessels of the mediastinum and the coronary arteries, including
calcified atherosclerotic plaque in the left anterior descending,
left circumflex and right coronary arteries.

Mediastinum/Nodes: No pathologically enlarged mediastinal or hilar
lymph nodes. Large hiatal hernia. No axillary lymphadenopathy.

Lungs/Pleura: High-resolution images demonstrate no significant
regions of ground-glass attenuation, septal thickening, subpleural
reticulation, parenchymal banding, traction bronchiectasis or frank
honeycombing to indicate interstitial lung disease. Inspiratory and
expiratory imaging is unremarkable. No acute consolidative airspace
disease. No pleural effusions. A few scattered 2-4 mm pulmonary
nodules are noted throughout the lungs bilaterally.

Upper Abdomen: Low-attenuation lesions in the left kidney,
incompletely characterized on today's non-contrast CT examination,
but statistically likely to represent large peripelvic cysts.
Low-attenuation lesions in the liver, incompletely characterized on
today's non-contrast CT examination, but also likely to represent
cysts. Aortic atherosclerosis.

Musculoskeletal: There are no aggressive appearing lytic or blastic
lesions noted in the visualized portions of the skeleton.
IMPRESSION: 1. No findings to suggest interstitial lung disease.
2. Small 2-4 mm pulmonary nodules scattered throughout the lungs
bilaterally, nonspecific, but statistically likely benign. No
follow-up needed if patient is low-risk (and has no known or
suspected primary neoplasm). Non-contrast chest CT can be considered
in 12 months if patient is high-risk. This recommendation follows
the consensus statement: Guidelines for Management of Incidental
Pulmonary Nodules Detected on CT Images: From the [HOSPITAL]
3. Aortic atherosclerosis, in addition to three-vessel coronary
artery disease. Please note that although the presence of coronary
artery calcium documents the presence of coronary artery disease,
the severity of this disease and any potential stenosis cannot be
assessed on this non-gated CT examination. Assessment for potential
risk factor modification, dietary therapy or pharmacologic therapy
may be warranted, if clinically indicated.
4. Large hiatal hernia.

Aortic Atherosclerosis (MP34E-3H8.8).

## 2023-06-18 DIAGNOSIS — M170 Bilateral primary osteoarthritis of knee: Secondary | ICD-10-CM | POA: Diagnosis not present

## 2023-06-18 DIAGNOSIS — M1711 Unilateral primary osteoarthritis, right knee: Secondary | ICD-10-CM | POA: Diagnosis not present

## 2023-06-18 DIAGNOSIS — M1712 Unilateral primary osteoarthritis, left knee: Secondary | ICD-10-CM | POA: Diagnosis not present

## 2023-06-26 DIAGNOSIS — M170 Bilateral primary osteoarthritis of knee: Secondary | ICD-10-CM | POA: Diagnosis not present

## 2023-06-26 DIAGNOSIS — R531 Weakness: Secondary | ICD-10-CM | POA: Diagnosis not present

## 2023-06-28 DIAGNOSIS — M170 Bilateral primary osteoarthritis of knee: Secondary | ICD-10-CM | POA: Diagnosis not present

## 2023-06-28 DIAGNOSIS — R531 Weakness: Secondary | ICD-10-CM | POA: Diagnosis not present

## 2023-07-03 DIAGNOSIS — R531 Weakness: Secondary | ICD-10-CM | POA: Diagnosis not present

## 2023-07-03 DIAGNOSIS — M170 Bilateral primary osteoarthritis of knee: Secondary | ICD-10-CM | POA: Diagnosis not present

## 2023-07-05 DIAGNOSIS — R531 Weakness: Secondary | ICD-10-CM | POA: Diagnosis not present

## 2023-07-05 DIAGNOSIS — M170 Bilateral primary osteoarthritis of knee: Secondary | ICD-10-CM | POA: Diagnosis not present

## 2023-07-10 DIAGNOSIS — M170 Bilateral primary osteoarthritis of knee: Secondary | ICD-10-CM | POA: Diagnosis not present

## 2023-07-10 DIAGNOSIS — R531 Weakness: Secondary | ICD-10-CM | POA: Diagnosis not present

## 2023-07-12 DIAGNOSIS — R531 Weakness: Secondary | ICD-10-CM | POA: Diagnosis not present

## 2023-07-12 DIAGNOSIS — M170 Bilateral primary osteoarthritis of knee: Secondary | ICD-10-CM | POA: Diagnosis not present

## 2023-07-17 DIAGNOSIS — R531 Weakness: Secondary | ICD-10-CM | POA: Diagnosis not present

## 2023-07-17 DIAGNOSIS — M170 Bilateral primary osteoarthritis of knee: Secondary | ICD-10-CM | POA: Diagnosis not present

## 2023-07-19 DIAGNOSIS — M170 Bilateral primary osteoarthritis of knee: Secondary | ICD-10-CM | POA: Diagnosis not present

## 2023-07-19 DIAGNOSIS — R531 Weakness: Secondary | ICD-10-CM | POA: Diagnosis not present

## 2023-07-31 DIAGNOSIS — R531 Weakness: Secondary | ICD-10-CM | POA: Diagnosis not present

## 2023-07-31 DIAGNOSIS — M170 Bilateral primary osteoarthritis of knee: Secondary | ICD-10-CM | POA: Diagnosis not present

## 2023-08-02 DIAGNOSIS — R531 Weakness: Secondary | ICD-10-CM | POA: Diagnosis not present

## 2023-08-02 DIAGNOSIS — M170 Bilateral primary osteoarthritis of knee: Secondary | ICD-10-CM | POA: Diagnosis not present

## 2023-08-07 DIAGNOSIS — M170 Bilateral primary osteoarthritis of knee: Secondary | ICD-10-CM | POA: Diagnosis not present

## 2023-08-07 DIAGNOSIS — R531 Weakness: Secondary | ICD-10-CM | POA: Diagnosis not present

## 2023-08-09 DIAGNOSIS — R531 Weakness: Secondary | ICD-10-CM | POA: Diagnosis not present

## 2023-08-09 DIAGNOSIS — M170 Bilateral primary osteoarthritis of knee: Secondary | ICD-10-CM | POA: Diagnosis not present

## 2023-08-14 DIAGNOSIS — R531 Weakness: Secondary | ICD-10-CM | POA: Diagnosis not present

## 2023-08-14 DIAGNOSIS — M170 Bilateral primary osteoarthritis of knee: Secondary | ICD-10-CM | POA: Diagnosis not present

## 2023-08-16 DIAGNOSIS — M170 Bilateral primary osteoarthritis of knee: Secondary | ICD-10-CM | POA: Diagnosis not present

## 2023-08-16 DIAGNOSIS — R531 Weakness: Secondary | ICD-10-CM | POA: Diagnosis not present

## 2023-08-19 DIAGNOSIS — R531 Weakness: Secondary | ICD-10-CM | POA: Diagnosis not present

## 2023-08-19 DIAGNOSIS — M170 Bilateral primary osteoarthritis of knee: Secondary | ICD-10-CM | POA: Diagnosis not present

## 2023-08-23 DIAGNOSIS — R531 Weakness: Secondary | ICD-10-CM | POA: Diagnosis not present

## 2023-08-23 DIAGNOSIS — M170 Bilateral primary osteoarthritis of knee: Secondary | ICD-10-CM | POA: Diagnosis not present

## 2023-08-26 DIAGNOSIS — R531 Weakness: Secondary | ICD-10-CM | POA: Diagnosis not present

## 2023-08-26 DIAGNOSIS — M170 Bilateral primary osteoarthritis of knee: Secondary | ICD-10-CM | POA: Diagnosis not present

## 2023-08-27 DIAGNOSIS — H2511 Age-related nuclear cataract, right eye: Secondary | ICD-10-CM | POA: Diagnosis not present

## 2023-08-27 DIAGNOSIS — Z961 Presence of intraocular lens: Secondary | ICD-10-CM | POA: Diagnosis not present

## 2023-08-27 DIAGNOSIS — H25811 Combined forms of age-related cataract, right eye: Secondary | ICD-10-CM | POA: Diagnosis not present

## 2023-08-28 ENCOUNTER — Encounter (HOSPITAL_COMMUNITY): Payer: Self-pay

## 2023-08-28 ENCOUNTER — Other Ambulatory Visit: Payer: Self-pay

## 2023-08-28 ENCOUNTER — Emergency Department (HOSPITAL_COMMUNITY): Payer: Medicare Other

## 2023-08-28 ENCOUNTER — Inpatient Hospital Stay (HOSPITAL_COMMUNITY)
Admission: EM | Admit: 2023-08-28 | Discharge: 2023-08-30 | DRG: 392 | Disposition: A | Discharge status: Home / Self Care | Payer: Medicare Other | Attending: Internal Medicine | Admitting: Internal Medicine

## 2023-08-28 DIAGNOSIS — R0602 Shortness of breath: Secondary | ICD-10-CM | POA: Diagnosis present

## 2023-08-28 DIAGNOSIS — Z885 Allergy status to narcotic agent status: Secondary | ICD-10-CM | POA: Diagnosis not present

## 2023-08-28 DIAGNOSIS — R1084 Generalized abdominal pain: Secondary | ICD-10-CM | POA: Diagnosis not present

## 2023-08-28 DIAGNOSIS — E7800 Pure hypercholesterolemia, unspecified: Secondary | ICD-10-CM | POA: Diagnosis not present

## 2023-08-28 DIAGNOSIS — R110 Nausea: Secondary | ICD-10-CM | POA: Diagnosis not present

## 2023-08-28 DIAGNOSIS — Z8249 Family history of ischemic heart disease and other diseases of the circulatory system: Secondary | ICD-10-CM

## 2023-08-28 DIAGNOSIS — K449 Diaphragmatic hernia without obstruction or gangrene: Secondary | ICD-10-CM | POA: Diagnosis present

## 2023-08-28 DIAGNOSIS — R1310 Dysphagia, unspecified: Secondary | ICD-10-CM | POA: Diagnosis present

## 2023-08-28 DIAGNOSIS — J90 Pleural effusion, not elsewhere classified: Secondary | ICD-10-CM | POA: Diagnosis not present

## 2023-08-28 DIAGNOSIS — A084 Viral intestinal infection, unspecified: Secondary | ICD-10-CM | POA: Diagnosis present

## 2023-08-28 DIAGNOSIS — N1330 Unspecified hydronephrosis: Secondary | ICD-10-CM | POA: Diagnosis not present

## 2023-08-28 DIAGNOSIS — Z79890 Hormone replacement therapy (postmenopausal): Secondary | ICD-10-CM | POA: Diagnosis not present

## 2023-08-28 DIAGNOSIS — Z888 Allergy status to other drugs, medicaments and biological substances status: Secondary | ICD-10-CM | POA: Diagnosis not present

## 2023-08-28 DIAGNOSIS — K5730 Diverticulosis of large intestine without perforation or abscess without bleeding: Secondary | ICD-10-CM | POA: Diagnosis not present

## 2023-08-28 DIAGNOSIS — E039 Hypothyroidism, unspecified: Secondary | ICD-10-CM | POA: Diagnosis present

## 2023-08-28 DIAGNOSIS — K219 Gastro-esophageal reflux disease without esophagitis: Secondary | ICD-10-CM | POA: Diagnosis present

## 2023-08-28 DIAGNOSIS — N2889 Other specified disorders of kidney and ureter: Secondary | ICD-10-CM | POA: Diagnosis present

## 2023-08-28 DIAGNOSIS — Z931 Gastrostomy status: Secondary | ICD-10-CM

## 2023-08-28 LAB — COMPREHENSIVE METABOLIC PANEL
ALT: 20 U/L (ref 0–44)
AST: 20 U/L (ref 15–41)
Albumin: 4.3 g/dL (ref 3.5–5.0)
Alkaline Phosphatase: 59 U/L (ref 38–126)
Anion gap: 8 (ref 5–15)
BUN: 21 mg/dL (ref 8–23)
CO2: 27 mmol/L (ref 22–32)
Calcium: 9.4 mg/dL (ref 8.9–10.3)
Chloride: 103 mmol/L (ref 98–111)
Creatinine, Ser: 0.75 mg/dL (ref 0.44–1.00)
GFR, Estimated: >60 mL/min (ref >60)
Glucose, Bld: 107 mg/dL — ABNORMAL HIGH (ref 70–99)
Potassium: 3.9 mmol/L (ref 3.5–5.1)
Sodium: 138 mmol/L (ref 135–145)
Total Bilirubin: 0.6 mg/dL (ref 0.3–1.2)
Total Protein: 6.8 g/dL (ref 6.5–8.1)

## 2023-08-28 LAB — URINALYSIS, ROUTINE W REFLEX MICROSCOPIC
Bacteria, UA: NONE SEEN
Bilirubin Urine: NEGATIVE
Glucose, UA: NEGATIVE mg/dL
Hgb urine dipstick: NEGATIVE
Ketones, ur: NEGATIVE mg/dL
Nitrite: NEGATIVE
Protein, ur: NEGATIVE mg/dL
Specific Gravity, Urine: 1.029 (ref 1.005–1.030)
pH: 5 (ref 5.0–8.0)

## 2023-08-28 LAB — CBC
HCT: 45 % (ref 36.0–46.0)
Hemoglobin: 14.5 g/dL (ref 12.0–15.0)
MCH: 30.9 pg (ref 26.0–34.0)
MCHC: 32.2 g/dL (ref 30.0–36.0)
MCV: 95.7 fL (ref 80.0–100.0)
Platelets: 320 10*3/uL (ref 150–400)
RBC: 4.7 MIL/uL (ref 3.87–5.11)
RDW: 13.3 % (ref 11.5–15.5)
WBC: 8.7 10*3/uL (ref 4.0–10.5)
nRBC: 0 % (ref 0.0–0.2)

## 2023-08-28 LAB — TROPONIN I (HIGH SENSITIVITY): Troponin I (High Sensitivity): 6 ng/L (ref <18)

## 2023-08-28 LAB — LIPASE, BLOOD: Lipase: 30 U/L (ref 11–51)

## 2023-08-28 MED ORDER — ENOXAPARIN SODIUM 40 MG/0.4ML IJ SOSY
40.0000 mg | PREFILLED_SYRINGE | INTRAMUSCULAR | Status: DC
  Administered 2023-08-29 (×2): 40 mg via SUBCUTANEOUS
  Filled 2023-08-28 (×3): qty 0.4

## 2023-08-28 MED ORDER — PANTOPRAZOLE SODIUM 40 MG IV SOLR
40.0000 mg | INTRAVENOUS | Status: DC
  Administered 2023-08-28 – 2023-08-29 (×2): 40 mg via INTRAVENOUS
  Filled 2023-08-28 (×2): qty 10

## 2023-08-28 MED ORDER — ACETAMINOPHEN 325 MG PO TABS
650.0000 mg | ORAL_TABLET | Freq: Four times a day (QID) | ORAL | Status: DC | PRN
  Administered 2023-08-29 – 2023-08-30 (×3): 650 mg via ORAL
  Filled 2023-08-28 (×3): qty 2

## 2023-08-28 MED ORDER — HYDROMORPHONE HCL 1 MG/ML IJ SOLN: 1.0000 mg | INTRAMUSCULAR | Status: DC | PRN

## 2023-08-28 MED ORDER — MORPHINE SULFATE (PF) 4 MG/ML IV SOLN
4.0000 mg | Freq: Once | INTRAVENOUS | Status: AC
  Administered 2023-08-28: 4 mg via INTRAVENOUS
  Filled 2023-08-28: qty 1

## 2023-08-28 MED ORDER — ONDANSETRON HCL 4 MG PO TABS: 4.0000 mg | ORAL_TABLET | Freq: Four times a day (QID) | ORAL | Status: DC | PRN

## 2023-08-28 MED ORDER — ONDANSETRON HCL 4 MG/2ML IJ SOLN
4.0000 mg | Freq: Four times a day (QID) | INTRAMUSCULAR | Status: DC | PRN
  Administered 2023-08-28: 4 mg via INTRAVENOUS
  Filled 2023-08-28: qty 2

## 2023-08-28 MED ORDER — ACETAMINOPHEN 650 MG RE SUPP: 650.0000 mg | Freq: Four times a day (QID) | RECTAL | Status: DC | PRN

## 2023-08-28 MED ORDER — ZOLPIDEM TARTRATE 5 MG PO TABS
5.0000 mg | ORAL_TABLET | Freq: Once | ORAL | Status: AC
  Administered 2023-08-29: 5 mg via ORAL
  Filled 2023-08-28: qty 1

## 2023-08-28 MED ORDER — HYDROMORPHONE HCL 1 MG/ML IJ SOLN: 0.5000 mg | INTRAMUSCULAR | Status: DC | PRN

## 2023-08-28 MED ORDER — SODIUM CHLORIDE 0.9 % IV SOLN
12.5000 mg | Freq: Four times a day (QID) | INTRAVENOUS | Status: DC | PRN
  Administered 2023-08-29 (×2): 12.5 mg via INTRAVENOUS
  Filled 2023-08-28 (×2): qty 12.5
  Filled 2023-08-28: qty 0.5

## 2023-08-28 MED ORDER — IOHEXOL 300 MG/ML  SOLN
100.0000 mL | Freq: Once | INTRAMUSCULAR | Status: AC | PRN
  Administered 2023-08-28: 100 mL via INTRAVENOUS

## 2023-08-28 MED ORDER — LACTATED RINGERS IV SOLN: INTRAVENOUS | Status: AC

## 2023-08-28 MED ORDER — HYDROMORPHONE HCL 1 MG/ML IJ SOLN
1.0000 mg | Freq: Once | INTRAMUSCULAR | Status: AC
  Administered 2023-08-28: 1 mg via INTRAVENOUS
  Filled 2023-08-28: qty 1

## 2023-08-28 MED ORDER — ONDANSETRON HCL 4 MG/2ML IJ SOLN
4.0000 mg | Freq: Once | INTRAMUSCULAR | Status: AC
  Administered 2023-08-28: 4 mg via INTRAVENOUS
  Filled 2023-08-28: qty 2

## 2023-08-28 MED ORDER — SODIUM CHLORIDE 0.9 % IV BOLUS
1000.0000 mL | Freq: Once | INTRAVENOUS | Status: AC
  Administered 2023-08-28: 1000 mL via INTRAVENOUS

## 2023-08-28 NOTE — ED Notes (Signed)
[facility] at bedside for transport. Pt continues to refused lovenox. Sts "not at this time." Phenergan requested from main rx has not arrived.

## 2023-08-28 NOTE — ED Triage Notes (Signed)
Pt reports difficulty breathing and abdominal pain since this morning. Hx of 3 hiatal hernias. Pt last BM was this morning. Pt reports nausea but denies vomiting.

## 2023-08-28 NOTE — ED Provider Notes (Signed)
[facility] Provider Note   CSN: [id number] Arrival date & time: 08/28/23  1013     History  Chief Complaint  Patient presents with   Abdominal Pain   Shortness of Breath    Monica White is a 82 y.o. female history of recurrent hiatal hernia here presenting with abdominal pain and shortness of breath.  Patient has been having abdominal pain since this morning.  She states that she has very bad hiatal hernia that requires multiple surgeries.  Patient states that it radiates to the left arm.  Patient has some subjective shortness of breath as well.  Patient denies any recent travel.  Patient called her surgeon and was sent here for further evaluation.  The history is provided by the patient.       Home Medications Prior to Admission medications   Medication Sig Start Date End Date Taking? Authorizing Provider  acetaminophen (TYLENOL) 325 MG tablet Take 2 tablets (650 mg total) by mouth every 6 (six) hours as needed. 02/19/23   Maczis, Elmer Sow, PA-C  Ascorbic Acid (VITAMIN C PO) Take 1 tablet by mouth daily.    [provider]  cholecalciferol (VITAMIN D3) 25 MCG (1000 UNIT) tablet Take 1,000 Units by mouth daily.    [provider]  Cyanocobalamin (B-12) 1000 MCG CAPS Take 1,000 mcg by mouth daily.    [provider]  estradiol (ESTRACE) 0.5 MG tablet Take 0.5 mg by mouth daily.    [provider]  levothyroxine (SYNTHROID, LEVOTHROID) 50 MCG tablet Take 50 mcg by mouth daily. 02/27/15   [provider]  medroxyPROGESTERone (PROVERA) 2.5 MG tablet Take 1 tablet (2.5 mg total) by mouth daily. 03/04/15   Patton Salles, MD  Multiple Vitamins-Minerals (MULTIVITAMIN PO) Take 1 tablet by mouth daily.    [provider]  ondansetron (ZOFRAN-ODT) 4 MG disintegrating tablet Take 1 tablet (4 mg total) by mouth every 6 (six) hours as needed for nausea. 11/09/22   Kinsinger, De Blanch, MD   oxyCODONE (OXY IR/ROXICODONE) 5 MG immediate release tablet Take 1 tablet (5 mg total) by mouth every 6 (six) hours as needed for breakthrough pain. 02/19/23   Maczis, Elmer Sow, PA-C  pantoprazole (PROTONIX) 40 MG tablet Take 1 tablet (40 mg total) by mouth 2 (two) times daily. 02/19/23   Maczis, Elmer Sow, PA-C  Probiotic Product (PROBIOTIC PO) Take 1 capsule by mouth daily. Engineer, building services, Historical, MD      Allergies    Atorvastatin, Escitalopram, Pitavastatin, and Tramadol    Review of Systems   Review of Systems  Respiratory:  Positive for shortness of breath.   Gastrointestinal:  Positive for abdominal pain.  All other systems reviewed and are negative.   Physical Exam Updated Vital Signs BP (!) 182/89 (BP Location: Left Arm)   Pulse 68   Temp 97.9 F (36.6 C) (Oral)   Resp 18   Ht 5\' 7"  (1.702 m)   Wt 55.3 kg   LMP 01/13/1994   SpO2 100%   BMI 19.11 kg/m  Physical Exam Vitals and nursing note reviewed.  Constitutional:      Comments: Chronically ill and uncomfortable  HENT:     Head: Normocephalic.     Mouth/Throat:     Pharynx: Oropharynx is clear.  Eyes:     Extraocular Movements: Extraocular movements intact.     Pupils: Pupils are equal, round, and reactive to light.  Cardiovascular:  Rate and Rhythm: Normal rate and regular rhythm.  Pulmonary:     Effort: Pulmonary effort is normal.     Breath sounds: Normal breath sounds.  Abdominal:     Comments: Previous surgical scars in the epigastrium.  Patient has minimal epigastric tenderness  Skin:    General: Skin is warm.     Capillary Refill: Capillary refill takes less than 2 seconds.  Neurological:     General: No focal deficit present.     Mental Status: She is alert.  Psychiatric:        Mood and Affect: Mood normal.        Behavior: Behavior normal.     ED Results / Procedures / Treatments   Labs (all labs ordered are listed, but only abnormal results are displayed) Labs Reviewed   COMPREHENSIVE METABOLIC PANEL - Abnormal; Notable for the following components:      Result Value   Glucose, Bld 107 (*)    All other components within normal limits  URINALYSIS, ROUTINE W REFLEX MICROSCOPIC - Abnormal; Notable for the following components:   Leukocytes,Ua TRACE (*)    All other components within normal limits  LIPASE, BLOOD  CBC  TROPONIN I (HIGH SENSITIVITY)    EKG None  Radiology No results found.  Procedures Procedures    Medications Ordered in ED Medications  sodium chloride 0.9 % bolus 1,000 mL (1,000 mLs Intravenous New Bag/Given 08/28/23 1635)  morphine (PF) 4 MG/ML injection 4 mg (4 mg Intravenous Given 08/28/23 1634)  ondansetron (ZOFRAN) injection 4 mg (4 mg Intravenous Given 08/28/23 1634)    ED Course/ Medical Decision Making/ A&P                                 Medical Decision Making Monica White is a 82 y.o. female here presenting with abdominal pain.  Patient has history of hiatal hernia with multiple surgery.  Concern for possible recurrent hernia versus volvulus versus obstruction.  Plan to get CBC and CMP and CT chest abdomen pelvis  5 pm Dr. Sheliah Hatch came to see the patient.  He agreed with the CT scan and will let Dr. Dwain Sarna know  8 PM CT showed large hiatal hernia with multiple jejunal loops with no obstruction.  I discussed case with Dr. Dwain Sarna.  He states that patient does not need NG tube right now.  He recommend hospitalist admission and transfer to [facility] for general surgery and thoracic surgery to see tomorrow.  Will keep n.p.o. after midnight.   Problems Addressed: Hiatal hernia: chronic illness or injury with exacerbation, progression, or side effects of treatment  Amount and/or Complexity of Data Reviewed Labs: ordered. Decision-making details documented in ED Course. Radiology: ordered and independent interpretation performed. Decision-making details documented in ED Course. ECG/medicine tests: ordered and  independent interpretation performed. Decision-making details documented in ED Course.  Risk Prescription drug management. Decision regarding hospitalization.    Final Clinical Impression(s) / ED Diagnoses Final diagnoses:  None    Rx / DC Orders ED Discharge Orders     None         Charlynne Pander, MD 08/28/23 2256

## 2023-08-28 NOTE — ED Notes (Signed)
Pt currently NV. PRN zofran given. Requests to hold ambien and lovenox at this time.

## 2023-08-28 NOTE — H&P (Signed)
History and Physical    Monica White ZOX:[id number] DOB: 04/10/1941 DOA: 08/28/2023  PCP: Rodrigo Ran, MD   Patient coming from: Home   Chief Complaint: Abdominal pain, SOB   HPI: Monica White is a 82 y.o. female with medical history significant for hypothyroidism, hyperlipidemia with statin intolerance, and hiatal hernia who presents with abdominal pain and shortness of breath.  Patient reports that she had cataract surgery yesterday which was uneventful and she was doing well this morning when she developed  nausea and pain in her abdomen, shoulder, and back.  She continued to have nausea and pain became severe, prompting her presentation to the ED.  She went on to develop recurrent bouts of nonbloody vomiting in the emergency department.  ED Course: Upon arrival to the ED, patient is found to be afebrile and saturating well on room air with normal RR, normal HR, and elevated blood pressure.  Labs are most notable for normal creatinine, normal WBC, and normal hemoglobin.  CT demonstrates left diaphragmatic hernia containing multiple loops of jejunum and large hiatal hernia containing most of stomach and transverse colon within the chest, as well as right proximal ureteral thickening and right pelviectasis.  Surgery was consulted by the ED physician and the patient was treated with 1 L of NS, Dilaudid, morphine, and Zofran in the ED.  Review of Systems:  All other systems reviewed and apart from HPI, are negative.  Past Medical History:  Diagnosis Date   Anemia    Arthritis    Dyspnea    GERD (gastroesophageal reflux disease)    History of hiatal hernia    Hypercholesteremia    Hypothyroid 2015   PONV (postoperative nausea and vomiting)     Past Surgical History:  Procedure Laterality Date   COLONOSCOPY     HIATAL HERNIA REPAIR N/A 03/12/2022   Procedure: LAPAROSCOPIC REPAIR OF HIATAL HERNIA;  Surgeon: Rodman Pickle, MD;  Location: [facility] ORS;  Service: General;  Laterality:  N/A;   IR GASTROSTOMY TUBE REMOVAL  02/15/2023   IR REPLC GASTRO/COLONIC TUBE PERCUT W/FLUORO  03/06/2023   RIGHT HEART CATH AND CORONARY ANGIOGRAPHY N/A 12/01/2021   Procedure: RIGHT HEART CATH AND CORONARY ANGIOGRAPHY;  Surgeon: Tonny Bollman, MD;  Location: [facility] INVASIVE CV LAB;  Service: Cardiovascular;  Laterality: N/A;   TUBAL LIGATION  11/27/1983   UPPER GASTROINTESTINAL ENDOSCOPY     UPPER GI ENDOSCOPY N/A 02/09/2023   Procedure: UPPER GI ENDOSCOPY; DECOMPRESSION OF GASTRIC VOLVULUS; LAPAROSCOPIC GASTROSTOMY TUBE PLACEMENT;  Surgeon: Quentin Ore, MD;  Location: [facility] OR;  Service: General;  Laterality: N/A;   XI ROBOTIC ASSISTED HIATAL HERNIA REPAIR N/A 11/06/2022   Procedure: XI ROBOTIC REDUCTIONOF INCARCERATED HIATAL HERNIA REPAIR, LAPAROSCOPIC G-TUBE INSERTION, EGD;  Surgeon: Kinsinger, De Blanch, MD;  Location: [facility] ORS;  Service: General;  Laterality: N/A;    Social History:   reports that she has never smoked. She has never used smokeless tobacco. She reports that she does not currently use alcohol. She reports that she does not use drugs.  Allergies  Allergen Reactions   Atorvastatin Other (See Comments)    Stiffness   Escitalopram Other (See Comments)    Stiffness   Pitavastatin Other (See Comments)    Stiffness   Tramadol Other (See Comments)    "Felt bad and headache"    Family History  Problem Relation Age of Onset   Heart disease Mother      Prior to Admission medications   Medication Sig Start Date End Date  Taking? Authorizing Provider  acetaminophen (TYLENOL) 325 MG tablet Take 2 tablets (650 mg total) by mouth every 6 (six) hours as needed. 02/19/23   Maczis, Elmer Sow, PA-C  Ascorbic Acid (VITAMIN C PO) Take 1 tablet by mouth daily.    [provider]  cholecalciferol (VITAMIN D3) 25 MCG (1000 UNIT) tablet Take 1,000 Units by mouth daily.    [provider]  Cyanocobalamin (B-12) 1000 MCG CAPS Take 1,000 mcg by mouth daily.     [provider]  estradiol (ESTRACE) 0.5 MG tablet Take 0.5 mg by mouth daily.    [provider]  levothyroxine (SYNTHROID, LEVOTHROID) 50 MCG tablet Take 50 mcg by mouth daily. 02/27/15   [provider]  medroxyPROGESTERone (PROVERA) 2.5 MG tablet Take 1 tablet (2.5 mg total) by mouth daily. 03/04/15   Patton Salles, MD  Multiple Vitamins-Minerals (MULTIVITAMIN PO) Take 1 tablet by mouth daily.    [provider]  ondansetron (ZOFRAN-ODT) 4 MG disintegrating tablet Take 1 tablet (4 mg total) by mouth every 6 (six) hours as needed for nausea. 11/09/22   Kinsinger, De Blanch, MD  oxyCODONE (OXY IR/ROXICODONE) 5 MG immediate release tablet Take 1 tablet (5 mg total) by mouth every 6 (six) hours as needed for breakthrough pain. 02/19/23   Maczis, Elmer Sow, PA-C  pantoprazole (PROTONIX) 40 MG tablet Take 1 tablet (40 mg total) by mouth 2 (two) times daily. 02/19/23   Maczis, Elmer Sow, PA-C  Probiotic Product (PROBIOTIC PO) Take 1 capsule by mouth daily. Align    [provider]    Physical Exam: Vitals:   08/28/23 1339 08/28/23 1616 08/28/23 1732 08/28/23 2051  BP: (!) 151/81 (!) 182/89 (!) 153/82 (!) 142/88  Pulse: 78 68 66 78  Resp: 16 18 18 18   Temp: 97.9 F (36.6 C)  97.9 F (36.6 C) 98 F (36.7 C)  TempSrc: Oral  Oral Oral  SpO2: 95% 100% 98% 94%  Weight:      Height:        Constitutional: NAD, no pallor or diaphoresis   Eyes: PERTLA, lids and conjunctivae normal ENMT: Mucous membranes are moist. Posterior pharynx clear of any exudate or lesions.   Neck: supple, no masses  Respiratory: no wheezing, no crackles. No accessory muscle use.  Cardiovascular: S1 & S2 heard, regular rate and rhythm. No extremity edema.   Abdomen: Soft, no guarding. Bowel sounds active.  Musculoskeletal: no clubbing / cyanosis. No joint deformity upper and lower extremities.   Skin: no significant rashes, lesions, ulcers. Warm, dry,  well-perfused. Neurologic: CN 2-12 grossly intact. Moving all extremities. Alert and oriented.  Psychiatric: Calm. Cooperative.    Labs and Imaging on Admission: I have personally reviewed following labs and imaging studies  CBC: Recent Labs  Lab 08/28/23 1045  WBC 8.7  HGB 14.5  HCT 45.0  MCV 95.7  PLT 320   Basic Metabolic Panel: Recent Labs  Lab 08/28/23 1045  NA 138  K 3.9  CL 103  CO2 27  GLUCOSE 107*  BUN 21  CREATININE 0.75  CALCIUM 9.4   GFR: Estimated Creatinine Clearance: 47.3 mL/min (by C-G formula based on SCr of 0.75 mg/dL). Liver Function Tests: Recent Labs  Lab 08/28/23 1045  AST 20  ALT 20  ALKPHOS 59  BILITOT 0.6  PROT 6.8  ALBUMIN 4.3   Recent Labs  Lab 08/28/23 1045  LIPASE 30   No results for input(s): "AMMONIA" in the last 168 hours. Coagulation  Profile: No results for input(s): "INR", "PROTIME" in the last 168 hours. Cardiac Enzymes: No results for input(s): "CKTOTAL", "CKMB", "CKMBINDEX", "TROPONINI" in the last 168 hours. BNP (last 3 results) No results for input(s): "PROBNP" in the last 8760 hours. HbA1C: No results for input(s): "HGBA1C" in the last 72 hours. CBG: No results for input(s): "GLUCAP" in the last 168 hours. Lipid Profile: No results for input(s): "CHOL", "HDL", "LDLCALC", "TRIG", "CHOLHDL", "LDLDIRECT" in the last 72 hours. Thyroid Function Tests: No results for input(s): "TSH", "T4TOTAL", "FREET4", "T3FREE", "THYROIDAB" in the last 72 hours. Anemia Panel: No results for input(s): "VITAMINB12", "FOLATE", "FERRITIN", "TIBC", "IRON", "RETICCTPCT" in the last 72 hours. Urine analysis:    Component Value Date/Time   COLORURINE YELLOW 08/28/2023 1045   APPEARANCEUR CLEAR 08/28/2023 1045   LABSPEC 1.029 08/28/2023 1045   PHURINE 5.0 08/28/2023 1045   GLUCOSEU NEGATIVE 08/28/2023 1045   HGBUR NEGATIVE 08/28/2023 1045   BILIRUBINUR NEGATIVE 08/28/2023 1045   KETONESUR NEGATIVE 08/28/2023 1045   PROTEINUR  NEGATIVE 08/28/2023 1045   NITRITE NEGATIVE 08/28/2023 1045   LEUKOCYTESUR TRACE (A) 08/28/2023 1045   Sepsis Labs: @LABRCNTIP (procalcitonin:4,lacticidven:4) )No results found for this or any previous visit (from the past 240 hour(s)).   Radiological Exams on Admission: CT CHEST ABDOMEN PELVIS W CONTRAST  Result Date: 08/28/2023 CLINICAL DATA:  Sepsis, abdominal pain, history of hiatal hernia with multiple complications. Difficulty breathing and nausea EXAM: CT CHEST, ABDOMEN, AND PELVIS WITH CONTRAST TECHNIQUE: Multidetector CT imaging of the chest, abdomen and pelvis was performed following the standard protocol during bolus administration of intravenous contrast. RADIATION DOSE REDUCTION: This exam was performed according to the departmental dose-optimization program which includes automated exposure control, adjustment of the mA and/or kV according to patient size and/or use of iterative reconstruction technique. CONTRAST:  OMNIPAQUE IOHEXOL 300 MG/ML  SOLN COMPARISON:  CT abdomen and pelvis 03/11/2023 and CT chest abdomen and pelvis 02/15/2023 FINDINGS: CT CHEST FINDINGS Cardiovascular: Coronary artery and aortic atherosclerotic calcification. No pericardial effusion. Mediastinum/Nodes: Trachea is unremarkable. No thoracic adenopathy. Large sliding-type hiatal hernia with most of the stomach in the chest. Lungs/Pleura: Compressive atelectasis in the left upper and lower lobe secondary to the large hiatal and diaphragmatic hernias. Ground-glass opacities are likely due to atelectasis. Small left pleural effusion. The right lung is clear. No pneumothorax. Musculoskeletal: No acute fracture. CT ABDOMEN PELVIS FINDINGS Hepatobiliary: Scattered hepatic cysts. Hepatic steatosis. Gallbladder and biliary tree are unremarkable. Pancreas: Unremarkable. Spleen: Unremarkable. Adrenals/Urinary Tract: Stable adrenal glands and kidneys. No obstructing urinary calculi or hydronephrosis. Question urothelial  thickening in the proximal right ureter (series 8/image 67) with mild right pelviectasis, increased from 11/10/2023. No evidence of obstruction or filling defects on delayed images. Unremarkable bladder. Stomach/Bowel: Large hiatal hernia containing most of the stomach in the transverse colon within the chest. New additional left diaphragmatic hernia containing multiple jejunal loops. No evidence of obstruction. No bowel wall thickening. Mobile cecum in the central abdomen. Normal appendix. Colonic diverticulosis without diverticulitis. The previous gastrostomy tube has been removed. Vascular/Lymphatic: Aortic atherosclerosis. No enlarged abdominal or pelvic lymph nodes. Reproductive: Unchanged 3.4 cm left adnexal cyst.  Fibroid uterus. Other: Low-density free fluid within the hiatal and diaphragmatic hernia sacs in the left upper quadrant. No free intraperitoneal air. Musculoskeletal: Left convex thoracolumbar curve. Degenerative spondylosis in the thoracolumbar spine. No acute fracture. IMPRESSION: 1. New left diaphragmatic hernia containing multiple jejunal loops. No evidence of obstruction. 2. Large hiatal hernia containing most of the stomach and transverse colon within the  chest. Small left pleural effusion. Compressive atelectasis in the left upper and lower lobes. 3. Question urothelial thickening in the proximal right ureter with new mild right pelviectasis. No evidence of filling defects on delayed images however urothelial malignancy is not excluded. Consider urology referral. 4. Colonic diverticulosis without diverticulitis. Aortic Atherosclerosis (ICD10-I70.0). Electronically Signed   By: Minerva Fester M.D.   On: 08/28/2023 19:39     Assessment/Plan   1. Paraesophageal/diaphragmatic hernia  - Keep NPO, continue IVF, pain-control, antiemetics, monitor volume status and electrolytes, follow-up on surgery's recommendations    2. Hypothyroidism  - Consider IV levothyroxine if still NPO in a few  days   3. Right renal pelviectasis  - Noted incidentally on CT, urology follow-up recommended     DVT prophylaxis: Lovenox  Code Status: Full  Level of Care: Level of care: Med-Surg Family Communication: Son at bedside   Disposition Plan:  Patient is from: Home   Anticipated d/c is to: TBD Anticipated d/c date is: 09/02/23 Patient currently: Pending surgical consultation and plan Consults called: Surgery  Admission status: Inpatient     Briscoe Deutscher, MD [facility]  08/28/2023, 9:22 PM

## 2023-08-28 NOTE — ED Notes (Signed)
ED TO INPATIENT HANDOFF REPORT  ED Nurse Name and Phone #:  Leanord Hawking, RN  S Name/Age/Gender Monica White 82 y.o. female Room/Bed: WA13/WA13  Code Status   Code Status: Full Code  Home/SNF/Other Home Patient oriented to: self, place, time, and situation Is this baseline? Yes   Triage Complete: Triage complete  Chief Complaint Diaphragmatic hernia [K44.9]  Triage Note Pt reports difficulty breathing and abdominal pain since this morning. Hx of 3 hiatal hernias. Pt last BM was this morning. Pt reports nausea but denies vomiting.    Allergies Allergies  Allergen Reactions   Atorvastatin Other (See Comments)    Stiffness   Escitalopram Other (See Comments)    Stiffness   Pitavastatin Other (See Comments)    Stiffness   Tramadol Other (See Comments)    "Felt bad and headache"    Level of Care/Admitting Diagnosis ED Disposition     ED Disposition  Admit   Condition  --   Comment  Hospital Area: [facility] [[id number]]  Level of Care: Med-Surg [16]  May admit patient to [facility] or [facility] if equivalent level of care is available:: No  Covid Evaluation: Asymptomatic - no recent exposure (last 10 days) testing not required  Diagnosis: Diaphragmatic hernia [197820]  Admitting Physician: Briscoe Deutscher [[id number]]  Attending Physician: Briscoe Deutscher [[id number]]  Certification:: I certify this patient will need inpatient services for at least 2 midnights  Expected Medical Readiness: 08/31/2023          B Medical/Surgery History Past Medical History:  Diagnosis Date   Anemia    Arthritis    Dyspnea    GERD (gastroesophageal reflux disease)    History of hiatal hernia    Hypercholesteremia    Hypothyroid 2015   PONV (postoperative nausea and vomiting)    Past Surgical History:  Procedure Laterality Date   COLONOSCOPY     HIATAL HERNIA REPAIR N/A 03/12/2022   Procedure: LAPAROSCOPIC REPAIR OF HIATAL HERNIA;  Surgeon: Rodman Pickle, MD;  Location: [facility];  Service: General;  Laterality: N/A;   IR GASTROSTOMY TUBE REMOVAL  02/15/2023   IR REPLC GASTRO/COLONIC TUBE PERCUT W/FLUORO  03/06/2023   RIGHT HEART CATH AND CORONARY ANGIOGRAPHY N/A 12/01/2021   Procedure: RIGHT HEART CATH AND CORONARY ANGIOGRAPHY;  Surgeon: Tonny Bollman, MD;  Location: [facility] INVASIVE CV LAB;  Service: Cardiovascular;  Laterality: N/A;   TUBAL LIGATION  11/27/1983   UPPER GASTROINTESTINAL ENDOSCOPY     UPPER GI ENDOSCOPY N/A 02/09/2023   Procedure: UPPER GI ENDOSCOPY; DECOMPRESSION OF GASTRIC VOLVULUS; LAPAROSCOPIC GASTROSTOMY TUBE PLACEMENT;  Surgeon: Quentin Ore, MD;  Location: [facility] OR;  Service: General;  Laterality: N/A;   XI ROBOTIC ASSISTED HIATAL HERNIA REPAIR N/A 11/06/2022   Procedure: XI ROBOTIC REDUCTIONOF INCARCERATED HIATAL HERNIA REPAIR, LAPAROSCOPIC G-TUBE INSERTION, EGD;  Surgeon: Kinsinger, De Blanch, MD;  Location: [facility];  Service: General;  Laterality: N/A;     A IV Location/Drains/Wounds Patient Lines/Drains/Airways Status     Active Line/Drains/Airways     Name Placement date Placement time Site Days   Peripheral IV 08/28/23 20 G Left Antecubital 08/28/23  1628  Antecubital  less than 1   Gastrostomy/Enterostomy 20 Fr. LUQ 03/06/23  1300  LUQ  175   Incision - 3 Ports Abdomen 1: Umbilicus 2: Mid;Upper 3: Upper;Right 02/09/23  1007  -- 200   Incision - 6 Ports Abdomen 1: Left;Lateral 2: Left 3: Umbilicus 4: Right 5: Right;Lateral 6: Upper 03/12/22  1210  -- 534   Incision - 6 Ports Abdomen 1: Left;Lateral;Mid 2: Mid;Left 3: Upper;Mid 4: Medial 5: Right;Mid 6: Mid;Lateral 11/06/22  1438  -- 295            Intake/Output Last 24 hours No intake or output data in the 24 hours ending 08/28/23 2137  Labs/Imaging Results for orders placed or performed during the hospital encounter of 08/28/23 (from the past 48 hour(s))  Lipase, blood     Status: None   Collection Time: 08/28/23 10:45 AM  Result Value Ref  Range   Lipase 30 11 - 51 U/L    Comment: Performed at [facility], [address] [place], [place] [address]  Comprehensive metabolic panel     Status: Abnormal   Collection Time: 08/28/23 10:45 AM  Result Value Ref Range   Sodium 138 135 - 145 mmol/L   Potassium 3.9 3.5 - 5.1 mmol/L   Chloride 103 98 - 111 mmol/L   CO2 27 22 - 32 mmol/L   Glucose, Bld 107 (H) 70 - 99 mg/dL    Comment: Glucose reference range applies only to samples taken after fasting for at least 8 hours.   BUN 21 8 - 23 mg/dL   Creatinine, Ser 1.91 0.44 - 1.00 mg/dL   Calcium 9.4 8.9 - 47.8 mg/dL   Total Protein 6.8 6.5 - 8.1 g/dL   Albumin 4.3 3.5 - 5.0 g/dL   AST 20 15 - 41 U/L   ALT 20 0 - 44 U/L   Alkaline Phosphatase 59 38 - 126 U/L   Total Bilirubin 0.6 0.3 - 1.2 mg/dL   GFR, Estimated >29 >56 mL/min    Comment: (NOTE) Calculated using the CKD-EPI Creatinine Equation (2021)    Anion gap 8 5 - 15    Comment: Performed at [facility], [address] [place], [place] [address]  CBC     Status: None   Collection Time: 08/28/23 10:45 AM  Result Value Ref Range   WBC 8.7 4.0 - 10.5 K/uL   RBC 4.70 3.87 - 5.11 MIL/uL   Hemoglobin 14.5 12.0 - 15.0 g/dL   HCT 65.7 84.6 - 96.2 %   MCV 95.7 80.0 - 100.0 fL   MCH 30.9 26.0 - 34.0 pg   MCHC 32.2 30.0 - 36.0 g/dL   RDW 95.2 84.1 - 32.4 %   Platelets 320 150 - 400 K/uL   nRBC 0.0 0.0 - 0.2 %    Comment: Performed at [facility], [address] [place], [place] [address]  Urinalysis, Routine w reflex microscopic -Urine, Clean Catch     Status: Abnormal   Collection Time: 08/28/23 10:45 AM  Result Value Ref Range   Color, Urine YELLOW YELLOW   APPearance CLEAR CLEAR   Specific Gravity, Urine 1.029 1.005 - 1.030   pH 5.0 5.0 - 8.0   Glucose, UA NEGATIVE NEGATIVE mg/dL   Hgb urine dipstick NEGATIVE NEGATIVE   Bilirubin Urine NEGATIVE NEGATIVE   Ketones, ur NEGATIVE NEGATIVE mg/dL   Protein, ur  NEGATIVE NEGATIVE mg/dL   Nitrite NEGATIVE NEGATIVE   Leukocytes,Ua TRACE (A) NEGATIVE   RBC / HPF 0-5 0 - 5 RBC/hpf   WBC, UA 0-5 0 - 5 WBC/hpf   Bacteria, UA NONE SEEN NONE SEEN   Squamous Epithelial / HPF 0-5 0 - 5 /HPF   Mucus PRESENT     Comment: Performed at [facility], [address]  [place], [place] [address]  Troponin I (High Sensitivity)     Status: None   Collection Time: 08/28/23  4:34 PM  Result Value Ref Range   Troponin I (High Sensitivity) 6 <18 ng/L    Comment: (NOTE) Elevated high sensitivity troponin I (hsTnI) values and significant  changes across serial measurements may suggest ACS but many other  chronic and acute conditions are known to elevate hsTnI results.  Refer to the "Links" section for chest pain algorithms and additional  guidance. Performed at [facility], [address], [place], [place] [address]    CT CHEST ABDOMEN PELVIS W CONTRAST  Result Date: 08/28/2023 CLINICAL DATA:  Sepsis, abdominal pain, history of hiatal hernia with multiple complications. Difficulty breathing and nausea EXAM: CT CHEST, ABDOMEN, AND PELVIS WITH CONTRAST TECHNIQUE: Multidetector CT imaging of the chest, abdomen and pelvis was performed following the standard protocol during bolus administration of intravenous contrast. RADIATION DOSE REDUCTION: This exam was performed according to the departmental dose-optimization program which includes automated exposure control, adjustment of the mA and/or kV according to patient size and/or use of iterative reconstruction technique. CONTRAST:  OMNIPAQUE IOHEXOL 300 MG/ML  SOLN COMPARISON:  CT abdomen and pelvis 03/11/2023 and CT chest abdomen and pelvis 02/15/2023 FINDINGS: CT CHEST FINDINGS Cardiovascular: Coronary artery and aortic atherosclerotic calcification. No pericardial effusion. Mediastinum/Nodes: Trachea is unremarkable. No thoracic adenopathy. Large sliding-type hiatal hernia with most  of the stomach in the chest. Lungs/Pleura: Compressive atelectasis in the left upper and lower lobe secondary to the large hiatal and diaphragmatic hernias. Ground-glass opacities are likely due to atelectasis. Small left pleural effusion. The right lung is clear. No pneumothorax. Musculoskeletal: No acute fracture. CT ABDOMEN PELVIS FINDINGS Hepatobiliary: Scattered hepatic cysts. Hepatic steatosis. Gallbladder and biliary tree are unremarkable. Pancreas: Unremarkable. Spleen: Unremarkable. Adrenals/Urinary Tract: Stable adrenal glands and kidneys. No obstructing urinary calculi or hydronephrosis. Question urothelial thickening in the proximal right ureter (series 8/image 67) with mild right pelviectasis, increased from 11/10/2023. No evidence of obstruction or filling defects on delayed images. Unremarkable bladder. Stomach/Bowel: Large hiatal hernia containing most of the stomach in the transverse colon within the chest. New additional left diaphragmatic hernia containing multiple jejunal loops. No evidence of obstruction. No bowel wall thickening. Mobile cecum in the central abdomen. Normal appendix. Colonic diverticulosis without diverticulitis. The previous gastrostomy tube has been removed. Vascular/Lymphatic: Aortic atherosclerosis. No enlarged abdominal or pelvic lymph nodes. Reproductive: Unchanged 3.4 cm left adnexal cyst.  Fibroid uterus. Other: Low-density free fluid within the hiatal and diaphragmatic hernia sacs in the left upper quadrant. No free intraperitoneal air. Musculoskeletal: Left convex thoracolumbar curve. Degenerative spondylosis in the thoracolumbar spine. No acute fracture. IMPRESSION: 1. New left diaphragmatic hernia containing multiple jejunal loops. No evidence of obstruction. 2. Large hiatal hernia containing most of the stomach and transverse colon within the chest. Small left pleural effusion. Compressive atelectasis in the left upper and lower lobes. 3. Question urothelial  thickening in the proximal right ureter with new mild right pelviectasis. No evidence of filling defects on delayed images however urothelial malignancy is not excluded. Consider urology referral. 4. Colonic diverticulosis without diverticulitis. Aortic Atherosclerosis (ICD10-I70.0). Electronically Signed   By: Minerva Fester M.D.   On: 08/28/2023 19:39    Pending Labs Unresulted Labs (From admission, onward)     Start     Ordered   09/04/23 0500  Creatinine, serum  (enoxaparin (LOVENOX)    CrCl >/= 30 ml/min)  Weekly,   R  Comments: while on enoxaparin therapy    08/28/23 2122   08/29/23 0500  Basic metabolic panel  Daily,   R      08/28/23 2122   08/29/23 0500  CBC  Daily,   R      08/28/23 2122            Vitals/Pain Today's Vitals   08/28/23 1732 08/28/23 1732 08/28/23 1931 08/28/23 2051  BP: (!) 153/82   (!) 142/88  Pulse: 66   78  Resp: 18   18  Temp: 97.9 F (36.6 C)   98 F (36.7 C)  TempSrc: Oral   Oral  SpO2: 98%   94%  Weight:      Height:      PainSc: 8  8  9       Isolation Precautions No active isolations  Medications Medications  zolpidem (AMBIEN) tablet 5 mg (has no administration in time range)  HYDROmorphone (DILAUDID) injection 0.5-1 mg (has no administration in time range)  pantoprazole (PROTONIX) injection 40 mg (has no administration in time range)  enoxaparin (LOVENOX) injection 40 mg (has no administration in time range)  lactated ringers infusion (has no administration in time range)  acetaminophen (TYLENOL) tablet 650 mg (has no administration in time range)    Or  acetaminophen (TYLENOL) suppository 650 mg (has no administration in time range)  ondansetron (ZOFRAN) tablet 4 mg (has no administration in time range)    Or  ondansetron (ZOFRAN) injection 4 mg (has no administration in time range)  promethazine (PHENERGAN) 12.5 mg in sodium chloride 0.9 % 50 mL IVPB (has no administration in time range)  sodium chloride 0.9 % bolus 1,000  mL (0 mLs Intravenous Stopped 08/28/23 1932)  morphine (PF) 4 MG/ML injection 4 mg (4 mg Intravenous Given 08/28/23 1634)  ondansetron (ZOFRAN) injection 4 mg (4 mg Intravenous Given 08/28/23 1634)  iohexol (OMNIPAQUE) 300 MG/ML solution 100 mL (100 mLs Intravenous Contrast Given 08/28/23 1758)  HYDROmorphone (DILAUDID) injection 1 mg (1 mg Intravenous Given 08/28/23 2109)   3 Mobility walks with person assist     Focused Assessments Abdominal pain   R Recommendations: See Admitting Provider Note  Report given to:   Additional Notes:  N/A

## 2023-08-28 NOTE — Progress Notes (Signed)
Patient ID: Monica White, female   DOB: 10/24/1941, 82 y.o.   MRN: [id number] Patient seen by Dr Sheliah Hatch earlier prior to CT scan.  CT scan reviewed by me and shows significant paraesophageal/diaphragmatic hernia with compresion of left lung.  This has progressed since her last scan.  She does not appear obstructed and don't know that an NG will help (or can be placed).  Likely needs operative therapy but not emergent.  This certainly may require a combined approach with thoracic surgery and transfer to [facility] at some point may be the best plan.  For now admission, npo and observation reasonable.

## 2023-08-28 NOTE — ED Notes (Signed)
Pt ambulatory to the restroom, felt a little bit weak and nauseated RN made aware

## 2023-08-28 NOTE — ED Notes (Signed)
Pt was given a urine cup and informed that we needed a urine sample whenever pt goes to the bathroom. Pt stated she couldn't give [place] any right now.

## 2023-08-28 NOTE — ED Notes (Signed)
[facility] contacted to provide transport to [facility] [id number].

## 2023-08-29 DIAGNOSIS — K449 Diaphragmatic hernia without obstruction or gangrene: Secondary | ICD-10-CM | POA: Diagnosis not present

## 2023-08-29 LAB — CBC
HCT: 41.1 % (ref 36.0–46.0)
Hemoglobin: 13.5 g/dL (ref 12.0–15.0)
MCH: 31.1 pg (ref 26.0–34.0)
MCHC: 32.8 g/dL (ref 30.0–36.0)
MCV: 94.7 fL (ref 80.0–100.0)
Platelets: 287 10*3/uL (ref 150–400)
RBC: 4.34 MIL/uL (ref 3.87–5.11)
RDW: 13.5 % (ref 11.5–15.5)
WBC: 7.9 10*3/uL (ref 4.0–10.5)
nRBC: 0 % (ref 0.0–0.2)

## 2023-08-29 LAB — BASIC METABOLIC PANEL
Anion gap: 13 (ref 5–15)
BUN: 14 mg/dL (ref 8–23)
CO2: 22 mmol/L (ref 22–32)
Calcium: 8.6 mg/dL — ABNORMAL LOW (ref 8.9–10.3)
Chloride: 105 mmol/L (ref 98–111)
Creatinine, Ser: 0.67 mg/dL (ref 0.44–1.00)
GFR, Estimated: >60 mL/min (ref >60)
Glucose, Bld: 120 mg/dL — ABNORMAL HIGH (ref 70–99)
Potassium: 4.4 mmol/L (ref 3.5–5.1)
Sodium: 140 mmol/L (ref 135–145)

## 2023-08-29 LAB — SURGICAL PCR SCREEN
MRSA, PCR: NEGATIVE
Staphylococcus aureus: NEGATIVE

## 2023-08-29 MED ORDER — ASPIRIN-ACETAMINOPHEN-CAFFEINE 250-250-65 MG PO TABS
1.0000 | ORAL_TABLET | Freq: Four times a day (QID) | ORAL | Status: DC | PRN
  Administered 2023-08-29: 1 via ORAL
  Filled 2023-08-29 (×2): qty 1

## 2023-08-29 MED ORDER — MORPHINE SULFATE (PF) 2 MG/ML IV SOLN: 2.0000 mg | INTRAVENOUS | Status: DC | PRN

## 2023-08-29 MED ORDER — OXYCODONE HCL 5 MG PO TABS
5.0000 mg | ORAL_TABLET | Freq: Four times a day (QID) | ORAL | Status: DC | PRN
  Filled 2023-08-29: qty 1

## 2023-08-29 MED ORDER — SALINE SPRAY 0.65 % NA SOLN
1.0000 | NASAL | Status: DC | PRN
  Administered 2023-08-29: 1 via NASAL
  Filled 2023-08-29: qty 44

## 2023-08-29 MED ORDER — PREDNISOLON-MOXIFLOX-BROMFENAC 1-0.5-0.075 % OP SOLN
1.0000 [drp] | Freq: Four times a day (QID) | OPHTHALMIC | Status: DC
  Administered 2023-08-29 – 2023-08-30 (×6): 1 [drp] via OPHTHALMIC
  Filled 2023-08-29 (×2): qty 1

## 2023-08-29 NOTE — Consult Note (Signed)
Monica White 24-Nov-1941  [id number].    Requesting MD: Chaney Malling Chief Complaint/Reason for Consult: nausea  HPI:  82 yo female with complex foregut history. She underwent hiatal hernia repair 02/2022 with early recurrence and presented acutely 10/2022 with attempted reduction and placement of g tube. She also had a volvulus of the stomach in 01/2023 and underwent EGD and replacement of G tube. She has had nausea and epigastric pain today and presented early to the ED. She denies vomiting. Pain has been constant and she is very nervous about her symptoms.  ROS: Review of Systems  Constitutional: Negative.   HENT: Negative.    Eyes: Negative.   Respiratory: Negative.    Cardiovascular: Negative.   Gastrointestinal:  Positive for abdominal pain and nausea.  Genitourinary: Negative.   Musculoskeletal: Negative.   Skin: Negative.   Neurological: Negative.   Endo/Heme/Allergies: Negative.   Psychiatric/Behavioral: Negative.      Family History  Problem Relation Age of Onset   Heart disease Mother     Past Medical History:  Diagnosis Date   Anemia    Arthritis    Dyspnea    GERD (gastroesophageal reflux disease)    History of hiatal hernia    Hypercholesteremia    Hypothyroid 2015   PONV (postoperative nausea and vomiting)     Past Surgical History:  Procedure Laterality Date   COLONOSCOPY     HIATAL HERNIA REPAIR N/A 03/12/2022   Procedure: LAPAROSCOPIC REPAIR OF HIATAL HERNIA;  Surgeon: Rodman Pickle, MD;  Location: [facility];  Service: General;  Laterality: N/A;   IR GASTROSTOMY TUBE REMOVAL  02/15/2023   IR REPLC GASTRO/COLONIC TUBE PERCUT W/FLUORO  03/06/2023   RIGHT HEART CATH AND CORONARY ANGIOGRAPHY N/A 12/01/2021   Procedure: RIGHT HEART CATH AND CORONARY ANGIOGRAPHY;  Surgeon: Tonny Bollman, MD;  Location: [facility] INVASIVE CV LAB;  Service: Cardiovascular;  Laterality: N/A;   TUBAL LIGATION  11/27/1983   UPPER GASTROINTESTINAL ENDOSCOPY     UPPER GI  ENDOSCOPY N/A 02/09/2023   Procedure: UPPER GI ENDOSCOPY; DECOMPRESSION OF GASTRIC VOLVULUS; LAPAROSCOPIC GASTROSTOMY TUBE PLACEMENT;  Surgeon: Quentin Ore, MD;  Location: [facility];  Service: General;  Laterality: N/A;   XI ROBOTIC ASSISTED HIATAL HERNIA REPAIR N/A 11/06/2022   Procedure: XI ROBOTIC REDUCTIONOF INCARCERATED HIATAL HERNIA REPAIR, LAPAROSCOPIC G-TUBE INSERTION, EGD;  Surgeon: Nikole Swartzentruber, De Blanch, MD;  Location: [facility];  Service: General;  Laterality: N/A;    Social History:  reports that she has never smoked. She has never used smokeless tobacco. She reports that she does not currently use alcohol. She reports that she does not use drugs.  Allergies:  Allergies  Allergen Reactions   Atorvastatin Other (See Comments)    "Stiffness"   Dilaudid [Hydromorphone] Nausea And Vomiting and Other (See Comments)    Hot flashes, too   Escitalopram Other (See Comments)    "Stiffness"   Pitavastatin Other (See Comments)    Stiffness   Tramadol Other (See Comments)    "Felt badly and developed a headache"    Medications Prior to Admission  Medication Sig Dispense Refill   acetaminophen (TYLENOL) 325 MG tablet Take 2 tablets (650 mg total) by mouth every 6 (six) hours as needed. (Patient taking differently: Take 650 mg by mouth every 6 (six) hours as needed for mild pain or headache.)     Ascorbic Acid (VITAMIN C PO) Take 1 tablet by mouth daily.     cholecalciferol (VITAMIN D3) 25 MCG (1000 UNIT)  tablet Take 1,000 Units by mouth daily.     Cyanocobalamin (B-12) 1000 MCG CAPS Take 1,000 mcg by mouth daily.     desvenlafaxine (PRISTIQ) 25 MG 24 hr tablet Take 25 mg by mouth in the morning.     estradiol (ESTRACE) 0.5 MG tablet Take 0.5 mg by mouth daily.     levothyroxine (SYNTHROID) 75 MCG tablet Take 75 mcg by mouth daily before breakfast.     medroxyPROGESTERone (PROVERA) 2.5 MG tablet Take 1 tablet (2.5 mg total) by mouth daily. 90 tablet 3   Multiple Vitamins-Minerals  (MULTIVITAMIN PO) Take 1 tablet by mouth daily with breakfast.     Prednisolon-Moxiflox-Bromfenac 1-0.5-0.075 % SOLN Place 1 drop into the right eye 4 (four) times daily.     Probiotic Product (PROBIOTIC PO) Take 1 capsule by mouth in the morning.     ondansetron (ZOFRAN-ODT) 4 MG disintegrating tablet Take 1 tablet (4 mg total) by mouth every 6 (six) hours as needed for nausea. (Patient not taking: Reported on 08/28/2023) 30 tablet 3   oxyCODONE (OXY IR/ROXICODONE) 5 MG immediate release tablet Take 1 tablet (5 mg total) by mouth every 6 (six) hours as needed for breakthrough pain. (Patient not taking: Reported on 08/28/2023) 15 tablet 0   pantoprazole (PROTONIX) 40 MG tablet Take 1 tablet (40 mg total) by mouth 2 (two) times daily. (Patient not taking: Reported on 08/28/2023) 60 tablet 0    Physical Exam: Blood pressure 114/82, pulse 72, temperature 97.8 F (36.6 C), temperature source Oral, resp. rate 18, height 5\' 7"  (1.702 m), weight 59 kg, last menstrual period 01/13/1994, SpO2 93%. Gen: NAD Resp: nonlabored CV: RRR Abd: soft, NT, ND Neuro: AOx4  Results for orders placed or performed during the hospital encounter of 08/28/23 (from the past 48 hour(s))  Lipase, blood     Status: None   Collection Time: 08/28/23 10:45 AM  Result Value Ref Range   Lipase 30 11 - 51 U/L    Comment: Performed at [facility], [address] [place], [place] [address]  Comprehensive metabolic panel     Status: Abnormal   Collection Time: 08/28/23 10:45 AM  Result Value Ref Range   Sodium 138 135 - 145 mmol/L   Potassium 3.9 3.5 - 5.1 mmol/L   Chloride 103 98 - 111 mmol/L   CO2 27 22 - 32 mmol/L   Glucose, Bld 107 (H) 70 - 99 mg/dL    Comment: Glucose reference range applies only to samples taken after fasting for at least 8 hours.   BUN 21 8 - 23 mg/dL   Creatinine, Ser 8.75 0.44 - 1.00 mg/dL   Calcium 9.4 8.9 - 64.3 mg/dL   Total Protein 6.8 6.5 - 8.1 g/dL   Albumin 4.3 3.5 -  5.0 g/dL   AST 20 15 - 41 U/L   ALT 20 0 - 44 U/L   Alkaline Phosphatase 59 38 - 126 U/L   Total Bilirubin 0.6 0.3 - 1.2 mg/dL   GFR, Estimated >32 >95 mL/min    Comment: (NOTE) Calculated using the CKD-EPI Creatinine Equation (2021)    Anion gap 8 5 - 15    Comment: Performed at [facility], [address] [place], [place] [address]  CBC     Status: None   Collection Time: 08/28/23 10:45 AM  Result Value Ref Range   WBC 8.7 4.0 - 10.5 K/uL   RBC 4.70 3.87 - 5.11 MIL/uL   Hemoglobin 14.5 12.0 -  15.0 g/dL   HCT 16.1 09.6 - 04.5 %   MCV 95.7 80.0 - 100.0 fL   MCH 30.9 26.0 - 34.0 pg   MCHC 32.2 30.0 - 36.0 g/dL   RDW 40.9 81.1 - 91.4 %   Platelets 320 150 - 400 K/uL   nRBC 0.0 0.0 - 0.2 %    Comment: Performed at [facility], [address], [place], [place] [address]  Urinalysis, Routine w reflex microscopic -Urine, Clean Catch     Status: Abnormal   Collection Time: 08/28/23 10:45 AM  Result Value Ref Range   Color, Urine YELLOW YELLOW   APPearance CLEAR CLEAR   Specific Gravity, Urine 1.029 1.005 - 1.030   pH 5.0 5.0 - 8.0   Glucose, UA NEGATIVE NEGATIVE mg/dL   Hgb urine dipstick NEGATIVE NEGATIVE   Bilirubin Urine NEGATIVE NEGATIVE   Ketones, ur NEGATIVE NEGATIVE mg/dL   Protein, ur NEGATIVE NEGATIVE mg/dL   Nitrite NEGATIVE NEGATIVE   Leukocytes,Ua TRACE (A) NEGATIVE   RBC / HPF 0-5 0 - 5 RBC/hpf   WBC, UA 0-5 0 - 5 WBC/hpf   Bacteria, UA NONE SEEN NONE SEEN   Squamous Epithelial / HPF 0-5 0 - 5 /HPF   Mucus PRESENT     Comment: Performed at [facility], [address] [place], [place] [address]  Troponin I (High Sensitivity)     Status: None   Collection Time: 08/28/23  4:34 PM  Result Value Ref Range   Troponin I (High Sensitivity) 6 <18 ng/L    Comment: (NOTE) Elevated high sensitivity troponin I (hsTnI) values and significant  changes across serial measurements may suggest ACS but many other   chronic and acute conditions are known to elevate hsTnI results.  Refer to the "Links" section for chest pain algorithms and additional  guidance. Performed at [facility], [address] [place], [place] [address]   Surgical PCR screen     Status: None   Collection Time: 08/29/23  3:44 AM   Specimen: Nasal Mucosa; Nasal Swab  Result Value Ref Range   MRSA, PCR NEGATIVE NEGATIVE   Staphylococcus aureus NEGATIVE NEGATIVE    Comment: (NOTE) The Xpert SA Assay (FDA approved for NASAL specimens in patients 82 years of age and older), is one component of a comprehensive surveillance program. It is not intended to diagnose infection nor to guide or monitor treatment. Performed at [facility], [address], [place], [place] [address]   Basic metabolic panel     Status: Abnormal   Collection Time: 08/29/23  5:02 AM  Result Value Ref Range   Sodium 140 135 - 145 mmol/L   Potassium 4.4 3.5 - 5.1 mmol/L   Chloride 105 98 - 111 mmol/L   CO2 22 22 - 32 mmol/L   Glucose, Bld 120 (H) 70 - 99 mg/dL    Comment: Glucose reference range applies only to samples taken after fasting for at least 8 hours.   BUN 14 8 - 23 mg/dL   Creatinine, Ser 9.52 0.44 - 1.00 mg/dL   Calcium 8.6 (L) 8.9 - 10.3 mg/dL   GFR, Estimated >84 >13 mL/min    Comment: (NOTE) Calculated using the CKD-EPI Creatinine Equation (2021)    Anion gap 13 5 - 15    Comment: Performed at [facility], [address] [place], [place] [address]  CBC     Status: None   Collection Time: 08/29/23  5:02 AM  Result Value Ref Range   WBC 7.9 4.0 - 10.5 K/uL   RBC 4.34 3.87 - 5.11 MIL/uL   Hemoglobin 13.5 12.0 - 15.0 g/dL   HCT 16.1 09.6 - 04.5 %   MCV 94.7 80.0 - 100.0 fL   MCH 31.1 26.0 - 34.0 pg   MCHC 32.8 30.0 - 36.0 g/dL   RDW 40.9 81.1 - 91.4 %   Platelets 287 150 - 400 K/uL   nRBC 0.0 0.0 - 0.2 %    Comment: Performed at [facility], [address] [place], [place] [address]    CT CHEST ABDOMEN PELVIS W CONTRAST  Result Date: 08/28/2023 CLINICAL DATA:  Sepsis, abdominal pain, history of hiatal hernia with multiple complications. Difficulty breathing and nausea EXAM: CT CHEST, ABDOMEN, AND PELVIS WITH CONTRAST TECHNIQUE: Multidetector CT imaging of the chest, abdomen and pelvis was performed following the standard protocol during bolus administration of intravenous contrast. RADIATION DOSE REDUCTION: This exam was performed according to the departmental dose-optimization program which includes automated exposure control, adjustment of the mA and/or kV according to patient size and/or use of iterative reconstruction technique. CONTRAST:  OMNIPAQUE IOHEXOL 300 MG/ML  SOLN COMPARISON:  CT abdomen and pelvis 03/11/2023 and CT chest abdomen and pelvis 02/15/2023 FINDINGS: CT CHEST FINDINGS Cardiovascular: Coronary artery and aortic atherosclerotic calcification. No pericardial effusion. Mediastinum/Nodes: Trachea is unremarkable. No thoracic adenopathy. Large sliding-type hiatal hernia with most of the stomach in the chest. Lungs/Pleura: Compressive atelectasis in the left upper and lower lobe secondary to the large hiatal and diaphragmatic hernias. Ground-glass opacities are likely due to atelectasis. Small left pleural effusion. The right lung is clear. No pneumothorax. Musculoskeletal: No acute fracture. CT ABDOMEN PELVIS FINDINGS Hepatobiliary: Scattered hepatic cysts. Hepatic steatosis. Gallbladder and biliary tree are unremarkable. Pancreas: Unremarkable. Spleen: Unremarkable. Adrenals/Urinary Tract: Stable adrenal glands and kidneys. No obstructing urinary calculi or hydronephrosis. Question urothelial thickening in the proximal right ureter (series 8/image 67) with mild right pelviectasis, increased from 11/10/2023. No evidence of obstruction or filling defects on delayed images. Unremarkable bladder. Stomach/Bowel: Large hiatal hernia containing most of the stomach in the  transverse colon within the chest. New additional left diaphragmatic hernia containing multiple jejunal loops. No evidence of obstruction. No bowel wall thickening. Mobile cecum in the central abdomen. Normal appendix. Colonic diverticulosis without diverticulitis. The previous gastrostomy tube has been removed. Vascular/Lymphatic: Aortic atherosclerosis. No enlarged abdominal or pelvic lymph nodes. Reproductive: Unchanged 3.4 cm left adnexal cyst.  Fibroid uterus. Other: Low-density free fluid within the hiatal and diaphragmatic hernia sacs in the left upper quadrant. No free intraperitoneal air. Musculoskeletal: Left convex thoracolumbar curve. Degenerative spondylosis in the thoracolumbar spine. No acute fracture. IMPRESSION: 1. New left diaphragmatic hernia containing multiple jejunal loops. No evidence of obstruction. 2. Large hiatal hernia containing most of the stomach and transverse colon within the chest. Small left pleural effusion. Compressive atelectasis in the left upper and lower lobes. 3. Question urothelial thickening in the proximal right ureter with new mild right pelviectasis. No evidence of filling defects on delayed images however urothelial malignancy is not excluded. Consider urology referral. 4. Colonic diverticulosis without diverticulitis. Aortic Atherosclerosis (ICD10-I70.0). Electronically Signed   By: Minerva Fester M.D.   On: 08/28/2023 19:39    Assessment/Plan 82 yo with complex foregut history with recurrent hiatal hernia with new nausea and abdominal pain -CT scan pending -I think she will likely need to be admitted for rehydration and bowel rest -I discussed options for  her currently and mainly decompression by NG or replacement of g tube -We discussed briefly referral to [facility] for further evaluation of treatment of options of this complex disease once she is over this episode   FEN - NPO VTE - ok for prophylaxis ID - no issues Admit - inpatient medicine admit  I  reviewed last 24 h vitals and pain scores, last 48 h intake and output, last 24 h labs and trends, and last 24 h imaging results.  De Blanch [facility] 08/29/2023, 8:26 AM Please see Amion for pager number during day hours 7:00am-4:30pm or 7:00am -11:30am on weekends

## 2023-08-29 NOTE — Progress Notes (Addendum)
PROGRESS NOTE  Monica White  ZOX:[id number] DOB: Aug 30, 1941 DOA: 08/28/2023 PCP: Rodrigo Ran, MD   Brief Narrative: Patient is a 82 year old female with history of hypothyroidism, hyperlipidemia, hiatal hernia who presented with abdomen pain, shortness of breath.  She recently  had cataract surgery.  Developed nausea, abdominal pain prompting her to ER.  On presentation she was hemodynamically stable.  CT imaging demonstrated large diaphragmatic hernia containing multiple loops of jejunum and a large hiatal hernia containing most of the stomach and transverse colon within the chest as well as right proximal ureteral thickening and right pelvictasis.  General surgery consulted and following.  Assessment & Plan:  Principal Problem:   Diaphragmatic hernia Active Problems:   Hiatal hernia   Renal pelviectasis   Hypothyroid  Paraesophageal/diaphragmatic hernia: Imaging as above.  Presented with abdomen pain, nausea, vomiting.  General surgery following.  Currently n.p.o.  Hypothyroidism: Currently on IV levothyroxine  Right renal pelviectasis:  CT showed urothelial thickening in the proximal right ureter withnew mild right pelviectasis. No evidence of filling defects on however urothelial malignancy is not excluded. We recommend outpatient follow-up with urology  Recent cataract surgery:continue eyedrops  Addendum:  As per general surgery recommendation,. I have consulted cardiothoracic surgery,case discussed with PA Roddenberry. Family anxious to hear plan from surgical team,I have sent this message to general surgery team.       DVT prophylaxis:enoxaparin (LOVENOX) injection 40 mg Start: 08/28/23 2200     Code Status: Full Code  Family Communication: None at bedside  Patient status:Inpatient  Patient is from :home  Anticipated discharge WJ:XBJY  Estimated DC date:after general surgery clearance   Consultants: General surgery  Procedures:None yet  Antimicrobials:   Anti-infectives (From admission, onward)    None       Subjective: Patient seen and examined the bedside today.  Complains of some headache.  Denies any abdomen pain, nausea or vomiting this morning.  Had a bowel movement yesterday.  Objective: Vitals:   08/28/23 2051 08/28/23 2251 08/28/23 2314 08/29/23 0309  BP: (!) 142/88 (!) 146/75 131/87 114/82  Pulse: 78  68 72  Resp: 18  18 18   Temp: 98 F (36.7 C)  (!) 97.5 F (36.4 C) 97.8 F (36.6 C)  TempSrc: Oral  Oral Oral  SpO2: 94%  92% 93%  Weight:   59 kg   Height:   5\' 7"  (1.702 m)     Intake/Output Summary (Last 24 hours) at 08/29/2023 0804 Last data filed at 08/28/2023 2321 Gross per 24 hour  Intake 62.58 ml  Output --  Net 62.58 ml   Filed Weights   08/28/23 1031 08/28/23 2314  Weight: 55.3 kg 59 kg    Examination:  General exam: Overall comfortable, not in distress HEENT: PERRL Respiratory system:  no wheezes or crackles  Cardiovascular system: S1 & S2 heard, RRR.  Gastrointestinal system: Abdomen is nondistended, soft and nontender.  Bowel sounds present Central nervous system: Alert and oriented Extremities: No edema, no clubbing ,no cyanosis Skin: No rashes, no ulcers,no icterus     Data Reviewed: I have personally reviewed following labs and imaging studies  CBC: Recent Labs  Lab 08/28/23 1045 08/29/23 0502  WBC 8.7 7.9  HGB 14.5 13.5  HCT 45.0 41.1  MCV 95.7 94.7  PLT 320 287   Basic Metabolic Panel: Recent Labs  Lab 08/28/23 1045 08/29/23 0502  NA 138 140  K 3.9 4.4  CL 103 105  CO2 27 22  GLUCOSE 107* 120*  BUN 21  14  CREATININE 0.75 0.67  CALCIUM 9.4 8.6*     Recent Results (from the past 240 hour(s))  Surgical PCR screen     Status: None   Collection Time: 08/29/23  3:44 AM   Specimen: Nasal Mucosa; Nasal Swab  Result Value Ref Range Status   MRSA, PCR NEGATIVE NEGATIVE Final   Staphylococcus aureus NEGATIVE NEGATIVE Final    Comment: (NOTE) The Xpert SA Assay (FDA  approved for NASAL specimens in patients 76 years of age and older), is one component of a comprehensive surveillance program. It is not intended to diagnose infection nor to guide or monitor treatment. Performed at [facility], [address] [place], [place] [address]      Radiology Studies: CT CHEST ABDOMEN PELVIS W CONTRAST  Result Date: 08/28/2023 CLINICAL DATA:  Sepsis, abdominal pain, history of hiatal hernia with multiple complications. Difficulty breathing and nausea EXAM: CT CHEST, ABDOMEN, AND PELVIS WITH CONTRAST TECHNIQUE: Multidetector CT imaging of the chest, abdomen and pelvis was performed following the standard protocol during bolus administration of intravenous contrast. RADIATION DOSE REDUCTION: This exam was performed according to the departmental dose-optimization program which includes automated exposure control, adjustment of the mA and/or kV according to patient size and/or use of iterative reconstruction technique. CONTRAST:  OMNIPAQUE IOHEXOL 300 MG/ML  SOLN COMPARISON:  CT abdomen and pelvis 03/11/2023 and CT chest abdomen and pelvis 02/15/2023 FINDINGS: CT CHEST FINDINGS Cardiovascular: Coronary artery and aortic atherosclerotic calcification. No pericardial effusion. Mediastinum/Nodes: Trachea is unremarkable. No thoracic adenopathy. Large sliding-type hiatal hernia with most of the stomach in the chest. Lungs/Pleura: Compressive atelectasis in the left upper and lower lobe secondary to the large hiatal and diaphragmatic hernias. Ground-glass opacities are likely due to atelectasis. Small left pleural effusion. The right lung is clear. No pneumothorax. Musculoskeletal: No acute fracture. CT ABDOMEN PELVIS FINDINGS Hepatobiliary: Scattered hepatic cysts. Hepatic steatosis. Gallbladder and biliary tree are unremarkable. Pancreas: Unremarkable. Spleen: Unremarkable. Adrenals/Urinary Tract: Stable adrenal glands and kidneys. No obstructing urinary calculi or  hydronephrosis. Question urothelial thickening in the proximal right ureter (series 8/image 67) with mild right pelviectasis, increased from 11/10/2023. No evidence of obstruction or filling defects on delayed images. Unremarkable bladder. Stomach/Bowel: Large hiatal hernia containing most of the stomach in the transverse colon within the chest. New additional left diaphragmatic hernia containing multiple jejunal loops. No evidence of obstruction. No bowel wall thickening. Mobile cecum in the central abdomen. Normal appendix. Colonic diverticulosis without diverticulitis. The previous gastrostomy tube has been removed. Vascular/Lymphatic: Aortic atherosclerosis. No enlarged abdominal or pelvic lymph nodes. Reproductive: Unchanged 3.4 cm left adnexal cyst.  Fibroid uterus. Other: Low-density free fluid within the hiatal and diaphragmatic hernia sacs in the left upper quadrant. No free intraperitoneal air. Musculoskeletal: Left convex thoracolumbar curve. Degenerative spondylosis in the thoracolumbar spine. No acute fracture. IMPRESSION: 1. New left diaphragmatic hernia containing multiple jejunal loops. No evidence of obstruction. 2. Large hiatal hernia containing most of the stomach and transverse colon within the chest. Small left pleural effusion. Compressive atelectasis in the left upper and lower lobes. 3. Question urothelial thickening in the proximal right ureter with new mild right pelviectasis. No evidence of filling defects on delayed images however urothelial malignancy is not excluded. Consider urology referral. 4. Colonic diverticulosis without diverticulitis. Aortic Atherosclerosis (ICD10-I70.0). Electronically Signed   By: Minerva Fester M.D.   On: 08/28/2023 19:39    Scheduled Meds:  enoxaparin (LOVENOX) injection  40 mg Subcutaneous Q24H   pantoprazole (PROTONIX) IV  40 mg Intravenous Q24H   zolpidem  5 mg Oral Once   Continuous Infusions:  lactated ringers 75 mL/hr at 08/28/23 2321    promethazine (PHENERGAN) injection (IM or IVPB) 12.5 mg (08/29/23 0323)     LOS: 1 day   Burnadette Pop, MD [facility] P10/01/2023, 8:04 AM

## 2023-08-29 NOTE — Progress Notes (Addendum)
Progress Note     Subjective: Still having some abdominal pain and nausea. Emesis overnight after taking medications but none today. She thinks the emesis may have been related to dilaudid. Pain meds helping some. Nausea improved with zofran. Last BM Tuesday morning. Has been oob today and some shob with movement/walking  Last food intake Tuesday night   Objective: Vital signs in last 24 hours: Temp:  [97.5 F (36.4 C)-98.3 F (36.8 C)] 97.7 F (36.5 C) (10/03 1544) Pulse Rate:  [66-78] 68 (10/03 1544) Resp:  [15-18] 15 (10/03 1544) BP: (112-182)/(69-89) 112/69 (10/03 1544) SpO2:  [90 %-100 %] 94 % (10/03 1544) Weight:  [59 kg] 59 kg (10/02 2314) Last BM Date : 08/27/23  Intake/Output from previous day: 10/02 0701 - 10/03 0700 In: 62.6 [I.V.:62.6] Out: -  Intake/Output this shift: Total I/O In: 1103.3 [I.V.:1003.3; IV Piggyback:100] Out: -   PE: General: pleasant, WD, female who is laying in bed in NAD HEENT: head is normocephalic, atraumatic.  Sclera are noninjected.  Pupils equal and round. EOMs intact.  Ears and nose without any masses or lesions.  Mouth is pink and moist Heart: regular, rate, and rhythm Lungs: CTAB. LLL lung sounds diminished Abd: soft, NT, ND, +BS, no masses, hernias, or organomegaly MSK: all 4 extremities are symmetrical with no cyanosis, clubbing, or edema. Skin: warm and dry Psych: A&Ox3 with an appropriate affect.    Lab Results:  Recent Labs    08/28/23 1045 08/29/23 0502  WBC 8.7 7.9  HGB 14.5 13.5  HCT 45.0 41.1  PLT 320 287   BMET Recent Labs    08/28/23 1045 08/29/23 0502  NA 138 140  K 3.9 4.4  CL 103 105  CO2 27 22  GLUCOSE 107* 120*  BUN 21 14  CREATININE 0.75 0.67  CALCIUM 9.4 8.6*   PT/INR No results for input(s): "LABPROT", "INR" in the last 72 hours. CMP     Component Value Date/Time   NA 140 08/29/2023 0502   NA 142 11/29/2021 1048   K 4.4 08/29/2023 0502   CL 105 08/29/2023 0502   CO2 22 08/29/2023  0502   GLUCOSE 120 (H) 08/29/2023 0502   BUN 14 08/29/2023 0502   BUN 21 11/29/2021 1048   CREATININE 0.67 08/29/2023 0502   CALCIUM 8.6 (L) 08/29/2023 0502   PROT 6.8 08/28/2023 1045   ALBUMIN 4.3 08/28/2023 1045   AST 20 08/28/2023 1045   ALT 20 08/28/2023 1045   ALKPHOS 59 08/28/2023 1045   BILITOT 0.6 08/28/2023 1045   GFRNONAA >60 08/29/2023 0502   GFRAA >60 02/17/2018 1123   Lipase     Component Value Date/Time   LIPASE 30 08/28/2023 1045       Studies/Results: CT CHEST ABDOMEN PELVIS W CONTRAST  Result Date: 08/28/2023 CLINICAL DATA:  Sepsis, abdominal pain, history of hiatal hernia with multiple complications. Difficulty breathing and nausea EXAM: CT CHEST, ABDOMEN, AND PELVIS WITH CONTRAST TECHNIQUE: Multidetector CT imaging of the chest, abdomen and pelvis was performed following the standard protocol during bolus administration of intravenous contrast. RADIATION DOSE REDUCTION: This exam was performed according to the departmental dose-optimization program which includes automated exposure control, adjustment of the mA and/or kV according to patient size and/or use of iterative reconstruction technique. CONTRAST:  OMNIPAQUE IOHEXOL 300 MG/ML  SOLN COMPARISON:  CT abdomen and pelvis 03/11/2023 and CT chest abdomen and pelvis 02/15/2023 FINDINGS: CT CHEST FINDINGS Cardiovascular: Coronary artery and aortic atherosclerotic calcification. No pericardial effusion. Mediastinum/Nodes:  Trachea is unremarkable. No thoracic adenopathy. Large sliding-type hiatal hernia with most of the stomach in the chest. Lungs/Pleura: Compressive atelectasis in the left upper and lower lobe secondary to the large hiatal and diaphragmatic hernias. Ground-glass opacities are likely due to atelectasis. Small left pleural effusion. The right lung is clear. No pneumothorax. Musculoskeletal: No acute fracture. CT ABDOMEN PELVIS FINDINGS Hepatobiliary: Scattered hepatic cysts. Hepatic steatosis.  Gallbladder and biliary tree are unremarkable. Pancreas: Unremarkable. Spleen: Unremarkable. Adrenals/Urinary Tract: Stable adrenal glands and kidneys. No obstructing urinary calculi or hydronephrosis. Question urothelial thickening in the proximal right ureter (series 8/image 67) with mild right pelviectasis, increased from 11/10/2023. No evidence of obstruction or filling defects on delayed images. Unremarkable bladder. Stomach/Bowel: Large hiatal hernia containing most of the stomach in the transverse colon within the chest. New additional left diaphragmatic hernia containing multiple jejunal loops. No evidence of obstruction. No bowel wall thickening. Mobile cecum in the central abdomen. Normal appendix. Colonic diverticulosis without diverticulitis. The previous gastrostomy tube has been removed. Vascular/Lymphatic: Aortic atherosclerosis. No enlarged abdominal or pelvic lymph nodes. Reproductive: Unchanged 3.4 cm left adnexal cyst.  Fibroid uterus. Other: Low-density free fluid within the hiatal and diaphragmatic hernia sacs in the left upper quadrant. No free intraperitoneal air. Musculoskeletal: Left convex thoracolumbar curve. Degenerative spondylosis in the thoracolumbar spine. No acute fracture. IMPRESSION: 1. New left diaphragmatic hernia containing multiple jejunal loops. No evidence of obstruction. 2. Large hiatal hernia containing most of the stomach and transverse colon within the chest. Small left pleural effusion. Compressive atelectasis in the left upper and lower lobes. 3. Question urothelial thickening in the proximal right ureter with new mild right pelviectasis. No evidence of filling defects on delayed images however urothelial malignancy is not excluded. Consider urology referral. 4. Colonic diverticulosis without diverticulitis. Aortic Atherosclerosis (ICD10-I70.0). Electronically Signed   By: Minerva Fester M.D.   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 Patient likely needs operative intervention either here or at tertiary care facility. Urgent/emergent intervention not needed at this time   FEN: Npo, chips, IVF ID: none VTE: [email or web address]  I spoke with her son [employer] on the phone  I reviewed hospitalist notes, last 24 h vitals and pain scores, last 48 h intake and output, last 24 h labs and trends, and last 24 h imaging results.    LOS: 1 day   Eric Form, [facility] 08/29/2023, 4:01 PM Please see Amion for pager number during day hours 7:00am-4:30pm

## 2023-08-30 DIAGNOSIS — K449 Diaphragmatic hernia without obstruction or gangrene: Secondary | ICD-10-CM | POA: Diagnosis not present

## 2023-08-30 LAB — CBC
HCT: 36.3 % (ref 36.0–46.0)
Hemoglobin: 11.9 g/dL — ABNORMAL LOW (ref 12.0–15.0)
MCH: 31 pg (ref 26.0–34.0)
MCHC: 32.8 g/dL (ref 30.0–36.0)
MCV: 94.5 fL (ref 80.0–100.0)
Platelets: 261 10*3/uL (ref 150–400)
RBC: 3.84 MIL/uL — ABNORMAL LOW (ref 3.87–5.11)
RDW: 13.3 % (ref 11.5–15.5)
WBC: 5.5 10*3/uL (ref 4.0–10.5)
nRBC: 0 % (ref 0.0–0.2)

## 2023-08-30 LAB — BASIC METABOLIC PANEL
Anion gap: 7 (ref 5–15)
BUN: 9 mg/dL (ref 8–23)
CO2: 28 mmol/L (ref 22–32)
Calcium: 7.8 mg/dL — ABNORMAL LOW (ref 8.9–10.3)
Chloride: 105 mmol/L (ref 98–111)
Creatinine, Ser: 0.75 mg/dL (ref 0.44–1.00)
GFR, Estimated: >60 mL/min (ref >60)
Glucose, Bld: 98 mg/dL (ref 70–99)
Potassium: 3.8 mmol/L (ref 3.5–5.1)
Sodium: 140 mmol/L (ref 135–145)

## 2023-08-30 MED ORDER — ACETAMINOPHEN 325 MG PO TABS: 650.0000 mg | ORAL_TABLET | Freq: Four times a day (QID) | ORAL | Status: DC | PRN

## 2023-08-30 MED ORDER — ENSURE ENLIVE PO LIQD: 237.0000 mL | Freq: Two times a day (BID) | ORAL | Status: DC

## 2023-08-30 MED ORDER — BOOST / RESOURCE BREEZE PO LIQD CUSTOM
1.0000 | Freq: Three times a day (TID) | ORAL | Status: DC
  Administered 2023-08-30 (×2): 1 via ORAL

## 2023-08-30 MED ORDER — LEVOTHYROXINE SODIUM 75 MCG PO TABS
75.0000 ug | ORAL_TABLET | Freq: Every day | ORAL | Status: DC
  Administered 2023-08-30: 75 ug via ORAL
  Filled 2023-08-30: qty 1

## 2023-08-30 NOTE — Progress Notes (Addendum)
Subjective: CC: Patient reports Tuesday night she had a salad and on Wed awoke with pain and nausea.   She reports her abdominal pain has resolved. No n/v since being started on cld. Tolerating cld well. Passing flatus. No BM.   Objective: Vital signs in last 24 hours: Temp:  [97.7 F (36.5 C)-98.4 F (36.9 C)] 98.4 F (36.9 C) (10/04 0515) Pulse Rate:  [62-90] 90 (10/04 0909) Resp:  [15-17] 17 (10/04 0909) BP: (106-126)/(63-71) 106/67 (10/04 0909) SpO2:  [90 %-94 %] 93 % (10/04 0909) Weight:  [59.6 kg] 59.6 kg (10/04 0515) Last BM Date : 08/27/23  Intake/Output from previous day: 10/03 0701 - 10/04 0700 In: 1343.3 [P.O.:240; I.V.:1003.3; IV Piggyback:100] Out: -  Intake/Output this shift: No intake/output data recorded.  PE: Gen:  Alert, NAD, pleasant Abd: Soft, ND, NT, +BS. Prior G-tube sites and surgical scars well healed.   Lab Results:  Recent Labs    08/29/23 0502 08/30/23 0415  WBC 7.9 5.5  HGB 13.5 11.9*  HCT 41.1 36.3  PLT 287 261   BMET Recent Labs    08/29/23 0502 08/30/23 0415  NA 140 140  K 4.4 3.8  CL 105 105  CO2 22 28  GLUCOSE 120* 98  BUN 14 9  CREATININE 0.67 0.75  CALCIUM 8.6* 7.8*   PT/INR No results for input(s): "LABPROT", "INR" in the last 72 hours. CMP     Component Value Date/Time   NA 140 08/30/2023 0415   NA 142 11/29/2021 1048   K 3.8 08/30/2023 0415   CL 105 08/30/2023 0415   CO2 28 08/30/2023 0415   GLUCOSE 98 08/30/2023 0415   BUN 9 08/30/2023 0415   BUN 21 11/29/2021 1048   CREATININE 0.75 08/30/2023 0415   CALCIUM 7.8 (L) 08/30/2023 0415   PROT 6.8 08/28/2023 1045   ALBUMIN 4.3 08/28/2023 1045   AST 20 08/28/2023 1045   ALT 20 08/28/2023 1045   ALKPHOS 59 08/28/2023 1045   BILITOT 0.6 08/28/2023 1045   GFRNONAA >60 08/30/2023 0415   GFRAA >60 02/17/2018 1123   Lipase     Component Value Date/Time   LIPASE 30 08/28/2023 1045    Studies/Results: CT CHEST ABDOMEN PELVIS W CONTRAST  Result  Date: 08/28/2023 CLINICAL DATA:  Sepsis, abdominal pain, history of hiatal hernia with multiple complications. Difficulty breathing and nausea EXAM: CT CHEST, ABDOMEN, AND PELVIS WITH CONTRAST TECHNIQUE: Multidetector CT imaging of the chest, abdomen and pelvis was performed following the standard protocol during bolus administration of intravenous contrast. RADIATION DOSE REDUCTION: This exam was performed according to the departmental dose-optimization program which includes automated exposure control, adjustment of the mA and/or kV according to patient size and/or use of iterative reconstruction technique. CONTRAST:  OMNIPAQUE IOHEXOL 300 MG/ML  SOLN COMPARISON:  CT abdomen and pelvis 03/11/2023 and CT chest abdomen and pelvis 02/15/2023 FINDINGS: CT CHEST FINDINGS Cardiovascular: Coronary artery and aortic atherosclerotic calcification. No pericardial effusion. Mediastinum/Nodes: Trachea is unremarkable. No thoracic adenopathy. Large sliding-type hiatal hernia with most of the stomach in the chest. Lungs/Pleura: Compressive atelectasis in the left upper and lower lobe secondary to the large hiatal and diaphragmatic hernias. Ground-glass opacities are likely due to atelectasis. Small left pleural effusion. The right lung is clear. No pneumothorax. Musculoskeletal: No acute fracture. CT ABDOMEN PELVIS FINDINGS Hepatobiliary: Scattered hepatic cysts. Hepatic steatosis. Gallbladder and biliary tree are unremarkable. Pancreas: Unremarkable. Spleen: Unremarkable. Adrenals/Urinary Tract: Stable adrenal glands and kidneys. No obstructing urinary calculi  or hydronephrosis. Question urothelial thickening in the proximal right ureter (series 8/image 67) with mild right pelviectasis, increased from 11/10/2023. No evidence of obstruction or filling defects on delayed images. Unremarkable bladder. Stomach/Bowel: Large hiatal hernia containing most of the stomach in the transverse colon within the chest. New additional  left diaphragmatic hernia containing multiple jejunal loops. No evidence of obstruction. No bowel wall thickening. Mobile cecum in the central abdomen. Normal appendix. Colonic diverticulosis without diverticulitis. The previous gastrostomy tube has been removed. Vascular/Lymphatic: Aortic atherosclerosis. No enlarged abdominal or pelvic lymph nodes. Reproductive: Unchanged 3.4 cm left adnexal cyst.  Fibroid uterus. Other: Low-density free fluid within the hiatal and diaphragmatic hernia sacs in the left upper quadrant. No free intraperitoneal air. Musculoskeletal: Left convex thoracolumbar curve. Degenerative spondylosis in the thoracolumbar spine. No acute fracture. IMPRESSION: 1. New left diaphragmatic hernia containing multiple jejunal loops. No evidence of obstruction. 2. Large hiatal hernia containing most of the stomach and transverse colon within the chest. Small left pleural effusion. Compressive atelectasis in the left upper and lower lobes. 3. Question urothelial thickening in the proximal right ureter with new mild right pelviectasis. No evidence of filling defects on delayed images however urothelial malignancy is not excluded. Consider urology referral. 4. Colonic diverticulosis without diverticulitis. Aortic Atherosclerosis (ICD10-I70.0). Electronically Signed   By: Minerva Fester M.D.   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 No evidence of obstruction. Case reviewed with TCTS here. No indication for emergency surgery at this time.  - Patients symptoms have resolved and she is tolerating cld. Will advance to fld. If she tolerates this will advanced to pureed diet with plan to remain on pureed diet + shakes  at discharge. Once tolerating a pureed diet she is okay for discharge home from our standpoint, possibly as early as later this afternoon - Patient and family would like referral to tertiary care facility at [facility]. Will have the office make a referral. - I spoke with her son Tripp on the phone   FEN: FLD, ADAT to pureed diet ID: none VTE: [email or web address]  I reviewed nursing notes, last 24 h vitals and pain scores, last 48 h intake and output, last 24 h labs and trends, and last 24 h imaging results.   LOS: 2 days    Jacinto Halim , [facility] Surgery 08/30/2023, 9:39 AM Please see Amion for pager number during day hours 7:00am-4:30pm

## 2023-08-30 NOTE — Discharge Summary (Signed)
Physician Discharge Summary  Monica White NFA:[id number] DOB: [birth year] DOA: 08/28/2023  PCP: Rodrigo Ran, MD  Admit date: 08/28/2023 Discharge date: 08/30/2023  Admitted From: Home Disposition:  Home  Discharge Condition:Stable CODE STATUS:FULL Diet recommendation: dysphagia 1 diet Brief/Interim Summary: Patient is a 82 year old female with history of hypothyroidism, hyperlipidemia, hiatal hernia who presented with abdomen pain, shortness of breath.  She recently  had cataract surgery.  Developed nausea, abdominal pain prompting her to ER.  On presentation she was hemodynamically stable.  CT imaging demonstrated large diaphragmatic hernia containing multiple loops of jejunum and a large hiatal hernia containing most of the stomach and transverse colon within the chest as well as right proximal ureteral thickening and right pelvictasis.  General surgery consulted and following .  No plan for intervention at present.  Diet has been advanced to dysphagia 1 and she has tolerated.  Plan is to continue dysphagia 1 diet at home.  General surgery will follow her as an outpatient and will most probably refer her to tertiary care facility at [facility].  Medically stable for discharge.  Following problems were addressed during the hospitalization:  Paraesophageal/diaphragmatic hernia: Imaging as above.  Presented with abdomen pain, nausea, vomiting.  General surgery was consulted.  These symptoms have resolved.  She will be on dysphagia 1 diet on discharge.  General surgery will follow-up as an outpatient, she will also follow-up with tertiary care center for the management of her hernias.  Hypothyroidism: Continue levothyroxine   Right renal pelviectasis:  CT showed urothelial thickening in the proximal right ureter withnew mild right pelviectasis. No evidence of filling defects on however urothelial malignancy is not excluded. We recommend outpatient follow-up with urology   Recent cataract surgery:continue  eyedrops   Discharge Diagnoses:  Principal Problem:   Diaphragmatic hernia Active Problems:   Hiatal hernia   Renal pelviectasis   Hypothyroid    Discharge Instructions  Discharge Instructions     Diet general   Complete by: As directed    Dysphagia 1 diet   Discharge instructions   Complete by: As directed    1)Please follow-up with general surgery as an outpatient 2)Continue Puree/Dysphagia 1 diet at home   Increase activity slowly   Complete by: As directed       Allergies as of 08/30/2023       Reactions   Atorvastatin Other (See Comments)   "Stiffness"   Dilaudid [hydromorphone] Nausea And Vomiting, Other (See Comments)   Hot flashes, too   Escitalopram Other (See Comments)   "Stiffness"   Pitavastatin Other (See Comments)   Stiffness   Tramadol Other (See Comments)   "Felt badly and developed a headache"        Medication List     TAKE these medications    acetaminophen 325 MG tablet Commonly known as: TYLENOL Take 2 tablets (650 mg total) by mouth every 6 (six) hours as needed. What changed: reasons to take this   B-12 1000 MCG Caps Take 1,000 mcg by mouth daily.   cholecalciferol 25 MCG (1000 UNIT) tablet Commonly known as: VITAMIN D3 Take 1,000 Units by mouth daily.   desvenlafaxine 25 MG 24 hr tablet Commonly known as: PRISTIQ Take 25 mg by mouth in the morning.   estradiol 0.5 MG tablet Commonly known as: ESTRACE Take 0.5 mg by mouth daily.   levothyroxine 75 MCG tablet Commonly known as: SYNTHROID Take 75 mcg by mouth daily before breakfast.   medroxyPROGESTERone 2.5 MG tablet Commonly known as: PROVERA Take  1 tablet (2.5 mg total) by mouth daily.   MULTIVITAMIN PO Take 1 tablet by mouth daily with breakfast.   ondansetron 4 MG disintegrating tablet Commonly known as: ZOFRAN-ODT Take 1 tablet (4 mg total) by mouth every 6 (six) hours as needed for nausea.   oxyCODONE 5 MG immediate release tablet Commonly known as: Oxy  IR/ROXICODONE Take 1 tablet (5 mg total) by mouth every 6 (six) hours as needed for breakthrough pain.   pantoprazole 40 MG tablet Commonly known as: PROTONIX Take 1 tablet (40 mg total) by mouth 2 (two) times daily.   Prednisolon-Moxiflox-Bromfenac 1-0.5-0.075 % Soln Place 1 drop into the right eye 4 (four) times daily.   PROBIOTIC PO Take 1 capsule by mouth in the morning.   VITAMIN C PO Take 1 tablet by mouth daily.        Follow-up Information     Stechschulte, Hyman Hopes, MD Follow up.   Specialty: Surgery Why: Our office is arranging you a follow up appointment. They should call you with the appointment date and time.  Our office is also making a referral to [facility] for evaluation Contact information: [address] [place] [address] [phone]                Allergies  Allergen Reactions   Atorvastatin Other (See Comments)    "Stiffness"   Dilaudid [Hydromorphone] Nausea And Vomiting and Other (See Comments)    Hot flashes, too   Escitalopram Other (See Comments)    "Stiffness"   Pitavastatin Other (See Comments)    Stiffness   Tramadol Other (See Comments)    "Felt badly and developed a headache"    Consultations: General surgery   Procedures/Studies: CT CHEST ABDOMEN PELVIS W CONTRAST  Result Date: 08/28/2023 CLINICAL DATA:  Sepsis, abdominal pain, history of hiatal hernia with multiple complications. Difficulty breathing and nausea EXAM: CT CHEST, ABDOMEN, AND PELVIS WITH CONTRAST TECHNIQUE: Multidetector CT imaging of the chest, abdomen and pelvis was performed following the standard protocol during bolus administration of intravenous contrast. RADIATION DOSE REDUCTION: This exam was performed according to the departmental dose-optimization program which includes automated exposure control, adjustment of the mA and/or kV according to patient size and/or use of iterative reconstruction technique. CONTRAST:   OMNIPAQUE IOHEXOL 300 MG/ML  SOLN COMPARISON:  CT abdomen and pelvis 03/11/2023 and CT chest abdomen and pelvis 02/15/2023 FINDINGS: CT CHEST FINDINGS Cardiovascular: Coronary artery and aortic atherosclerotic calcification. No pericardial effusion. Mediastinum/Nodes: Trachea is unremarkable. No thoracic adenopathy. Large sliding-type hiatal hernia with most of the stomach in the chest. Lungs/Pleura: Compressive atelectasis in the left upper and lower lobe secondary to the large hiatal and diaphragmatic hernias. Ground-glass opacities are likely due to atelectasis. Small left pleural effusion. The right lung is clear. No pneumothorax. Musculoskeletal: No acute fracture. CT ABDOMEN PELVIS FINDINGS Hepatobiliary: Scattered hepatic cysts. Hepatic steatosis. Gallbladder and biliary tree are unremarkable. Pancreas: Unremarkable. Spleen: Unremarkable. Adrenals/Urinary Tract: Stable adrenal glands and kidneys. No obstructing urinary calculi or hydronephrosis. Question urothelial thickening in the proximal right ureter (series 8/image 67) with mild right pelviectasis, increased from 11/10/2023. No evidence of obstruction or filling defects on delayed images. Unremarkable bladder. Stomach/Bowel: Large hiatal hernia containing most of the stomach in the transverse colon within the chest. New additional left diaphragmatic hernia containing multiple jejunal loops. No evidence of obstruction. No bowel wall thickening. Mobile cecum in the central abdomen. Normal appendix. Colonic diverticulosis without diverticulitis. The previous gastrostomy tube has  been removed. Vascular/Lymphatic: Aortic atherosclerosis. No enlarged abdominal or pelvic lymph nodes. Reproductive: Unchanged 3.4 cm left adnexal cyst.  Fibroid uterus. Other: Low-density free fluid within the hiatal and diaphragmatic hernia sacs in the left upper quadrant. No free intraperitoneal air. Musculoskeletal: Left convex thoracolumbar curve. Degenerative spondylosis in  the thoracolumbar spine. No acute fracture. IMPRESSION: 1. New left diaphragmatic hernia containing multiple jejunal loops. No evidence of obstruction. 2. Large hiatal hernia containing most of the stomach and transverse colon within the chest. Small left pleural effusion. Compressive atelectasis in the left upper and lower lobes. 3. Question urothelial thickening in the proximal right ureter with new mild right pelviectasis. No evidence of filling defects on delayed images however urothelial malignancy is not excluded. Consider urology referral. 4. Colonic diverticulosis without diverticulitis. Aortic Atherosclerosis (ICD10-I70.0). Electronically Signed   By: Minerva Fester M.D.   On: 08/28/2023 19:39      Subjective: Patient seen and examined the bedside this morning.  Hemodynamically stable.  She looks very comfortable today.  No nausea vomiting or abdominal pain.  She tolerated full liquid diet this morning.  Plan is to advance her diet to dysphagia 1 and discharge her home later today.  Discharge Exam: Vitals:   08/30/23 0515 08/30/23 0909  BP: 114/63 106/67  Pulse: 62 90  Resp: 17 17  Temp: 98.4 F (36.9 C)   SpO2: 91% 93%   Vitals:   08/29/23 0945 08/29/23 1544 08/30/23 0515 08/30/23 0909  BP: 126/71 112/69 114/63 106/67  Pulse: 67 68 62 90  Resp: 16 15 17 17   Temp: 98.3 F (36.8 C) 97.7 F (36.5 C) 98.4 F (36.9 C)   TempSrc: Oral Oral Oral   SpO2: 90% 94% 91% 93%  Weight:   59.6 kg   Height:        General: Pt is alert, awake, not in acute distress Cardiovascular: RRR, S1/S2 +, no rubs, no gallops Respiratory: CTA bilaterally, no wheezing, no rhonchi Abdominal: Soft, NT, ND, bowel sounds + Extremities: no edema, no cyanosis    The results of significant diagnostics from this hospitalization (including imaging, microbiology, ancillary and laboratory) are listed below for reference.     Microbiology: Recent Results (from the past 240 hour(s))  Surgical PCR screen      Status: None   Collection Time: 08/29/23  3:44 AM   Specimen: Nasal Mucosa; Nasal Swab  Result Value Ref Range Status   MRSA, PCR NEGATIVE NEGATIVE Final   Staphylococcus aureus NEGATIVE NEGATIVE Final    Comment: (NOTE) The Xpert SA Assay (FDA approved for NASAL specimens in patients 5 years of age and older), is one component of a comprehensive surveillance program. It is not intended to diagnose infection nor to guide or monitor treatment. Performed at [facility], [address] [place], [place] [address]      Labs: BNP (last 3 results) No results for input(s): "BNP" in the last 8760 hours. Basic Metabolic Panel: Recent Labs  Lab 08/28/23 1045 08/29/23 0502 08/30/23 0415  NA 138 140 140  K 3.9 4.4 3.8  CL 103 105 105  CO2 27 22 28   GLUCOSE 107* 120* 98  BUN 21 14 9   CREATININE 0.75 0.67 0.75  CALCIUM 9.4 8.6* 7.8*   Liver Function Tests: Recent Labs  Lab 08/28/23 1045  AST 20  ALT 20  ALKPHOS 59  BILITOT 0.6  PROT 6.8  ALBUMIN 4.3   Recent Labs  Lab 08/28/23 1045  LIPASE 30  No results for input(s): "AMMONIA" in the last 168 hours. CBC: Recent Labs  Lab 08/28/23 1045 08/29/23 0502 08/30/23 0415  WBC 8.7 7.9 5.5  HGB 14.5 13.5 11.9*  HCT 45.0 41.1 36.3  MCV 95.7 94.7 94.5  PLT 320 287 261   Cardiac Enzymes: No results for input(s): "CKTOTAL", "CKMB", "CKMBINDEX", "TROPONINI" in the last 168 hours. BNP: Invalid input(s): "POCBNP" CBG: No results for input(s): "GLUCAP" in the last 168 hours. D-Dimer No results for input(s): "DDIMER" in the last 72 hours. Hgb A1c No results for input(s): "HGBA1C" in the last 72 hours. Lipid Profile No results for input(s): "CHOL", "HDL", "LDLCALC", "TRIG", "CHOLHDL", "LDLDIRECT" in the last 72 hours. Thyroid function studies No results for input(s): "TSH", "T4TOTAL", "T3FREE", "THYROIDAB" in the last 72 hours.  Invalid input(s): "FREET3" Anemia work up No results for input(s): "VITAMINB12",  "FOLATE", "FERRITIN", "TIBC", "IRON", "RETICCTPCT" in the last 72 hours. Urinalysis    Component Value Date/Time   COLORURINE YELLOW 08/28/2023 1045   APPEARANCEUR CLEAR 08/28/2023 1045   LABSPEC 1.029 08/28/2023 1045   PHURINE 5.0 08/28/2023 1045   GLUCOSEU NEGATIVE 08/28/2023 1045   HGBUR NEGATIVE 08/28/2023 1045   BILIRUBINUR NEGATIVE 08/28/2023 1045   KETONESUR NEGATIVE 08/28/2023 1045   PROTEINUR NEGATIVE 08/28/2023 1045   NITRITE NEGATIVE 08/28/2023 1045   LEUKOCYTESUR TRACE (A) 08/28/2023 1045   Sepsis Labs Recent Labs  Lab 08/28/23 1045 08/29/23 0502 08/30/23 0415  WBC 8.7 7.9 5.5   Microbiology Recent Results (from the past 240 hour(s))  Surgical PCR screen     Status: None   Collection Time: 08/29/23  3:44 AM   Specimen: Nasal Mucosa; Nasal Swab  Result Value Ref Range Status   MRSA, PCR NEGATIVE NEGATIVE Final   Staphylococcus aureus NEGATIVE NEGATIVE Final    Comment: (NOTE) The Xpert SA Assay (FDA approved for NASAL specimens in patients 70 years of age and older), is one component of a comprehensive surveillance program. It is not intended to diagnose infection nor to guide or monitor treatment. Performed at [facility], [address] [place], [place] [address]     Please note: You were cared for by a hospitalist during your hospital stay. Once you are discharged, your primary care physician will handle any further medical issues. Please note that NO REFILLS for any discharge medications will be authorized once you are discharged, as it is imperative that you return to your primary care physician (or establish a relationship with a primary care physician if you do not have one) for your post hospital discharge needs so that they can reassess your need for medications and monitor your lab values.    Time coordinating discharge: 40 minutes  SIGNED:   Burnadette Pop, MD  [facility] 08/30/2023, 11:53 AM Pager [phone]  If 7PM-7AM,  please contact night-coverage [email or web address] Password [email or web address]

## 2023-08-30 NOTE — Progress Notes (Signed)
Stopped by to talk with [name].    She has had multiple failed attempts at hiatal hernia repair.  She presented in March with a gastric outlet obstruction related to gastric volvulus.  The obstruction was at the hiatus.  I took her emergently on 02/09/2023 for upper endoscopy and laparoscopic gastrostomy tube placement.  During this procedure, the left diaphragmatic crus had to be incised in order to reduce the stomach sufficiently to place the gastrostomy tube and to relieve the obstruction of the distal stomach.  We attempted to keep the gastrostomy tube in place lifelong to prevent future problems, however, she had significant problems with the site healing and pain and leakage problems once the wound did heal so the tube was finally removed on 05/10/2023.  She did well at home for the last 4 months up until a few days ago.  She had cataract surgery, then later that night her son brought tacos over which she enjoyed and went to sleep.  She woke up in the night with severe abdominal pain up into her chest and up into her shoulder.  She presented to [facility] emergency room 08/28/23, and after a 7-hour wait obtained a CT scan which showed a large left-sided hiatal hernia containing nonobstructed stomach and intestines.  She also received Dilaudid later that night and threw up after the Dilaudid.  Since this experience, she now is feeling well and tolerating liquid diet.  I explained to her the difference in her presentation this time versus back in March and why emergent surgery or intervention is not indicated.  I wonder if she might of developed gastroenteritis from the taco she ate and due to her anatomy developed severe pain due to the viral gastroenteritis as she is not able to vomit normally.  It sounds like the narcotic also did not help the situation.  She has improved with supportive care, and I wonder if this represents a virus clearing her system.  At this point she appears ready for discharge.  I  recommend she is discharged home today with diet as tolerated.  I think she should continue to avoid carbonated beverages, she should chew her food well, she should drink frequently while she eats.  She has been doing well with these instructions for the last few months.  She has previously been seen and evaluated by Dr. Cliffton Asters as well as surgeons at [facility].  I had previously offered her referral to [facility] in [facility] for evaluation by their foregut and thoracic surgeons.  She was not interested in traveling to [facility] at that time, now she feels open to the idea of at least visiting with them and getting their opinion.  In general, she is not interested in a massive surgery.  I am pessimistic about the success rate of repeated attempt at fixing this hernia, and at this point I feel the left chest has been forfeited to the abdomen.  I think she did the right thing coming to the emergency room due to her symptoms.  Certainly, if she were to develop acute obstruction again in the future, she may need nasogastric tube decompression, endoscopic decompression, and/or possible surgical intervention.  Thankfully that does not appear to be the case this time.  I discussed the care of the patient with Dr. Cliffton Asters who is running the emergency general surgery service this week.  I will send a message to Dr. Renford Dills as well.

## 2023-09-05 DIAGNOSIS — E785 Hyperlipidemia, unspecified: Secondary | ICD-10-CM | POA: Diagnosis not present

## 2023-09-05 DIAGNOSIS — E559 Vitamin D deficiency, unspecified: Secondary | ICD-10-CM | POA: Diagnosis not present

## 2023-09-05 DIAGNOSIS — E538 Deficiency of other specified B group vitamins: Secondary | ICD-10-CM | POA: Diagnosis not present

## 2023-09-05 DIAGNOSIS — E039 Hypothyroidism, unspecified: Secondary | ICD-10-CM | POA: Diagnosis not present

## 2023-09-05 DIAGNOSIS — Z1389 Encounter for screening for other disorder: Secondary | ICD-10-CM | POA: Diagnosis not present

## 2023-09-06 DIAGNOSIS — R531 Weakness: Secondary | ICD-10-CM | POA: Diagnosis not present

## 2023-09-06 DIAGNOSIS — M170 Bilateral primary osteoarthritis of knee: Secondary | ICD-10-CM | POA: Diagnosis not present

## 2023-09-09 DIAGNOSIS — R531 Weakness: Secondary | ICD-10-CM | POA: Diagnosis not present

## 2023-09-09 DIAGNOSIS — M170 Bilateral primary osteoarthritis of knee: Secondary | ICD-10-CM | POA: Diagnosis not present

## 2023-09-12 DIAGNOSIS — Z1331 Encounter for screening for depression: Secondary | ICD-10-CM | POA: Diagnosis not present

## 2023-09-12 DIAGNOSIS — Z931 Gastrostomy status: Secondary | ICD-10-CM | POA: Diagnosis not present

## 2023-09-12 DIAGNOSIS — I2510 Atherosclerotic heart disease of native coronary artery without angina pectoris: Secondary | ICD-10-CM | POA: Diagnosis not present

## 2023-09-12 DIAGNOSIS — K449 Diaphragmatic hernia without obstruction or gangrene: Secondary | ICD-10-CM | POA: Diagnosis not present

## 2023-09-12 DIAGNOSIS — I509 Heart failure, unspecified: Secondary | ICD-10-CM | POA: Diagnosis not present

## 2023-09-12 DIAGNOSIS — R413 Other amnesia: Secondary | ICD-10-CM | POA: Diagnosis not present

## 2023-09-12 DIAGNOSIS — I700 Atherosclerosis of aorta: Secondary | ICD-10-CM | POA: Diagnosis not present

## 2023-09-12 DIAGNOSIS — F4321 Adjustment disorder with depressed mood: Secondary | ICD-10-CM | POA: Diagnosis not present

## 2023-09-12 DIAGNOSIS — E039 Hypothyroidism, unspecified: Secondary | ICD-10-CM | POA: Diagnosis not present

## 2023-09-12 DIAGNOSIS — R82998 Other abnormal findings in urine: Secondary | ICD-10-CM | POA: Diagnosis not present

## 2023-09-12 DIAGNOSIS — M8580 Other specified disorders of bone density and structure, unspecified site: Secondary | ICD-10-CM | POA: Diagnosis not present

## 2023-09-12 DIAGNOSIS — Z0000 Encounter for general adult medical examination without abnormal findings: Secondary | ICD-10-CM | POA: Diagnosis not present

## 2023-09-12 DIAGNOSIS — H34232 Retinal artery branch occlusion, left eye: Secondary | ICD-10-CM | POA: Diagnosis not present

## 2023-09-12 DIAGNOSIS — N2889 Other specified disorders of kidney and ureter: Secondary | ICD-10-CM | POA: Diagnosis not present

## 2023-09-12 DIAGNOSIS — E785 Hyperlipidemia, unspecified: Secondary | ICD-10-CM | POA: Diagnosis not present

## 2023-09-12 DIAGNOSIS — Z23 Encounter for immunization: Secondary | ICD-10-CM | POA: Diagnosis not present

## 2023-09-16 DIAGNOSIS — M170 Bilateral primary osteoarthritis of knee: Secondary | ICD-10-CM | POA: Diagnosis not present

## 2023-09-16 DIAGNOSIS — R531 Weakness: Secondary | ICD-10-CM | POA: Diagnosis not present

## 2023-09-18 DIAGNOSIS — R531 Weakness: Secondary | ICD-10-CM | POA: Diagnosis not present

## 2023-09-18 DIAGNOSIS — M170 Bilateral primary osteoarthritis of knee: Secondary | ICD-10-CM | POA: Diagnosis not present

## 2023-09-20 DIAGNOSIS — M170 Bilateral primary osteoarthritis of knee: Secondary | ICD-10-CM | POA: Diagnosis not present

## 2023-09-20 DIAGNOSIS — M1712 Unilateral primary osteoarthritis, left knee: Secondary | ICD-10-CM | POA: Diagnosis not present

## 2023-09-20 DIAGNOSIS — M1711 Unilateral primary osteoarthritis, right knee: Secondary | ICD-10-CM | POA: Diagnosis not present

## 2023-09-24 DIAGNOSIS — M170 Bilateral primary osteoarthritis of knee: Secondary | ICD-10-CM | POA: Diagnosis not present

## 2023-09-24 DIAGNOSIS — R531 Weakness: Secondary | ICD-10-CM | POA: Diagnosis not present

## 2023-09-26 DIAGNOSIS — R531 Weakness: Secondary | ICD-10-CM | POA: Diagnosis not present

## 2023-09-26 DIAGNOSIS — M170 Bilateral primary osteoarthritis of knee: Secondary | ICD-10-CM | POA: Diagnosis not present

## 2023-10-04 DIAGNOSIS — R531 Weakness: Secondary | ICD-10-CM | POA: Diagnosis not present

## 2023-10-04 DIAGNOSIS — M170 Bilateral primary osteoarthritis of knee: Secondary | ICD-10-CM | POA: Diagnosis not present

## 2023-10-07 DIAGNOSIS — M170 Bilateral primary osteoarthritis of knee: Secondary | ICD-10-CM | POA: Diagnosis not present

## 2023-10-07 DIAGNOSIS — R531 Weakness: Secondary | ICD-10-CM | POA: Diagnosis not present

## 2023-10-10 DIAGNOSIS — M170 Bilateral primary osteoarthritis of knee: Secondary | ICD-10-CM | POA: Diagnosis not present

## 2023-10-10 DIAGNOSIS — R531 Weakness: Secondary | ICD-10-CM | POA: Diagnosis not present

## 2023-10-14 DIAGNOSIS — R531 Weakness: Secondary | ICD-10-CM | POA: Diagnosis not present

## 2023-10-14 DIAGNOSIS — M170 Bilateral primary osteoarthritis of knee: Secondary | ICD-10-CM | POA: Diagnosis not present

## 2023-10-17 DIAGNOSIS — R413 Other amnesia: Secondary | ICD-10-CM | POA: Diagnosis not present

## 2023-10-17 DIAGNOSIS — R4181 Age-related cognitive decline: Secondary | ICD-10-CM | POA: Diagnosis not present

## 2023-10-17 DIAGNOSIS — Z23 Encounter for immunization: Secondary | ICD-10-CM | POA: Diagnosis not present

## 2023-10-17 DIAGNOSIS — F339 Major depressive disorder, recurrent, unspecified: Secondary | ICD-10-CM | POA: Diagnosis not present

## 2023-10-18 DIAGNOSIS — M170 Bilateral primary osteoarthritis of knee: Secondary | ICD-10-CM | POA: Diagnosis not present

## 2023-10-18 DIAGNOSIS — R531 Weakness: Secondary | ICD-10-CM | POA: Diagnosis not present

## 2023-10-21 DIAGNOSIS — R531 Weakness: Secondary | ICD-10-CM | POA: Diagnosis not present

## 2023-10-21 DIAGNOSIS — M170 Bilateral primary osteoarthritis of knee: Secondary | ICD-10-CM | POA: Diagnosis not present

## 2023-10-22 DIAGNOSIS — K449 Diaphragmatic hernia without obstruction or gangrene: Secondary | ICD-10-CM | POA: Diagnosis not present

## 2023-10-22 DIAGNOSIS — E440 Moderate protein-calorie malnutrition: Secondary | ICD-10-CM | POA: Diagnosis not present

## 2023-11-12 DIAGNOSIS — Z961 Presence of intraocular lens: Secondary | ICD-10-CM | POA: Diagnosis not present

## 2023-11-12 DIAGNOSIS — H2512 Age-related nuclear cataract, left eye: Secondary | ICD-10-CM | POA: Diagnosis not present

## 2023-11-12 DIAGNOSIS — H25812 Combined forms of age-related cataract, left eye: Secondary | ICD-10-CM | POA: Diagnosis not present

## 2023-12-10 DIAGNOSIS — R0609 Other forms of dyspnea: Secondary | ICD-10-CM | POA: Diagnosis not present

## 2023-12-10 DIAGNOSIS — R109 Unspecified abdominal pain: Secondary | ICD-10-CM | POA: Diagnosis not present

## 2023-12-10 DIAGNOSIS — K449 Diaphragmatic hernia without obstruction or gangrene: Secondary | ICD-10-CM | POA: Diagnosis not present

## 2023-12-10 DIAGNOSIS — J90 Pleural effusion, not elsewhere classified: Secondary | ICD-10-CM | POA: Diagnosis not present

## 2023-12-10 DIAGNOSIS — Z8719 Personal history of other diseases of the digestive system: Secondary | ICD-10-CM | POA: Diagnosis not present

## 2023-12-10 DIAGNOSIS — Z789 Other specified health status: Secondary | ICD-10-CM | POA: Diagnosis not present

## 2023-12-10 DIAGNOSIS — Z9889 Other specified postprocedural states: Secondary | ICD-10-CM | POA: Diagnosis not present

## 2023-12-10 DIAGNOSIS — K5730 Diverticulosis of large intestine without perforation or abscess without bleeding: Secondary | ICD-10-CM | POA: Diagnosis not present

## 2023-12-11 ENCOUNTER — Other Ambulatory Visit (HOSPITAL_COMMUNITY): Payer: Self-pay | Admitting: *Deleted

## 2023-12-12 ENCOUNTER — Encounter (HOSPITAL_COMMUNITY)
Admission: RE | Admit: 2023-12-12 | Discharge: 2023-12-12 | Disposition: A | Discharge status: Home / Self Care | Payer: Medicare Other | Source: Ambulatory Visit | Attending: Internal Medicine | Admitting: Internal Medicine

## 2023-12-12 DIAGNOSIS — M810 Age-related osteoporosis without current pathological fracture: Secondary | ICD-10-CM | POA: Insufficient documentation

## 2023-12-12 MED ORDER — INCLISIRAN SODIUM 284 MG/1.5ML ~~LOC~~ SOSY
PREFILLED_SYRINGE | SUBCUTANEOUS | Status: AC
  Filled 2023-12-12: qty 1.5

## 2023-12-12 MED ORDER — DENOSUMAB 60 MG/ML ~~LOC~~ SOSY
PREFILLED_SYRINGE | SUBCUTANEOUS | Status: AC
  Administered 2023-12-12: 60 mg via SUBCUTANEOUS
  Filled 2023-12-12: qty 1

## 2023-12-12 MED ORDER — INCLISIRAN SODIUM 284 MG/1.5ML ~~LOC~~ SOSY
284.0000 mg | PREFILLED_SYRINGE | Freq: Once | SUBCUTANEOUS | Status: AC
  Administered 2023-12-12: 284 mg via SUBCUTANEOUS

## 2023-12-12 MED ORDER — DENOSUMAB 60 MG/ML ~~LOC~~ SOSY: 60.0000 mg | PREFILLED_SYRINGE | Freq: Once | SUBCUTANEOUS | Status: AC

## 2023-12-25 DIAGNOSIS — M1712 Unilateral primary osteoarthritis, left knee: Secondary | ICD-10-CM | POA: Diagnosis not present

## 2023-12-25 DIAGNOSIS — M170 Bilateral primary osteoarthritis of knee: Secondary | ICD-10-CM | POA: Diagnosis not present

## 2023-12-25 DIAGNOSIS — M1711 Unilateral primary osteoarthritis, right knee: Secondary | ICD-10-CM | POA: Diagnosis not present

## 2023-12-31 DIAGNOSIS — Z9189 Other specified personal risk factors, not elsewhere classified: Secondary | ICD-10-CM | POA: Diagnosis not present

## 2023-12-31 DIAGNOSIS — Z8719 Personal history of other diseases of the digestive system: Secondary | ICD-10-CM | POA: Diagnosis not present

## 2023-12-31 DIAGNOSIS — Z9889 Other specified postprocedural states: Secondary | ICD-10-CM | POA: Diagnosis not present

## 2023-12-31 DIAGNOSIS — R5381 Other malaise: Secondary | ICD-10-CM | POA: Diagnosis not present

## 2023-12-31 DIAGNOSIS — R0609 Other forms of dyspnea: Secondary | ICD-10-CM | POA: Diagnosis not present

## 2023-12-31 DIAGNOSIS — K449 Diaphragmatic hernia without obstruction or gangrene: Secondary | ICD-10-CM | POA: Diagnosis not present

## 2024-01-14 DIAGNOSIS — R0602 Shortness of breath: Secondary | ICD-10-CM | POA: Diagnosis not present

## 2024-01-14 DIAGNOSIS — R4189 Other symptoms and signs involving cognitive functions and awareness: Secondary | ICD-10-CM | POA: Diagnosis not present

## 2024-01-14 DIAGNOSIS — Z01818 Encounter for other preprocedural examination: Secondary | ICD-10-CM | POA: Diagnosis not present

## 2024-01-14 DIAGNOSIS — Z9181 History of falling: Secondary | ICD-10-CM | POA: Diagnosis not present

## 2024-01-14 DIAGNOSIS — F339 Major depressive disorder, recurrent, unspecified: Secondary | ICD-10-CM | POA: Diagnosis not present

## 2024-01-14 DIAGNOSIS — Z713 Dietary counseling and surveillance: Secondary | ICD-10-CM | POA: Diagnosis not present

## 2024-01-14 DIAGNOSIS — E440 Moderate protein-calorie malnutrition: Secondary | ICD-10-CM | POA: Diagnosis not present

## 2024-01-14 DIAGNOSIS — I447 Left bundle-branch block, unspecified: Secondary | ICD-10-CM | POA: Diagnosis not present

## 2024-01-14 DIAGNOSIS — K3189 Other diseases of stomach and duodenum: Secondary | ICD-10-CM | POA: Diagnosis not present

## 2024-01-14 DIAGNOSIS — Z6822 Body mass index (BMI) 22.0-22.9, adult: Secondary | ICD-10-CM | POA: Diagnosis not present

## 2024-01-14 DIAGNOSIS — K449 Diaphragmatic hernia without obstruction or gangrene: Secondary | ICD-10-CM | POA: Diagnosis not present

## 2024-01-14 DIAGNOSIS — R9431 Abnormal electrocardiogram [ECG] [EKG]: Secondary | ICD-10-CM | POA: Diagnosis not present

## 2024-01-14 DIAGNOSIS — K269 Duodenal ulcer, unspecified as acute or chronic, without hemorrhage or perforation: Secondary | ICD-10-CM | POA: Diagnosis not present

## 2024-01-14 DIAGNOSIS — Z9189 Other specified personal risk factors, not elsewhere classified: Secondary | ICD-10-CM | POA: Diagnosis not present

## 2024-01-24 DIAGNOSIS — J939 Pneumothorax, unspecified: Secondary | ICD-10-CM | POA: Diagnosis not present

## 2024-01-24 DIAGNOSIS — R2689 Other abnormalities of gait and mobility: Secondary | ICD-10-CM | POA: Diagnosis not present

## 2024-01-24 DIAGNOSIS — E441 Mild protein-calorie malnutrition: Secondary | ICD-10-CM | POA: Diagnosis not present

## 2024-01-24 DIAGNOSIS — J9819 Other pulmonary collapse: Secondary | ICD-10-CM | POA: Diagnosis not present

## 2024-01-24 DIAGNOSIS — I447 Left bundle-branch block, unspecified: Secondary | ICD-10-CM | POA: Diagnosis not present

## 2024-01-24 DIAGNOSIS — K562 Volvulus: Secondary | ICD-10-CM | POA: Diagnosis not present

## 2024-01-24 DIAGNOSIS — Z9189 Other specified personal risk factors, not elsewhere classified: Secondary | ICD-10-CM | POA: Diagnosis not present

## 2024-01-24 DIAGNOSIS — K3189 Other diseases of stomach and duodenum: Secondary | ICD-10-CM | POA: Diagnosis not present

## 2024-01-24 DIAGNOSIS — Z9889 Other specified postprocedural states: Secondary | ICD-10-CM | POA: Diagnosis not present

## 2024-01-24 DIAGNOSIS — R2681 Unsteadiness on feet: Secondary | ICD-10-CM | POA: Diagnosis not present

## 2024-01-24 DIAGNOSIS — G8929 Other chronic pain: Secondary | ICD-10-CM | POA: Diagnosis not present

## 2024-01-24 DIAGNOSIS — K269 Duodenal ulcer, unspecified as acute or chronic, without hemorrhage or perforation: Secondary | ICD-10-CM | POA: Diagnosis not present

## 2024-01-24 DIAGNOSIS — R918 Other nonspecific abnormal finding of lung field: Secondary | ICD-10-CM | POA: Diagnosis not present

## 2024-01-24 DIAGNOSIS — K219 Gastro-esophageal reflux disease without esophagitis: Secondary | ICD-10-CM | POA: Diagnosis not present

## 2024-01-24 DIAGNOSIS — F32A Depression, unspecified: Secondary | ICD-10-CM | POA: Diagnosis not present

## 2024-01-24 DIAGNOSIS — E785 Hyperlipidemia, unspecified: Secondary | ICD-10-CM | POA: Diagnosis not present

## 2024-01-24 DIAGNOSIS — H3412 Central retinal artery occlusion, left eye: Secondary | ICD-10-CM | POA: Diagnosis not present

## 2024-01-24 DIAGNOSIS — R278 Other lack of coordination: Secondary | ICD-10-CM | POA: Diagnosis not present

## 2024-01-24 DIAGNOSIS — M810 Age-related osteoporosis without current pathological fracture: Secondary | ICD-10-CM | POA: Diagnosis not present

## 2024-01-24 DIAGNOSIS — H449 Unspecified disorder of globe: Secondary | ICD-10-CM | POA: Diagnosis not present

## 2024-01-24 DIAGNOSIS — M6281 Muscle weakness (generalized): Secondary | ICD-10-CM | POA: Diagnosis not present

## 2024-01-24 DIAGNOSIS — Z885 Allergy status to narcotic agent status: Secondary | ICD-10-CM | POA: Diagnosis not present

## 2024-01-24 DIAGNOSIS — Z8719 Personal history of other diseases of the digestive system: Secondary | ICD-10-CM | POA: Diagnosis not present

## 2024-01-24 DIAGNOSIS — E039 Hypothyroidism, unspecified: Secondary | ICD-10-CM | POA: Diagnosis not present

## 2024-01-24 DIAGNOSIS — E440 Moderate protein-calorie malnutrition: Secondary | ICD-10-CM | POA: Diagnosis not present

## 2024-01-24 DIAGNOSIS — N189 Chronic kidney disease, unspecified: Secondary | ICD-10-CM | POA: Diagnosis not present

## 2024-01-24 DIAGNOSIS — F339 Major depressive disorder, recurrent, unspecified: Secondary | ICD-10-CM | POA: Diagnosis not present

## 2024-01-24 DIAGNOSIS — Z9181 History of falling: Secondary | ICD-10-CM | POA: Diagnosis not present

## 2024-01-24 DIAGNOSIS — K449 Diaphragmatic hernia without obstruction or gangrene: Secondary | ICD-10-CM | POA: Diagnosis not present

## 2024-01-24 DIAGNOSIS — F419 Anxiety disorder, unspecified: Secondary | ICD-10-CM | POA: Diagnosis not present

## 2024-01-24 DIAGNOSIS — R262 Difficulty in walking, not elsewhere classified: Secondary | ICD-10-CM | POA: Diagnosis not present

## 2024-01-24 DIAGNOSIS — Z85828 Personal history of other malignant neoplasm of skin: Secondary | ICD-10-CM | POA: Diagnosis not present

## 2024-01-24 DIAGNOSIS — Z888 Allergy status to other drugs, medicaments and biological substances status: Secondary | ICD-10-CM | POA: Diagnosis not present

## 2024-01-24 DIAGNOSIS — Z4682 Encounter for fitting and adjustment of non-vascular catheter: Secondary | ICD-10-CM | POA: Diagnosis not present

## 2024-01-24 DIAGNOSIS — Z8616 Personal history of COVID-19: Secondary | ICD-10-CM | POA: Diagnosis not present

## 2024-01-24 DIAGNOSIS — K260 Acute duodenal ulcer with hemorrhage: Secondary | ICD-10-CM | POA: Diagnosis not present

## 2024-01-24 DIAGNOSIS — G8918 Other acute postprocedural pain: Secondary | ICD-10-CM | POA: Diagnosis not present

## 2024-01-24 DIAGNOSIS — Z7982 Long term (current) use of aspirin: Secondary | ICD-10-CM | POA: Diagnosis not present

## 2024-01-28 DIAGNOSIS — K279 Peptic ulcer, site unspecified, unspecified as acute or chronic, without hemorrhage or perforation: Secondary | ICD-10-CM | POA: Diagnosis not present

## 2024-01-28 DIAGNOSIS — H3412 Central retinal artery occlusion, left eye: Secondary | ICD-10-CM | POA: Diagnosis not present

## 2024-01-28 DIAGNOSIS — R278 Other lack of coordination: Secondary | ICD-10-CM | POA: Diagnosis not present

## 2024-01-28 DIAGNOSIS — R2689 Other abnormalities of gait and mobility: Secondary | ICD-10-CM | POA: Diagnosis not present

## 2024-01-28 DIAGNOSIS — K449 Diaphragmatic hernia without obstruction or gangrene: Secondary | ICD-10-CM | POA: Diagnosis not present

## 2024-01-28 DIAGNOSIS — R2681 Unsteadiness on feet: Secondary | ICD-10-CM | POA: Diagnosis not present

## 2024-01-28 DIAGNOSIS — M6281 Muscle weakness (generalized): Secondary | ICD-10-CM | POA: Diagnosis not present

## 2024-01-28 DIAGNOSIS — E785 Hyperlipidemia, unspecified: Secondary | ICD-10-CM | POA: Diagnosis not present

## 2024-01-28 DIAGNOSIS — E441 Mild protein-calorie malnutrition: Secondary | ICD-10-CM | POA: Diagnosis not present

## 2024-01-28 DIAGNOSIS — E440 Moderate protein-calorie malnutrition: Secondary | ICD-10-CM | POA: Diagnosis not present

## 2024-01-28 DIAGNOSIS — H449 Unspecified disorder of globe: Secondary | ICD-10-CM | POA: Diagnosis not present

## 2024-01-28 DIAGNOSIS — K260 Acute duodenal ulcer with hemorrhage: Secondary | ICD-10-CM | POA: Diagnosis not present

## 2024-01-28 DIAGNOSIS — F3341 Major depressive disorder, recurrent, in partial remission: Secondary | ICD-10-CM | POA: Diagnosis not present

## 2024-01-28 DIAGNOSIS — E039 Hypothyroidism, unspecified: Secondary | ICD-10-CM | POA: Diagnosis not present

## 2024-01-28 DIAGNOSIS — R262 Difficulty in walking, not elsewhere classified: Secondary | ICD-10-CM | POA: Diagnosis not present

## 2024-01-30 DIAGNOSIS — K449 Diaphragmatic hernia without obstruction or gangrene: Secondary | ICD-10-CM

## 2024-01-30 DIAGNOSIS — K260 Acute duodenal ulcer with hemorrhage: Secondary | ICD-10-CM | POA: Diagnosis not present

## 2024-01-30 DIAGNOSIS — H3412 Central retinal artery occlusion, left eye: Secondary | ICD-10-CM | POA: Diagnosis not present

## 2024-01-30 DIAGNOSIS — F3341 Major depressive disorder, recurrent, in partial remission: Secondary | ICD-10-CM

## 2024-01-30 DIAGNOSIS — K279 Peptic ulcer, site unspecified, unspecified as acute or chronic, without hemorrhage or perforation: Secondary | ICD-10-CM

## 2024-01-30 DIAGNOSIS — E441 Mild protein-calorie malnutrition: Secondary | ICD-10-CM | POA: Diagnosis not present

## 2024-01-30 DIAGNOSIS — E440 Moderate protein-calorie malnutrition: Secondary | ICD-10-CM

## 2024-02-04 DIAGNOSIS — K449 Diaphragmatic hernia without obstruction or gangrene: Secondary | ICD-10-CM | POA: Diagnosis not present

## 2024-02-09 DIAGNOSIS — E039 Hypothyroidism, unspecified: Secondary | ICD-10-CM | POA: Diagnosis not present

## 2024-02-09 DIAGNOSIS — M1990 Unspecified osteoarthritis, unspecified site: Secondary | ICD-10-CM | POA: Diagnosis not present

## 2024-02-09 DIAGNOSIS — E440 Moderate protein-calorie malnutrition: Secondary | ICD-10-CM | POA: Diagnosis not present

## 2024-02-09 DIAGNOSIS — D649 Anemia, unspecified: Secondary | ICD-10-CM | POA: Diagnosis not present

## 2024-02-09 DIAGNOSIS — Z556 Problems related to health literacy: Secondary | ICD-10-CM | POA: Diagnosis not present

## 2024-02-09 DIAGNOSIS — K219 Gastro-esophageal reflux disease without esophagitis: Secondary | ICD-10-CM | POA: Diagnosis not present

## 2024-02-09 DIAGNOSIS — F339 Major depressive disorder, recurrent, unspecified: Secondary | ICD-10-CM | POA: Diagnosis not present

## 2024-02-09 DIAGNOSIS — Z48815 Encounter for surgical aftercare following surgery on the digestive system: Secondary | ICD-10-CM | POA: Diagnosis not present

## 2024-02-11 DIAGNOSIS — K219 Gastro-esophageal reflux disease without esophagitis: Secondary | ICD-10-CM | POA: Diagnosis not present

## 2024-02-11 DIAGNOSIS — F339 Major depressive disorder, recurrent, unspecified: Secondary | ICD-10-CM | POA: Diagnosis not present

## 2024-02-11 DIAGNOSIS — M1990 Unspecified osteoarthritis, unspecified site: Secondary | ICD-10-CM | POA: Diagnosis not present

## 2024-02-11 DIAGNOSIS — E440 Moderate protein-calorie malnutrition: Secondary | ICD-10-CM | POA: Diagnosis not present

## 2024-02-11 DIAGNOSIS — D649 Anemia, unspecified: Secondary | ICD-10-CM | POA: Diagnosis not present

## 2024-02-11 DIAGNOSIS — Z48815 Encounter for surgical aftercare following surgery on the digestive system: Secondary | ICD-10-CM | POA: Diagnosis not present

## 2024-02-13 DIAGNOSIS — K219 Gastro-esophageal reflux disease without esophagitis: Secondary | ICD-10-CM | POA: Diagnosis not present

## 2024-02-13 DIAGNOSIS — E440 Moderate protein-calorie malnutrition: Secondary | ICD-10-CM | POA: Diagnosis not present

## 2024-02-13 DIAGNOSIS — Z48815 Encounter for surgical aftercare following surgery on the digestive system: Secondary | ICD-10-CM | POA: Diagnosis not present

## 2024-02-13 DIAGNOSIS — F339 Major depressive disorder, recurrent, unspecified: Secondary | ICD-10-CM | POA: Diagnosis not present

## 2024-02-13 DIAGNOSIS — D649 Anemia, unspecified: Secondary | ICD-10-CM | POA: Diagnosis not present

## 2024-02-13 DIAGNOSIS — M1990 Unspecified osteoarthritis, unspecified site: Secondary | ICD-10-CM | POA: Diagnosis not present

## 2024-02-14 DIAGNOSIS — E440 Moderate protein-calorie malnutrition: Secondary | ICD-10-CM | POA: Diagnosis not present

## 2024-02-14 DIAGNOSIS — Z48815 Encounter for surgical aftercare following surgery on the digestive system: Secondary | ICD-10-CM | POA: Diagnosis not present

## 2024-02-14 DIAGNOSIS — K219 Gastro-esophageal reflux disease without esophagitis: Secondary | ICD-10-CM | POA: Diagnosis not present

## 2024-02-14 DIAGNOSIS — D649 Anemia, unspecified: Secondary | ICD-10-CM | POA: Diagnosis not present

## 2024-02-14 DIAGNOSIS — M1990 Unspecified osteoarthritis, unspecified site: Secondary | ICD-10-CM | POA: Diagnosis not present

## 2024-02-14 DIAGNOSIS — F339 Major depressive disorder, recurrent, unspecified: Secondary | ICD-10-CM | POA: Diagnosis not present

## 2024-02-17 DIAGNOSIS — Z48815 Encounter for surgical aftercare following surgery on the digestive system: Secondary | ICD-10-CM | POA: Diagnosis not present

## 2024-02-17 DIAGNOSIS — K219 Gastro-esophageal reflux disease without esophagitis: Secondary | ICD-10-CM | POA: Diagnosis not present

## 2024-02-17 DIAGNOSIS — F339 Major depressive disorder, recurrent, unspecified: Secondary | ICD-10-CM | POA: Diagnosis not present

## 2024-02-17 DIAGNOSIS — M1990 Unspecified osteoarthritis, unspecified site: Secondary | ICD-10-CM | POA: Diagnosis not present

## 2024-02-17 DIAGNOSIS — D649 Anemia, unspecified: Secondary | ICD-10-CM | POA: Diagnosis not present

## 2024-02-17 DIAGNOSIS — E440 Moderate protein-calorie malnutrition: Secondary | ICD-10-CM | POA: Diagnosis not present

## 2024-02-19 DIAGNOSIS — Z48815 Encounter for surgical aftercare following surgery on the digestive system: Secondary | ICD-10-CM | POA: Diagnosis not present

## 2024-02-19 DIAGNOSIS — M1990 Unspecified osteoarthritis, unspecified site: Secondary | ICD-10-CM | POA: Diagnosis not present

## 2024-02-19 DIAGNOSIS — D649 Anemia, unspecified: Secondary | ICD-10-CM | POA: Diagnosis not present

## 2024-02-19 DIAGNOSIS — K219 Gastro-esophageal reflux disease without esophagitis: Secondary | ICD-10-CM | POA: Diagnosis not present

## 2024-02-19 DIAGNOSIS — E440 Moderate protein-calorie malnutrition: Secondary | ICD-10-CM | POA: Diagnosis not present

## 2024-02-19 DIAGNOSIS — F339 Major depressive disorder, recurrent, unspecified: Secondary | ICD-10-CM | POA: Diagnosis not present

## 2024-02-20 DIAGNOSIS — Z48815 Encounter for surgical aftercare following surgery on the digestive system: Secondary | ICD-10-CM | POA: Diagnosis not present

## 2024-02-20 DIAGNOSIS — Z9889 Other specified postprocedural states: Secondary | ICD-10-CM | POA: Diagnosis not present

## 2024-02-20 DIAGNOSIS — E440 Moderate protein-calorie malnutrition: Secondary | ICD-10-CM | POA: Diagnosis not present

## 2024-02-20 DIAGNOSIS — K3189 Other diseases of stomach and duodenum: Secondary | ICD-10-CM | POA: Diagnosis not present

## 2024-02-20 DIAGNOSIS — K449 Diaphragmatic hernia without obstruction or gangrene: Secondary | ICD-10-CM | POA: Diagnosis not present

## 2024-02-20 DIAGNOSIS — Z9181 History of falling: Secondary | ICD-10-CM | POA: Diagnosis not present

## 2024-02-20 DIAGNOSIS — F339 Major depressive disorder, recurrent, unspecified: Secondary | ICD-10-CM | POA: Diagnosis not present

## 2024-02-20 DIAGNOSIS — K269 Duodenal ulcer, unspecified as acute or chronic, without hemorrhage or perforation: Secondary | ICD-10-CM | POA: Diagnosis not present

## 2024-02-20 DIAGNOSIS — Z9189 Other specified personal risk factors, not elsewhere classified: Secondary | ICD-10-CM | POA: Diagnosis not present

## 2024-02-20 DIAGNOSIS — R0981 Nasal congestion: Secondary | ICD-10-CM | POA: Diagnosis not present

## 2024-02-20 DIAGNOSIS — E785 Hyperlipidemia, unspecified: Secondary | ICD-10-CM | POA: Diagnosis not present

## 2024-02-20 DIAGNOSIS — K219 Gastro-esophageal reflux disease without esophagitis: Secondary | ICD-10-CM | POA: Diagnosis not present

## 2024-02-20 DIAGNOSIS — M1990 Unspecified osteoarthritis, unspecified site: Secondary | ICD-10-CM | POA: Diagnosis not present

## 2024-02-20 DIAGNOSIS — Z8719 Personal history of other diseases of the digestive system: Secondary | ICD-10-CM | POA: Diagnosis not present

## 2024-02-20 DIAGNOSIS — D649 Anemia, unspecified: Secondary | ICD-10-CM | POA: Diagnosis not present

## 2024-02-20 DIAGNOSIS — E039 Hypothyroidism, unspecified: Secondary | ICD-10-CM | POA: Diagnosis not present

## 2024-02-24 DIAGNOSIS — E440 Moderate protein-calorie malnutrition: Secondary | ICD-10-CM | POA: Diagnosis not present

## 2024-02-24 DIAGNOSIS — D649 Anemia, unspecified: Secondary | ICD-10-CM | POA: Diagnosis not present

## 2024-02-24 DIAGNOSIS — F339 Major depressive disorder, recurrent, unspecified: Secondary | ICD-10-CM | POA: Diagnosis not present

## 2024-02-24 DIAGNOSIS — M1990 Unspecified osteoarthritis, unspecified site: Secondary | ICD-10-CM | POA: Diagnosis not present

## 2024-02-24 DIAGNOSIS — Z48815 Encounter for surgical aftercare following surgery on the digestive system: Secondary | ICD-10-CM | POA: Diagnosis not present

## 2024-02-24 DIAGNOSIS — K219 Gastro-esophageal reflux disease without esophagitis: Secondary | ICD-10-CM | POA: Diagnosis not present

## 2024-02-26 DIAGNOSIS — F339 Major depressive disorder, recurrent, unspecified: Secondary | ICD-10-CM | POA: Diagnosis not present

## 2024-02-26 DIAGNOSIS — E440 Moderate protein-calorie malnutrition: Secondary | ICD-10-CM | POA: Diagnosis not present

## 2024-02-26 DIAGNOSIS — K219 Gastro-esophageal reflux disease without esophagitis: Secondary | ICD-10-CM | POA: Diagnosis not present

## 2024-02-26 DIAGNOSIS — M1990 Unspecified osteoarthritis, unspecified site: Secondary | ICD-10-CM | POA: Diagnosis not present

## 2024-02-26 DIAGNOSIS — D649 Anemia, unspecified: Secondary | ICD-10-CM | POA: Diagnosis not present

## 2024-02-26 DIAGNOSIS — Z48815 Encounter for surgical aftercare following surgery on the digestive system: Secondary | ICD-10-CM | POA: Diagnosis not present

## 2024-02-28 ENCOUNTER — Encounter (HOSPITAL_COMMUNITY): Payer: Self-pay

## 2024-02-28 ENCOUNTER — Telehealth (HOSPITAL_COMMUNITY): Payer: Self-pay

## 2024-02-28 NOTE — Telephone Encounter (Signed)
 Pt insurance is active and benefits verified through Medicare A/B. Co-pay $0.00, DED $257.00/$257.00 met, out of pocket $0.00/$0.00 met, co-insurance 20%. No pre-authorization required. 02/28/24 @ 12:12PM   2ndary insurance is active and benefits verified through [employer]. Co-pay $0.00, DED $0.00/$0.00 met, out of pocket $0.00/$0.00 met, co-insurance 0%. No pre-authorization required. 02/28/24 @ 12:12PM

## 2024-02-28 NOTE — Progress Notes (Signed)
 Called patient to see if she was interested in participating in the Pulmonary Rehab Program. Patient will come in for orientation on 03/02/24 and will attend the 10:15 exercise class.  Pensions consultant.

## 2024-02-28 NOTE — Progress Notes (Signed)
 Received referral from Dr. Rhona Raider for this pt to participate in Pulmonary Rehab with the diagnosis of shortness of breath. Clinical review of pt follow up appt on 02/04/24 surgeon office note. Pt appropriate for scheduling for Pulmonary rehab. Will forward to support staff for scheduling and verification of insurance eligibility/benefits with pt consent.   [facility]

## 2024-03-02 ENCOUNTER — Encounter (HOSPITAL_COMMUNITY)
Admission: RE | Admit: 2024-03-02 | Discharge: 2024-03-02 | Disposition: A | Discharge status: Home / Self Care | Source: Ambulatory Visit | Attending: Internal Medicine | Admitting: Internal Medicine

## 2024-03-02 ENCOUNTER — Encounter (HOSPITAL_COMMUNITY): Payer: Self-pay

## 2024-03-02 VITALS — BP 106/70 | HR 82 | Ht 64.0 in | Wt 127.9 lb

## 2024-03-02 DIAGNOSIS — R0602 Shortness of breath: Secondary | ICD-10-CM | POA: Insufficient documentation

## 2024-03-02 NOTE — Progress Notes (Signed)
 Monica White 83 y.o. female Pulmonary Rehab Orientation Note This patient who was referred to Pulmonary Rehab by Dr. Rhona Raider with the diagnosis of shortness of breath arrived today in Cardiac and Pulmonary Rehab. She  arrived ambulatory with ataxic gait. She  does not carry portable oxygen. Per patient, Monica White uses oxygen never. Color good, skin warm and dry. Patient is oriented to time and place. Patient's medical history, psychosocial health, and medications reviewed. Psychosocial assessment reveals patient lives with alone. Monica White is currently retired. Patient hobbies include spending time with others and playing bridge. Patient reports her stress level is low. Areas of stress/anxiety include health. Patient does exhibit signs of depression. Signs of depression include difficulty falling asleep and difficulty maintaining sleep. PHQ2/9 score 0/5. Monica White shows good  coping skills with positive outlook on life. Offered emotional support and reassurance. Will continue to monitor. Physical assessment performed by Nurse pick: Monica Hart RN. Please see their orientation physical assessment note. Monica White reports she does take medications as prescribed. Patient states she follows a regular  diet. The patient would like to gain weight. Patient's weight will be monitored closely. Demonstration and practice of PLB using pulse oximeter. Monica White able to return demonstration satisfactorily. Safety and hand hygiene in the exercise area reviewed with patient. Monica White voices understanding of the information reviewed. Department expectations discussed with patient and achievable goals were set. The patient shows enthusiasm about attending the program and we look forward to working with Monica White. Monica White completed a 6 min walk test today and is scheduled to begin exercise on 03/10/24 @10 :15.   [phone] Monica White, BSRT

## 2024-03-02 NOTE — Progress Notes (Signed)
 Pulmonary Individual Treatment Plan  Patient Details  Name: Monica White MRN: [id number] Date of Birth: 06-27-1941 Referring Provider:   Doristine Devoid Pulmonary Rehab Walk Test from 03/02/2024 in [facility]  Referring Provider Klapper  [Ellison]       Initial Encounter Date:  Flowsheet Row Pulmonary Rehab Walk Test from 03/02/2024 in [facility]  Date 03/02/24       Visit Diagnosis: Shortness of breath  Patient's Home Medications on Admission:   Current Outpatient Medications:    acetaminophen (TYLENOL) 325 MG tablet, Take 2 tablets (650 mg total) by mouth every 6 (six) hours as needed. (Patient taking differently: Take 650 mg by mouth every 6 (six) hours as needed for mild pain (pain score 1-3) or headache.), Disp: , Rfl:    Ascorbic Acid (VITAMIN C PO), Take 1 tablet by mouth daily., Disp: , Rfl:    cholecalciferol (VITAMIN D3) 25 MCG (1000 UNIT) tablet, Take 1,000 Units by mouth daily., Disp: , Rfl:    Cyanocobalamin (B-12) 1000 MCG CAPS, Take 1,000 mcg by mouth daily., Disp: , Rfl:    desvenlafaxine (PRISTIQ) 25 MG 24 hr tablet, Take 25 mg by mouth in the morning., Disp: , Rfl:    estradiol (ESTRACE) 0.5 MG tablet, Take 0.5 mg by mouth daily., Disp: , Rfl:    levothyroxine (SYNTHROID) 75 MCG tablet, Take 75 mcg by mouth daily before breakfast., Disp: , Rfl:    medroxyPROGESTERone (PROVERA) 2.5 MG tablet, Take 1 tablet (2.5 mg total) by mouth daily., Disp: 90 tablet, Rfl: 3   Multiple Vitamins-Minerals (MULTIVITAMIN PO), Take 1 tablet by mouth daily with breakfast., Disp: , Rfl:    Probiotic Product (PROBIOTIC PO), Take 1 capsule by mouth in the morning., Disp: , Rfl:    ondansetron (ZOFRAN-ODT) 4 MG disintegrating tablet, Take 1 tablet (4 mg total) by mouth every 6 (six) hours as needed for nausea. (Patient not taking: Reported on 08/28/2023), Disp: 30 tablet, Rfl: 3    oxyCODONE (OXY IR/ROXICODONE) 5 MG immediate release tablet, Take 1 tablet (5 mg total) by mouth every 6 (six) hours as needed for breakthrough pain. (Patient not taking: Reported on 08/28/2023), Disp: 15 tablet, Rfl: 0   pantoprazole (PROTONIX) 40 MG tablet, Take 1 tablet (40 mg total) by mouth 2 (two) times daily. (Patient not taking: Reported on 08/28/2023), Disp: 60 tablet, Rfl: 0  Past Medical History: Past Medical History:  Diagnosis Date   Anemia    Arthritis    Dyspnea    GERD (gastroesophageal reflux disease)    History of hiatal hernia    Hypercholesteremia    Hypothyroid 2015   PONV (postoperative nausea and vomiting)     Tobacco Use: Social History   Tobacco Use  Smoking Status Never  Smokeless Tobacco Never    Labs: Review Flowsheet  More data exists      Latest Ref Rng & Units 03/26/2018 01/06/2021 12/01/2021 02/14/2023 02/18/2023  Labs for ITP Cardiac and Pulmonary Rehab  Cholestrol 100 - 199 mg/dL 086  578  - - -  LDL (calc) 0 - 99 mg/dL 90  469  - - -  HDL-C >62 mg/dL 64  57  - - -  Trlycerides <150 mg/dL 73  952  - 841  324   PH, Arterial 7.350 - 7.450 - - 7.376  - -  PCO2 arterial 32.0 - 48.0 mmHg - - 35.7  - -  Bicarbonate 20.0 - 28.0 mmol/L - - 20.9  21.6  - -  TCO2 22 - 32 mmol/L - - 22  23  - -  Acid-base deficit 0.0 - 2.0 mmol/L - - 4.0  4.0  - -  O2 Saturation % - - 97.0  70.0  - -    Details       Multiple values from one day are sorted in reverse-chronological order         Capillary Blood Glucose: Lab Results  Component Value Date   GLUCAP 117 (H) 02/16/2023   GLUCAP 166 (H) 02/16/2023   GLUCAP 123 (H) 02/16/2023   GLUCAP 138 (H) 02/15/2023   GLUCAP 146 (H) 02/14/2023     Pulmonary Assessment Scores:  Pulmonary Assessment Scores     Row Name 03/02/24 1013         ADL UCSD   ADL Phase Entry     SOB Score total 63       CAT Score   CAT Score 13       mMRC Score   mMRC Score 4             UCSD: Self-administered  rating of dyspnea associated with activities of daily living (ADLs) 6-point scale (0 = "not at all" to 5 = "maximal or unable to do because of breathlessness")  Scoring Scores range from 0 to 120.  Minimally important difference is 5 units  CAT: CAT can identify the health impairment of COPD patients and is better correlated with disease progression.  CAT has a scoring range of zero to 40. The CAT score is classified into four groups of low (less than 10), medium (10 - 20), high (21-30) and very high (31-40) based on the impact level of disease on health status. A CAT score over 10 suggests significant symptoms.  A worsening CAT score could be explained by an exacerbation, poor medication adherence, poor inhaler technique, or progression of COPD or comorbid conditions.  CAT MCID is 2 points  mMRC: mMRC (Modified Medical Research Council) Dyspnea Scale is used to assess the degree of baseline functional disability in patients of respiratory disease due to dyspnea. No minimal important difference is established. A decrease in score of 1 point or greater is considered a positive change.   Pulmonary Function Assessment:  Pulmonary Function Assessment - 03/02/24 1043       Breath   Bilateral Breath Sounds Clear             Exercise Target Goals: Exercise Program Goal: Individual exercise prescription set using results from initial 6 min walk test and THRR while considering  patient's activity barriers and safety.   Exercise Prescription Goal: Initial exercise prescription builds to 30-45 minutes a day of aerobic activity, 2-3 days per week.  Home exercise guidelines will be given to patient during program as part of exercise prescription that the participant will acknowledge.  Activity Barriers & Risk Stratification:  Activity Barriers & Cardiac Risk Stratification - 03/02/24 0926       Activity Barriers & Cardiac Risk Stratification   Activity Barriers Deconditioning;Muscular  Weakness;Shortness of Breath   No bending, twisting, or lifting   Cardiac Risk Stratification Moderate             6 Minute Walk:  6 Minute Walk     Row Name 03/02/24 1038         6 Minute Walk   Phase Initial     Distance 680 feet  Walk Time 6 minutes     # of Rest Breaks 1  2:15-3:30     MPH 1.29     METS 1.4     RPE 13     Perceived Dyspnea  1     VO2 Peak 4.88     Symptoms No     Resting HR 82 bpm     Resting BP 106/70     Resting Oxygen Saturation  98 %     Exercise Oxygen Saturation  during 6 min walk 93 %     Max Ex. HR 125 bpm     Max Ex. BP 104/70     2 Minute Post BP 108/70       Interval HR   1 Minute HR 108     2 Minute HR 109     3 Minute HR 110     4 Minute HR 114     5 Minute HR 123     6 Minute HR 125     2 Minute Post HR 103     Interval Heart Rate? Yes       Interval Oxygen   Interval Oxygen? Yes     Baseline Oxygen Saturation % 98 %     1 Minute Oxygen Saturation % 97 %     1 Minute Liters of Oxygen 0 L     2 Minute Oxygen Saturation % 93 %     2 Minute Liters of Oxygen 0 L     3 Minute Oxygen Saturation % 98 %     3 Minute Liters of Oxygen 0 L     4 Minute Oxygen Saturation % 99 %     4 Minute Liters of Oxygen 0 L     5 Minute Oxygen Saturation % 98 %     5 Minute Liters of Oxygen 0 L     6 Minute Oxygen Saturation % 96 %     6 Minute Liters of Oxygen 0 L     2 Minute Post Oxygen Saturation % 96 %     2 Minute Post Liters of Oxygen 0 L              Oxygen Initial Assessment:  Oxygen Initial Assessment - 03/02/24 0928       Home Oxygen   Home Oxygen Device None    Sleep Oxygen Prescription None    Home Exercise Oxygen Prescription None    Home Resting Oxygen Prescription None      Initial 6 min Walk   Oxygen Used None      Program Oxygen Prescription   Program Oxygen Prescription None      Intervention   Short Term Goals To learn and understand importance of maintaining oxygen saturations>88%;To learn and  understand importance of monitoring SPO2 with pulse oximeter and demonstrate accurate use of the pulse oximeter.;To learn and demonstrate proper pursed lip breathing techniques or other breathing techniques.     Long  Term Goals Exhibits proper breathing techniques, such as pursed lip breathing or other method taught during program session;Verbalizes importance of monitoring SPO2 with pulse oximeter and return demonstration;Maintenance of O2 saturations>88%             Oxygen Re-Evaluation:   Oxygen Discharge (Final Oxygen Re-Evaluation):   Initial Exercise Prescription:  Initial Exercise Prescription - 03/02/24 1000       Date of Initial Exercise RX and Referring Provider   Date 03/02/24    Referring  Provider Wendelyn Breslow   Expected Discharge Date 05/28/24      Recumbant Bike   Level 1    RPM 20    Watts 15    Minutes 15    METs 1.9      NuStep   Level 1    SPM 72    Minutes 15    METs 1.5      Prescription Details   Frequency (times per week) 2    Duration Progress to 30 minutes of continuous aerobic without signs/symptoms of physical distress      Intensity   THRR 40-80% of Max Heartrate 55-110    Ratings of Perceived Exertion 11-13    Perceived Dyspnea 0-4      Progression   Progression Continue progressive overload as per policy without signs/symptoms or physical distress.      Resistance Training   Training Prescription Yes    Weight red bands    Reps 10-15             Perform Capillary Blood Glucose checks as needed.  Exercise Prescription Changes:   Exercise Comments:   Exercise Goals and Review:   Exercise Goals     Row Name 03/02/24 0927             Exercise Goals   Increase Physical Activity Yes       Intervention Develop an individualized exercise prescription for aerobic and resistive training based on initial evaluation findings, risk stratification, comorbidities and participant's personal goals.;Provide advice,  education, support and counseling about physical activity/exercise needs.       Expected Outcomes Short Term: Attend rehab on a regular basis to increase amount of physical activity.;Long Term: Exercising regularly at least 3-5 days a week.;Long Term: Add in home exercise to make exercise part of routine and to increase amount of physical activity.       Increase Strength and Stamina Yes       Intervention Provide advice, education, support and counseling about physical activity/exercise needs.;Develop an individualized exercise prescription for aerobic and resistive training based on initial evaluation findings, risk stratification, comorbidities and participant's personal goals.       Expected Outcomes Short Term: Increase workloads from initial exercise prescription for resistance, speed, and METs.;Short Term: Perform resistance training exercises routinely during rehab and add in resistance training at home;Long Term: Improve cardiorespiratory fitness, muscular endurance and strength as measured by increased METs and functional capacity ( )       Able to understand and use rate of perceived exertion (RPE) scale Yes       Intervention Provide education and explanation on how to use RPE scale       Expected Outcomes Short Term: Able to use RPE daily in rehab to express subjective intensity level;Long Term:  Able to use RPE to guide intensity level when exercising independently       Able to understand and use Dyspnea scale Yes       Intervention Provide education and explanation on how to use Dyspnea scale       Expected Outcomes Short Term: Able to use Dyspnea scale daily in rehab to express subjective sense of shortness of breath during exertion;Long Term: Able to use Dyspnea scale to guide intensity level when exercising independently       Knowledge and understanding of Target Heart Rate Range (THRR) Yes       Intervention Provide education and explanation of THRR including how the numbers were  predicted and where they are located for reference       Expected Outcomes Short Term: Able to state/look up THRR;Short Term: Able to use daily as guideline for intensity in rehab;Long Term: Able to use THRR to govern intensity when exercising independently       Understanding of Exercise Prescription Yes       Intervention Provide education, explanation, and written materials on patient's individual exercise prescription       Expected Outcomes Short Term: Able to explain program exercise prescription;Long Term: Able to explain home exercise prescription to exercise independently                Exercise Goals Re-Evaluation :   Discharge Exercise Prescription (Final Exercise Prescription Changes):   Nutrition:  Target Goals: Understanding of nutrition guidelines, daily intake of sodium 1500mg , cholesterol 200mg , calories 30% from fat and 7% or less from saturated fats, daily to have 5 or more servings of fruits and vegetables.  Biometrics:  Pre Biometrics - 03/02/24 0916       Pre Biometrics   Grip Strength 15 kg              Nutrition Therapy Plan and Nutrition Goals:   Nutrition Assessments:  MEDIFICTS Score Key: >=70 Need to make dietary changes  40-70 Heart Healthy Diet <= 40 Therapeutic Level Cholesterol Diet   Picture Your Plate Scores: <16 Unhealthy dietary pattern with much room for improvement. 41-50 Dietary pattern unlikely to meet recommendations for good health and room for improvement. 51-60 More healthful dietary pattern, with some room for improvement.  >60 Healthy dietary pattern, although there may be some specific behaviors that could be improved.    Nutrition Goals Re-Evaluation:   Nutrition Goals Discharge (Final Nutrition Goals Re-Evaluation):   Psychosocial: Target Goals: Acknowledge presence or absence of significant depression and/or stress, maximize coping skills, provide positive support system. Participant is able to verbalize  types and ability to use techniques and skills needed for reducing stress and depression.  Initial Review & Psychosocial Screening:  Initial Psych Review & Screening - 03/02/24 0930       Initial Review   Current issues with Current Sleep Concerns;Current Psychotropic Meds;History of Depression      Family Dynamics   Good Support System? Yes      Barriers   Psychosocial barriers to participate in program The patient should benefit from training in stress management and relaxation.      Screening Interventions   Interventions Encouraged to exercise;Provide feedback about the scores to participant    Expected Outcomes Short Term goal: Utilizing psychosocial counselor, staff and physician to assist with identification of specific Stressors or current issues interfering with healing process. Setting desired goal for each stressor or current issue identified.;Long Term Goal: Stressors or current issues are controlled or eliminated.;Short Term goal: Identification and review with participant of any Quality of Life or Depression concerns found by scoring the questionnaire.;Long Term goal: The participant improves quality of Life and PHQ9 Scores as seen by post scores and/or verbalization of changes             Quality of Life Scores:  Scores of 19 and below usually indicate a poorer quality of life in these areas.  A difference of  2-3 points is a clinically meaningful difference.  A difference of 2-3 points in the total score of the Quality of Life Index has been associated with significant improvement in overall quality of life, self-image, physical symptoms, and general  health in studies assessing change in quality of life.  PHQ-9: Review Flowsheet       03/02/2024  Depression screen PHQ 2/9  Decreased Interest 0  Down, Depressed, Hopeless 0  PHQ - 2 Score 0  Altered sleeping 2  Tired, decreased energy 1  Change in appetite 1  Feeling bad or failure about yourself  0  Trouble  concentrating 1  Moving slowly or fidgety/restless 0  Suicidal thoughts 0  PHQ-9 Score 5  Difficult doing work/chores Somewhat difficult   Interpretation of Total Score  Total Score Depression Severity:  1-4 = Minimal depression, 5-9 = Mild depression, 10-14 = Moderate depression, 15-19 = Moderately severe depression, 20-27 = Severe depression   Psychosocial Evaluation and Intervention:  Psychosocial Evaluation - 03/02/24 1039       Psychosocial Evaluation & Interventions   Interventions Encouraged to exercise with the program and follow exercise prescription;Relaxation education    Comments Raha is currently having issues with her sleep. She has trouble falling and staying asleep. Emaan does have a history of depression, but states she feels it is controlled with meds at this time. She denied needing a referral to a therapist.  Staff will provide [place] with relaxation techniques to help with her sleep.    Expected Outcomes For Kerilyn to participate in PR free from any psychosocial barriers or concerns    Continue Psychosocial Services  No Follow up required             Psychosocial Re-Evaluation:   Psychosocial Discharge (Final Psychosocial Re-Evaluation):   Education: Education Goals: Education classes will be provided on a weekly basis, covering required topics. Participant will state understanding/return demonstration of topics presented.  Learning Barriers/Preferences:  Learning Barriers/Preferences - 03/02/24 0934       Learning Barriers/Preferences   Learning Barriers Exercise Concerns    Learning Preferences None             Education Topics: Know Your Numbers Group instruction that is supported by a PowerPoint presentation. Instructor discusses importance of knowing and understanding resting, exercise, and post-exercise oxygen saturation, heart rate, and blood pressure. Oxygen saturation, heart rate, blood pressure, rating of perceived exertion, and  dyspnea are reviewed along with a normal range for these values.    Exercise for the Pulmonary Patient Group instruction that is supported by a PowerPoint presentation. Instructor discusses benefits of exercise, core components of exercise, frequency, duration, and intensity of an exercise routine, importance of utilizing pulse oximetry during exercise, safety while exercising, and options of places to exercise outside of rehab.    MET Level  Group instruction provided by PowerPoint, verbal discussion, and written material to support subject matter. Instructor reviews what METs are and how to increase METs.    Pulmonary Medications Verbally interactive group education provided by instructor with focus on inhaled medications and proper administration.   Anatomy and Physiology of the Respiratory System Group instruction provided by PowerPoint, verbal discussion, and written material to support subject matter. Instructor reviews respiratory cycle and anatomical components of the respiratory system and their functions. Instructor also reviews differences in obstructive and restrictive respiratory diseases with examples of each.    Oxygen Safety Group instruction provided by PowerPoint, verbal discussion, and written material to support subject matter. There is an overview of "What is Oxygen" and "Why do we need it".  Instructor also reviews how to create a safe environment for oxygen use, the importance of using oxygen as prescribed, and the risks of noncompliance. There  is a brief discussion on traveling with oxygen and resources the patient may utilize.   Oxygen Use Group instruction provided by PowerPoint, verbal discussion, and written material to discuss how supplemental oxygen is prescribed and different types of oxygen supply systems. Resources for more information are provided.    Breathing Techniques Group instruction that is supported by demonstration and informational handouts.  Instructor discusses the benefits of pursed lip and diaphragmatic breathing and detailed demonstration on how to perform both.     Risk Factor Reduction Group instruction that is supported by a PowerPoint presentation. Instructor discusses the definition of a risk factor, different risk factors for pulmonary disease, and how the heart and lungs work together.   Pulmonary Diseases Group instruction provided by PowerPoint, verbal discussion, and written material to support subject matter. Instructor gives an overview of the different type of pulmonary diseases. There is also a discussion on risk factors and symptoms as well as ways to manage the diseases.   Stress and Energy Conservation Group instruction provided by PowerPoint, verbal discussion, and written material to support subject matter. Instructor gives an overview of stress and the impact it can have on the body. Instructor also reviews ways to reduce stress. There is also a discussion on energy conservation and ways to conserve energy throughout the day.   Warning Signs and Symptoms Group instruction provided by PowerPoint, verbal discussion, and written material to support subject matter. Instructor reviews warning signs and symptoms of stroke, heart attack, cold and flu. Instructor also reviews ways to prevent the spread of infection.   Other Education Group or individual verbal, written, or video instructions that support the educational goals of the pulmonary rehab program.    Knowledge Questionnaire Score:  Knowledge Questionnaire Score - 03/02/24 1014       Knowledge Questionnaire Score   Pre Score 17/18             Core Components/Risk Factors/Patient Goals at Admission:  Personal Goals and Risk Factors at Admission - 03/02/24 0935       Core Components/Risk Factors/Patient Goals on Admission    Weight Management Yes;Weight Gain    Intervention Weight Management: Develop a combined nutrition and exercise  program designed to reach desired caloric intake, while maintaining appropriate intake of nutrient and fiber, sodium and fats, and appropriate energy expenditure required for the weight goal.;Weight Management: Provide education and appropriate resources to help participant work on and attain dietary goals.;Weight Management/Obesity: Establish reasonable short term and long term weight goals.;Obesity: Provide education and appropriate resources to help participant work on and attain dietary goals.    Expected Outcomes Short Term: Continue to assess and modify interventions until short term weight is achieved;Long Term: Adherence to nutrition and physical activity/exercise program aimed toward attainment of established weight goal;Weight Gain: Understanding of general recommendations for a high calorie, high protein meal plan that promotes weight gain by distributing calorie intake throughout the day with the consumption for 4-5 meals, snacks, and/or supplements;Understanding recommendations for meals to include 15-35% energy as protein, 25-35% energy from fat, 35-60% energy from carbohydrates, less than 200mg  of dietary cholesterol, 20-35 gm of total fiber daily;Understanding of distribution of calorie intake throughout the day with the consumption of 4-5 meals/snacks    Improve shortness of breath with ADL's Yes    Intervention Provide education, individualized exercise plan and daily activity instruction to help decrease symptoms of SOB with activities of daily living.    Expected Outcomes Short Term: Improve cardiorespiratory fitness to achieve  a reduction of symptoms when performing ADLs;Long Term: Be able to perform more ADLs without symptoms or delay the onset of symptoms             Core Components/Risk Factors/Patient Goals Review:    Core Components/Risk Factors/Patient Goals at Discharge (Final Review):    ITP Comments:   Comments: Dr. Mechele Collin is Medical Director for Pulmonary  Rehab at [facility].

## 2024-03-03 ENCOUNTER — Encounter (HOSPITAL_COMMUNITY): Payer: Self-pay

## 2024-03-03 NOTE — Progress Notes (Signed)
 Pulmonary Rehab Orientation Physical Assessment Note    Well appearing, A&Ox4, NAD Eyes/Ears: WNL Lungs: Clear with no wheezes, rales, rhonchi, denies chronic cough, dyspnea on exertion Heart: Regular rate rhythm, no murmurs, no rubs, no clicks Gastrointestinal: abdomin soft, + bowel sounds in all 4 quads, denies recent weight gain or loss, endorses normal BMs Genitourinary: WNL, pt denies s/s Extremities:  +2 pulses, grip strength equal, strong, no edema, no cyanosis, no clubbing Integumentary: pt denies any rashes, open or non healing wounds Psy/Soc: Pt denies, PHQ 2 & 9 scores 0 & 5. Pt states that her husband passed away 3 years ago, debating whether to sell her house. Currently living alone, has son for support, dgt also supportive but lives out of town. Technology also stresses her out. She states she doesn't like texting, using the computer, or MyChart. Also stated that taxes have stressed her out, her son is helping her. Declines a referral to a mental health professional.  Assistive devices: Pt has rolling walker and shower seat from discharge from SNF post surgery.

## 2024-03-10 ENCOUNTER — Encounter (HOSPITAL_COMMUNITY)
Admission: RE | Admit: 2024-03-10 | Discharge: 2024-03-10 | Disposition: A | Discharge status: Home / Self Care | Source: Ambulatory Visit | Attending: Thoracic Surgery | Admitting: Thoracic Surgery

## 2024-03-10 VITALS — Wt 128.5 lb

## 2024-03-10 DIAGNOSIS — R0602 Shortness of breath: Secondary | ICD-10-CM

## 2024-03-10 NOTE — Progress Notes (Signed)
 Daily Session Note  Patient Details  Name: Monica White MRN: [id number] Date of Birth: Jun 26, 1941 Referring Provider:   [name] Pulmonary Rehab Walk Test from 03/02/2024 in [facility]  Referring Provider [name]       Encounter Date: 03/10/2024  Check In:  Session Check In - 03/10/24 1026       Check-In   Supervising physician immediately available to respond to emergencies [facility] MD immediately available    Physician(s) [name], NP    Location [facility]    Staff Present Willard Harman, RN, Shasta Deist, MS, ACSM-CEP, Exercise Physiologist;Randi Regis Captain BS, ACSM-CEP, Exercise Physiologist;Casey Felipe Horton, RT    Virtual Visit No    Medication changes reported     No    Fall or balance concerns reported    No    Tobacco Cessation No Change    Warm-up and Cool-down Performed as group-led instruction    Resistance Training Performed Yes    VAD Patient? No    PAD/SET Patient? No      Pain Assessment   Currently in Pain? No/denies    Multiple Pain Sites No             Capillary Blood Glucose: No results found for this or any previous visit (from the past 24 hours).   Exercise Prescription Changes - 03/10/24 1100       Response to Exercise   Blood Pressure (Admit) 123/83    Blood Pressure (Exercise) 130/80    Blood Pressure (Exit) 108/70    Heart Rate (Admit) 88 bpm    Heart Rate (Exercise) 87 bpm    Heart Rate (Exit) 88 bpm    Oxygen Saturation (Admit) 98 %    Oxygen Saturation (Exercise) 96 %    Oxygen Saturation (Exit) 98 %    Rating of Perceived Exertion (Exercise) 9    Perceived Dyspnea (Exercise) 0    Duration Progress to 30 minutes of  aerobic without signs/symptoms of physical distress    Intensity THRR unchanged      Progression   Progression Continue to progress workloads to maintain intensity without signs/symptoms of physical distress.      Resistance  Training   Training Prescription Yes    Weight red bands    Reps 10-15    Time 10 Minutes      Recumbant Bike   Level 1    Minutes 15    METs 1.9      NuStep   Level 1    SPM 73    Minutes 15    METs 2.1             Social History   Tobacco Use  Smoking Status Never  Smokeless Tobacco Never    Goals Met:  Proper associated with RPD/PD & O2 Sat Exercise tolerated well No report of concerns or symptoms today Strength training completed today  Goals Unmet:  Not Applicable  Comments: Service time is from 1013 to 1140.    Dr. [name] is Medical Director for Pulmonary Rehab at [facility].

## 2024-03-11 ENCOUNTER — Telehealth (HOSPITAL_COMMUNITY): Payer: Self-pay

## 2024-03-11 NOTE — Telephone Encounter (Signed)
 Attempted to call patient regarding insurance verification, left message. Her supplemental insurance is coming up inactive, and we now show only Medicare A/B as her primary. Unless she has a new supplemental insurance she will be responsible for 20% coinsurance (generally $20-25 through Medicare B).  We will need to verify her insurance when she comes in for class tomorrow 4/17.

## 2024-03-11 NOTE — Progress Notes (Signed)
 Pulmonary Individual Treatment Plan  Patient Details  Name: Monica White MRN: [id number] Date of Birth: 01/18/1941 Referring Provider:   Doristine Devoid Pulmonary Rehab Walk Test from 03/02/2024 in [facility]  Referring Provider Klapper  [Ellison]       Initial Encounter Date:  Flowsheet Row Pulmonary Rehab Walk Test from 03/02/2024 in [facility]  Date 03/02/24       Visit Diagnosis: Shortness of breath  Patient's Home Medications on Admission:   Current Outpatient Medications:    acetaminophen (TYLENOL) 325 MG tablet, Take 2 tablets (650 mg total) by mouth every 6 (six) hours as needed. (Patient taking differently: Take 650 mg by mouth every 6 (six) hours as needed for mild pain (pain score 1-3) or headache.), Disp: , Rfl:    Ascorbic Acid (VITAMIN C PO), Take 1 tablet by mouth daily., Disp: , Rfl:    cholecalciferol (VITAMIN D3) 25 MCG (1000 UNIT) tablet, Take 1,000 Units by mouth daily., Disp: , Rfl:    Cyanocobalamin (B-12) 1000 MCG CAPS, Take 1,000 mcg by mouth daily., Disp: , Rfl:    desvenlafaxine (PRISTIQ) 25 MG 24 hr tablet, Take 25 mg by mouth in the morning., Disp: , Rfl:    estradiol (ESTRACE) 0.5 MG tablet, Take 0.5 mg by mouth daily., Disp: , Rfl:    levothyroxine (SYNTHROID) 75 MCG tablet, Take 75 mcg by mouth daily before breakfast., Disp: , Rfl:    medroxyPROGESTERone (PROVERA) 2.5 MG tablet, Take 1 tablet (2.5 mg total) by mouth daily., Disp: 90 tablet, Rfl: 3   Multiple Vitamins-Minerals (MULTIVITAMIN PO), Take 1 tablet by mouth daily with breakfast., Disp: , Rfl:    ondansetron (ZOFRAN-ODT) 4 MG disintegrating tablet, Take 1 tablet (4 mg total) by mouth every 6 (six) hours as needed for nausea. (Patient not taking: Reported on 08/28/2023), Disp: 30 tablet, Rfl: 3   oxyCODONE (OXY IR/ROXICODONE) 5 MG immediate release tablet, Take 1 tablet (5 mg total) by  mouth every 6 (six) hours as needed for breakthrough pain. (Patient not taking: Reported on 08/28/2023), Disp: 15 tablet, Rfl: 0   pantoprazole (PROTONIX) 40 MG tablet, Take 1 tablet (40 mg total) by mouth 2 (two) times daily. (Patient not taking: Reported on 08/28/2023), Disp: 60 tablet, Rfl: 0   Probiotic Product (PROBIOTIC PO), Take 1 capsule by mouth in the morning., Disp: , Rfl:   Past Medical History: Past Medical History:  Diagnosis Date   Anemia    Arthritis    Dyspnea    GERD (gastroesophageal reflux disease)    History of hiatal hernia    Hypercholesteremia    Hypothyroid 2015   PONV (postoperative nausea and vomiting)     Tobacco Use: Social History   Tobacco Use  Smoking Status Never  Smokeless Tobacco Never    Labs: Review Flowsheet  More data exists      Latest Ref Rng & Units 03/26/2018 01/06/2021 12/01/2021 02/14/2023 02/18/2023  Labs for ITP Cardiac and Pulmonary Rehab  Cholestrol 100 - 199 mg/dL 696  295  - - -  LDL (calc) 0 - 99 mg/dL 90  284  - - -  HDL-C >13 mg/dL 64  57  - - -  Trlycerides <150 mg/dL 73  244  - 010  272   PH, Arterial 7.350 - 7.450 - - 7.376  - -  PCO2 arterial 32.0 - 48.0 mmHg - - 35.7  - -  Bicarbonate 20.0 - 28.0 mmol/L - - 20.9  21.6  - -  TCO2 22 - 32 mmol/L - - 22  23  - -  Acid-base deficit 0.0 - 2.0 mmol/L - - 4.0  4.0  - -  O2 Saturation % - - 97.0  70.0  - -    Details       Multiple values from one day are sorted in reverse-chronological order         Capillary Blood Glucose: Lab Results  Component Value Date   GLUCAP 117 (H) 02/16/2023   GLUCAP 166 (H) 02/16/2023   GLUCAP 123 (H) 02/16/2023   GLUCAP 138 (H) 02/15/2023   GLUCAP 146 (H) 02/14/2023     Pulmonary Assessment Scores:  Pulmonary Assessment Scores     Row Name 03/02/24 1013         ADL UCSD   ADL Phase Entry     SOB Score total 63       CAT Score   CAT Score 13       mMRC Score   mMRC Score 4             UCSD: Self-administered  rating of dyspnea associated with activities of daily living (ADLs) 6-point scale (0 = "not at all" to 5 = "maximal or unable to do because of breathlessness")  Scoring Scores range from 0 to 120.  Minimally important difference is 5 units  CAT: CAT can identify the health impairment of COPD patients and is better correlated with disease progression.  CAT has a scoring range of zero to 40. The CAT score is classified into four groups of low (less than 10), medium (10 - 20), high (21-30) and very high (31-40) based on the impact level of disease on health status. A CAT score over 10 suggests significant symptoms.  A worsening CAT score could be explained by an exacerbation, poor medication adherence, poor inhaler technique, or progression of COPD or comorbid conditions.  CAT MCID is 2 points  mMRC: mMRC (Modified Medical Research Council) Dyspnea Scale is used to assess the degree of baseline functional disability in patients of respiratory disease due to dyspnea. No minimal important difference is established. A decrease in score of 1 point or greater is considered a positive change.   Pulmonary Function Assessment:  Pulmonary Function Assessment - 03/02/24 1043       Breath   Bilateral Breath Sounds Clear             Exercise Target Goals: Exercise Program Goal: Individual exercise prescription set using results from initial 6 min walk test and THRR while considering  patient's activity barriers and safety.   Exercise Prescription Goal: Initial exercise prescription builds to 30-45 minutes a day of aerobic activity, 2-3 days per week.  Home exercise guidelines will be given to patient during program as part of exercise prescription that the participant will acknowledge.  Activity Barriers & Risk Stratification:  Activity Barriers & Cardiac Risk Stratification - 03/02/24 0926       Activity Barriers & Cardiac Risk Stratification   Activity Barriers Deconditioning;Muscular  Weakness;Shortness of Breath   No bending, twisting, or lifting   Cardiac Risk Stratification Moderate             6 Minute Walk:  6 Minute Walk     Row Name 03/02/24 1038         6 Minute Walk   Phase Initial     Distance 680 feet  Walk Time 6 minutes     # of Rest Breaks 1  2:15-3:30     MPH 1.29     METS 1.4     RPE 13     Perceived Dyspnea  1     VO2 Peak 4.88     Symptoms No     Resting HR 82 bpm     Resting BP 106/70     Resting Oxygen Saturation  98 %     Exercise Oxygen Saturation  during 6 min walk 93 %     Max Ex. HR 125 bpm     Max Ex. BP 104/70     2 Minute Post BP 108/70       Interval HR   1 Minute HR 108     2 Minute HR 109     3 Minute HR 110     4 Minute HR 114     5 Minute HR 123     6 Minute HR 125     2 Minute Post HR 103     Interval Heart Rate? Yes       Interval Oxygen   Interval Oxygen? Yes     Baseline Oxygen Saturation % 98 %     1 Minute Oxygen Saturation % 97 %     1 Minute Liters of Oxygen 0 L     2 Minute Oxygen Saturation % 93 %     2 Minute Liters of Oxygen 0 L     3 Minute Oxygen Saturation % 98 %     3 Minute Liters of Oxygen 0 L     4 Minute Oxygen Saturation % 99 %     4 Minute Liters of Oxygen 0 L     5 Minute Oxygen Saturation % 98 %     5 Minute Liters of Oxygen 0 L     6 Minute Oxygen Saturation % 96 %     6 Minute Liters of Oxygen 0 L     2 Minute Post Oxygen Saturation % 96 %     2 Minute Post Liters of Oxygen 0 L              Oxygen Initial Assessment:  Oxygen Initial Assessment - 03/02/24 0928       Home Oxygen   Home Oxygen Device None    Sleep Oxygen Prescription None    Home Exercise Oxygen Prescription None    Home Resting Oxygen Prescription None      Initial 6 min Walk   Oxygen Used None      Program Oxygen Prescription   Program Oxygen Prescription None      Intervention   Short Term Goals To learn and understand importance of maintaining oxygen saturations>88%;To learn and  understand importance of monitoring SPO2 with pulse oximeter and demonstrate accurate use of the pulse oximeter.;To learn and demonstrate proper pursed lip breathing techniques or other breathing techniques.     Long  Term Goals Exhibits proper breathing techniques, such as pursed lip breathing or other method taught during program session;Verbalizes importance of monitoring SPO2 with pulse oximeter and return demonstration;Maintenance of O2 saturations>88%             Oxygen Re-Evaluation:  Oxygen Re-Evaluation     Row Name 03/04/24 1110             Program Oxygen Prescription   Program Oxygen Prescription None         Home  Oxygen   Home Oxygen Device None       Sleep Oxygen Prescription None       Home Exercise Oxygen Prescription None       Home Resting Oxygen Prescription None         Goals/Expected Outcomes   Short Term Goals To learn and understand importance of maintaining oxygen saturations>88%;To learn and understand importance of monitoring SPO2 with pulse oximeter and demonstrate accurate use of the pulse oximeter.;To learn and demonstrate proper pursed lip breathing techniques or other breathing techniques.        Long  Term Goals Exhibits proper breathing techniques, such as pursed lip breathing or other method taught during program session;Verbalizes importance of monitoring SPO2 with pulse oximeter and return demonstration;Maintenance of O2 saturations>88%       Goals/Expected Outcomes Compliance and understanding of oxygen saturation and breathing techniques to decrease shortness of breath.                Oxygen Discharge (Final Oxygen Re-Evaluation):  Oxygen Re-Evaluation - 03/04/24 1110       Program Oxygen Prescription   Program Oxygen Prescription None      Home Oxygen   Home Oxygen Device None    Sleep Oxygen Prescription None    Home Exercise Oxygen Prescription None    Home Resting Oxygen Prescription None      Goals/Expected Outcomes   Short  Term Goals To learn and understand importance of maintaining oxygen saturations>88%;To learn and understand importance of monitoring SPO2 with pulse oximeter and demonstrate accurate use of the pulse oximeter.;To learn and demonstrate proper pursed lip breathing techniques or other breathing techniques.     Long  Term Goals Exhibits proper breathing techniques, such as pursed lip breathing or other method taught during program session;Verbalizes importance of monitoring SPO2 with pulse oximeter and return demonstration;Maintenance of O2 saturations>88%    Goals/Expected Outcomes Compliance and understanding of oxygen saturation and breathing techniques to decrease shortness of breath.             Initial Exercise Prescription:  Initial Exercise Prescription - 03/02/24 1000       Date of Initial Exercise RX and Referring Provider   Date 03/02/24    Referring Provider Wendelyn Breslow   Expected Discharge Date 05/28/24      Recumbant Bike   Level 1    RPM 20    Watts 15    Minutes 15    METs 1.9      NuStep   Level 1    SPM 72    Minutes 15    METs 1.5      Prescription Details   Frequency (times per week) 2    Duration Progress to 30 minutes of continuous aerobic without signs/symptoms of physical distress      Intensity   THRR 40-80% of Max Heartrate 55-110    Ratings of Perceived Exertion 11-13    Perceived Dyspnea 0-4      Progression   Progression Continue progressive overload as per policy without signs/symptoms or physical distress.      Resistance Training   Training Prescription Yes    Weight red bands    Reps 10-15             Perform Capillary Blood Glucose checks as needed.  Exercise Prescription Changes:   Exercise Prescription Changes     Row Name 03/10/24 1100  Response to Exercise   Blood Pressure (Admit) 123/83       Blood Pressure (Exercise) 130/80       Blood Pressure (Exit) 108/70       Heart Rate (Admit) 88 bpm        Heart Rate (Exercise) 87 bpm       Heart Rate (Exit) 88 bpm       Oxygen Saturation (Admit) 98 %       Oxygen Saturation (Exercise) 96 %       Oxygen Saturation (Exit) 98 %       Rating of Perceived Exertion (Exercise) 9       Perceived Dyspnea (Exercise) 0       Duration Progress to 30 minutes of  aerobic without signs/symptoms of physical distress       Intensity THRR unchanged         Progression   Progression Continue to progress workloads to maintain intensity without signs/symptoms of physical distress.         Resistance Training   Training Prescription Yes       Weight red bands       Reps 10-15       Time 10 Minutes         Recumbant Bike   Level 1       Minutes 15       METs 1.9         NuStep   Level 1       SPM 73       Minutes 15       METs 2.1                Exercise Comments:   Exercise Comments     Row Name 03/10/24 1156           Exercise Comments Pt completed first day of exercise. She exercised for 15 min on the recumbent bike and Nustep. Jahniya averaged 1.9 METs at level 1 on the recumbent bike and 2.1 METs at level 1 on the Nustep. She performed the warmup and coodown standing without limitations. Discussed METs.                Exercise Goals and Review:   Exercise Goals     Row Name 03/02/24 0927             Exercise Goals   Increase Physical Activity Yes       Intervention Develop an individualized exercise prescription for aerobic and resistive training based on initial evaluation findings, risk stratification, comorbidities and participant's personal goals.;Provide advice, education, support and counseling about physical activity/exercise needs.       Expected Outcomes Short Term: Attend rehab on a regular basis to increase amount of physical activity.;Long Term: Exercising regularly at least 3-5 days a week.;Long Term: Add in home exercise to make exercise part of routine and to increase amount of physical activity.        Increase Strength and Stamina Yes       Intervention Provide advice, education, support and counseling about physical activity/exercise needs.;Develop an individualized exercise prescription for aerobic and resistive training based on initial evaluation findings, risk stratification, comorbidities and participant's personal goals.       Expected Outcomes Short Term: Increase workloads from initial exercise prescription for resistance, speed, and METs.;Short Term: Perform resistance training exercises routinely during rehab and add in resistance training at home;Long Term: Improve cardiorespiratory fitness, muscular endurance and strength as  measured by increased METs and functional capacity ( )       Able to understand and use rate of perceived exertion (RPE) scale Yes       Intervention Provide education and explanation on how to use RPE scale       Expected Outcomes Short Term: Able to use RPE daily in rehab to express subjective intensity level;Long Term:  Able to use RPE to guide intensity level when exercising independently       Able to understand and use Dyspnea scale Yes       Intervention Provide education and explanation on how to use Dyspnea scale       Expected Outcomes Short Term: Able to use Dyspnea scale daily in rehab to express subjective sense of shortness of breath during exertion;Long Term: Able to use Dyspnea scale to guide intensity level when exercising independently       Knowledge and understanding of Target Heart Rate Range (THRR) Yes       Intervention Provide education and explanation of THRR including how the numbers were predicted and where they are located for reference       Expected Outcomes Short Term: Able to state/look up THRR;Short Term: Able to use daily as guideline for intensity in rehab;Long Term: Able to use THRR to govern intensity when exercising independently       Understanding of Exercise Prescription Yes       Intervention Provide education,  explanation, and written materials on patient's individual exercise prescription       Expected Outcomes Short Term: Able to explain program exercise prescription;Long Term: Able to explain home exercise prescription to exercise independently                Exercise Goals Re-Evaluation :  Exercise Goals Re-Evaluation     Row Name 03/04/24 1108             Exercise Goal Re-Evaluation   Exercise Goals Review Increase Physical Activity;Able to understand and use Dyspnea scale;Understanding of Exercise Prescription;Increase Strength and Stamina;Knowledge and understanding of Target Heart Rate Range (THRR);Able to understand and use rate of perceived exertion (RPE) scale       Comments Phoenix is scheduled to begin exercise on 4/15. Will continue to monitor and progress as able.       Expected Outcomes Through exercise at rehab and home, the patient will decrease shortness of breath and feel confident in carrying out an exercise regimen at home.                Discharge Exercise Prescription (Final Exercise Prescription Changes):  Exercise Prescription Changes - 03/10/24 1100       Response to Exercise   Blood Pressure (Admit) 123/83    Blood Pressure (Exercise) 130/80    Blood Pressure (Exit) 108/70    Heart Rate (Admit) 88 bpm    Heart Rate (Exercise) 87 bpm    Heart Rate (Exit) 88 bpm    Oxygen Saturation (Admit) 98 %    Oxygen Saturation (Exercise) 96 %    Oxygen Saturation (Exit) 98 %    Rating of Perceived Exertion (Exercise) 9    Perceived Dyspnea (Exercise) 0    Duration Progress to 30 minutes of  aerobic without signs/symptoms of physical distress    Intensity THRR unchanged      Progression   Progression Continue to progress workloads to maintain intensity without signs/symptoms of physical distress.      Paramedic Prescription  Yes    Weight red bands    Reps 10-15    Time 10 Minutes      Recumbant Bike   Level 1    Minutes 15     METs 1.9      NuStep   Level 1    SPM 73    Minutes 15    METs 2.1             Nutrition:  Target Goals: Understanding of nutrition guidelines, daily intake of sodium 1500mg , cholesterol 200mg , calories 30% from fat and 7% or less from saturated fats, daily to have 5 or more servings of fruits and vegetables.  Biometrics:  Pre Biometrics - 03/02/24 0916       Pre Biometrics   Grip Strength 15 kg              Nutrition Therapy Plan and Nutrition Goals:  Nutrition Therapy & Goals - 03/10/24 1201       Nutrition Therapy   Diet General Healthy Diet      Personal Nutrition Goals   Nutrition Goal Patient to improve diet quality by using the plate method as a guide for meal planning to include lean protein/plant protein, fruits, vegetables, whole grains, nonfat dairy as part of a well-balanced diet.    Personal Goal #2 Patient to identify strategies for weight gain of 0.5-2.0# per week.    Comments Baylen has medical history of shortness of breath, mulitple hiatal hernia repair, gtube removal 01/2023. She did recently have another hernia surgery in 12/2023 and was discharged to SNF on a full liquid diet. At this time, she has advanced to a regular diet. Will continue to montior weight. She is motivated to gain weight; will continue to discuss strategies for weight gain. Patient will continue to benefit from participation in pulmonary rehab for nutrition, exercise, and lifestyle modification support.      Intervention Plan   Intervention Prescribe, educate and counsel regarding individualized specific dietary modifications aiming towards targeted core components such as weight, hypertension, lipid management, diabetes, heart failure and other comorbidities.;Nutrition handout(s) given to patient.    Expected Outcomes Short Term Goal: Understand basic principles of dietary content, such as calories, fat, sodium, cholesterol and nutrients.;Long Term Goal: Adherence to prescribed  nutrition plan.             Nutrition Assessments:  MEDIFICTS Score Key: >=70 Need to make dietary changes  40-70 Heart Healthy Diet <= 40 Therapeutic Level Cholesterol Diet   Picture Your Plate Scores: <40 Unhealthy dietary pattern with much room for improvement. 41-50 Dietary pattern unlikely to meet recommendations for good health and room for improvement. 51-60 More healthful dietary pattern, with some room for improvement.  >60 Healthy dietary pattern, although there may be some specific behaviors that could be improved.    Nutrition Goals Re-Evaluation:  Nutrition Goals Re-Evaluation     Row Name 03/10/24 1201             Goals   Current Weight 127 lb 13.9 oz (58 kg)       Expected Outcome Petrea has medical history of shortness of breath, mulitple hiatal hernia repair, gtube removal 01/2023. She did recently have another hernia surgery in 12/2023 and was discharged to SNF on a full liquid diet. At this time, she has advanced to a regular diet. Will continue to montior weight. She is motivated to gain weight; will continue to discuss strategies for weight gain. Patient will continue to benefit  from participation in pulmonary rehab for nutrition, exercise, and lifestyle modification support.                Nutrition Goals Discharge (Final Nutrition Goals Re-Evaluation):  Nutrition Goals Re-Evaluation - 03/10/24 1201       Goals   Current Weight 127 lb 13.9 oz (58 kg)    Expected Outcome Katryna has medical history of shortness of breath, mulitple hiatal hernia repair, gtube removal 01/2023. She did recently have another hernia surgery in 12/2023 and was discharged to SNF on a full liquid diet. At this time, she has advanced to a regular diet. Will continue to montior weight. She is motivated to gain weight; will continue to discuss strategies for weight gain. Patient will continue to benefit from participation in pulmonary rehab for nutrition, exercise, and lifestyle  modification support.             Psychosocial: Target Goals: Acknowledge presence or absence of significant depression and/or stress, maximize coping skills, provide positive support system. Participant is able to verbalize types and ability to use techniques and skills needed for reducing stress and depression.  Initial Review & Psychosocial Screening:  Initial Psych Review & Screening - 03/02/24 0930       Initial Review   Current issues with Current Sleep Concerns      Family Dynamics   Good Support System? Yes      Barriers   Psychosocial barriers to participate in program The patient should benefit from training in stress management and relaxation.      Screening Interventions   Interventions Encouraged to exercise;Provide feedback about the scores to participant    Expected Outcomes Short Term goal: Utilizing psychosocial counselor, staff and physician to assist with identification of specific Stressors or current issues interfering with healing process. Setting desired goal for each stressor or current issue identified.;Long Term Goal: Stressors or current issues are controlled or eliminated.;Short Term goal: Identification and review with participant of any Quality of Life or Depression concerns found by scoring the questionnaire.;Long Term goal: The participant improves quality of Life and PHQ9 Scores as seen by post scores and/or verbalization of changes             Quality of Life Scores:  Scores of 19 and below usually indicate a poorer quality of life in these areas.  A difference of  2-3 points is a clinically meaningful difference.  A difference of 2-3 points in the total score of the Quality of Life Index has been associated with significant improvement in overall quality of life, self-image, physical symptoms, and general health in studies assessing change in quality of life.  PHQ-9: Review Flowsheet       03/02/2024  Depression screen PHQ 2/9  Decreased  Interest 0  Down, Depressed, Hopeless 0  PHQ - 2 Score 0  Altered sleeping 2  Tired, decreased energy 1  Change in appetite 1  Feeling bad or failure about yourself  0  Trouble concentrating 1  Moving slowly or fidgety/restless 0  Suicidal thoughts 0  PHQ-9 Score 5  Difficult doing work/chores Somewhat difficult   Interpretation of Total Score  Total Score Depression Severity:  1-4 = Minimal depression, 5-9 = Mild depression, 10-14 = Moderate depression, 15-19 = Moderately severe depression, 20-27 = Severe depression   Psychosocial Evaluation and Intervention:  Psychosocial Evaluation - 03/02/24 1039       Psychosocial Evaluation & Interventions   Interventions Encouraged to exercise with the program and follow  exercise prescription;Relaxation education    Comments Jeana is currently having issues with her sleep. She has trouble falling and staying asleep. Kennadi does have a history of depression, but states she feels it is controlled with meds at this time. She denied needing a referral to a therapist.  Staff will provide Odetta with relaxation techniques to help with her sleep.    Expected Outcomes For Shantai to participate in PR free from any psychosocial barriers or concerns    Continue Psychosocial Services  No Follow up required             Psychosocial Re-Evaluation:  Psychosocial Re-Evaluation     Row Name 03/02/24 1441             Psychosocial Re-Evaluation   Current issues with History of Depression;Current Psychotropic Meds;Current Sleep Concerns       Comments Arika is scheduled to start PR on 03/10/24. No new concerns since orientation on 03/02/24.       Expected Outcomes For pt to participate in PR free of any psychosocial barriers or concerns       Interventions Encouraged to attend Pulmonary Rehabilitation for the exercise       Continue Psychosocial Services  No Follow up required                Psychosocial Discharge (Final Psychosocial  Re-Evaluation):  Psychosocial Re-Evaluation - 03/02/24 1441       Psychosocial Re-Evaluation   Current issues with History of Depression;Current Psychotropic Meds;Current Sleep Concerns    Comments Irelynn is scheduled to start PR on 03/10/24. No new concerns since orientation on 03/02/24.    Expected Outcomes For pt to participate in PR free of any psychosocial barriers or concerns    Interventions Encouraged to attend Pulmonary Rehabilitation for the exercise    Continue Psychosocial Services  No Follow up required             Education: Education Goals: Education classes will be provided on a weekly basis, covering required topics. Participant will state understanding/return demonstration of topics presented.  Learning Barriers/Preferences:  Learning Barriers/Preferences - 03/02/24 0934       Learning Barriers/Preferences   Learning Barriers Exercise Concerns    Learning Preferences None             Education Topics: Know Your Numbers Group instruction that is supported by a PowerPoint presentation. Instructor discusses importance of knowing and understanding resting, exercise, and post-exercise oxygen saturation, heart rate, and blood pressure. Oxygen saturation, heart rate, blood pressure, rating of perceived exertion, and dyspnea are reviewed along with a normal range for these values.    Exercise for the Pulmonary Patient Group instruction that is supported by a PowerPoint presentation. Instructor discusses benefits of exercise, core components of exercise, frequency, duration, and intensity of an exercise routine, importance of utilizing pulse oximetry during exercise, safety while exercising, and options of places to exercise outside of rehab.    MET Level  Group instruction provided by PowerPoint, verbal discussion, and written material to support subject matter. Instructor reviews what METs are and how to increase METs.    Pulmonary Medications Verbally  interactive group education provided by instructor with focus on inhaled medications and proper administration.   Anatomy and Physiology of the Respiratory System Group instruction provided by PowerPoint, verbal discussion, and written material to support subject matter. Instructor reviews respiratory cycle and anatomical components of the respiratory system and their functions. Instructor also reviews differences in obstructive and restrictive  respiratory diseases with examples of each.    Oxygen Safety Group instruction provided by PowerPoint, verbal discussion, and written material to support subject matter. There is an overview of "What is Oxygen" and "Why do we need it".  Instructor also reviews how to create a safe environment for oxygen use, the importance of using oxygen as prescribed, and the risks of noncompliance. There is a brief discussion on traveling with oxygen and resources the patient may utilize.   Oxygen Use Group instruction provided by PowerPoint, verbal discussion, and written material to discuss how supplemental oxygen is prescribed and different types of oxygen supply systems. Resources for more information are provided.    Breathing Techniques Group instruction that is supported by demonstration and informational handouts. Instructor discusses the benefits of pursed lip and diaphragmatic breathing and detailed demonstration on how to perform both.     Risk Factor Reduction Group instruction that is supported by a PowerPoint presentation. Instructor discusses the definition of a risk factor, different risk factors for pulmonary disease, and how the heart and lungs work together.   Pulmonary Diseases Group instruction provided by PowerPoint, verbal discussion, and written material to support subject matter. Instructor gives an overview of the different type of pulmonary diseases. There is also a discussion on risk factors and symptoms as well as ways to manage the  diseases.   Stress and Energy Conservation Group instruction provided by PowerPoint, verbal discussion, and written material to support subject matter. Instructor gives an overview of stress and the impact it can have on the body. Instructor also reviews ways to reduce stress. There is also a discussion on energy conservation and ways to conserve energy throughout the day.   Warning Signs and Symptoms Group instruction provided by PowerPoint, verbal discussion, and written material to support subject matter. Instructor reviews warning signs and symptoms of stroke, heart attack, cold and flu. Instructor also reviews ways to prevent the spread of infection.   Other Education Group or individual verbal, written, or video instructions that support the educational goals of the pulmonary rehab program.    Knowledge Questionnaire Score:  Knowledge Questionnaire Score - 03/02/24 1014       Knowledge Questionnaire Score   Pre Score 17/18             Core Components/Risk Factors/Patient Goals at Admission:  Personal Goals and Risk Factors at Admission - 03/02/24 0935       Core Components/Risk Factors/Patient Goals on Admission    Weight Management Yes;Weight Gain    Intervention Weight Management: Develop a combined nutrition and exercise program designed to reach desired caloric intake, while maintaining appropriate intake of nutrient and fiber, sodium and fats, and appropriate energy expenditure required for the weight goal.;Weight Management: Provide education and appropriate resources to help participant work on and attain dietary goals.;Weight Management/Obesity: Establish reasonable short term and long term weight goals.;Obesity: Provide education and appropriate resources to help participant work on and attain dietary goals.    Expected Outcomes Short Term: Continue to assess and modify interventions until short term weight is achieved;Long Term: Adherence to nutrition and physical  activity/exercise program aimed toward attainment of established weight goal;Weight Gain: Understanding of general recommendations for a high calorie, high protein meal plan that promotes weight gain by distributing calorie intake throughout the day with the consumption for 4-5 meals, snacks, and/or supplements;Understanding recommendations for meals to include 15-35% energy as protein, 25-35% energy from fat, 35-60% energy from carbohydrates, less than 200mg  of dietary cholesterol, 20-35  gm of total fiber daily;Understanding of distribution of calorie intake throughout the day with the consumption of 4-5 meals/snacks    Improve shortness of breath with ADL's Yes    Intervention Provide education, individualized exercise plan and daily activity instruction to help decrease symptoms of SOB with activities of daily living.    Expected Outcomes Short Term: Improve cardiorespiratory fitness to achieve a reduction of symptoms when performing ADLs;Long Term: Be able to perform more ADLs without symptoms or delay the onset of symptoms             Core Components/Risk Factors/Patient Goals Review:   Goals and Risk Factor Review     Row Name 03/02/24 1443             Core Components/Risk Factors/Patient Goals Review   Personal Goals Review Weight Management/Obesity;Improve shortness of breath with ADL's;Develop more efficient breathing techniques such as purse lipped breathing and diaphragmatic breathing and practicing self-pacing with activity.       Review Destane is scheduled to start PR on 03/10/24. Unable to assess her goals at this time.       Expected Outcomes To improve shortness of breath with ADL's, develop more efficient breathing techniques such as purse lipped breathing and diaphragmatic breathing; and practicing self-pacing with activity and gain weight.                Core Components/Risk Factors/Patient Goals at Discharge (Final Review):   Goals and Risk Factor Review - 03/02/24  1443       Core Components/Risk Factors/Patient Goals Review   Personal Goals Review Weight Management/Obesity;Improve shortness of breath with ADL's;Develop more efficient breathing techniques such as purse lipped breathing and diaphragmatic breathing and practicing self-pacing with activity.    Review Carlynn is scheduled to start PR on 03/10/24. Unable to assess her goals at this time.    Expected Outcomes To improve shortness of breath with ADL's, develop more efficient breathing techniques such as purse lipped breathing and diaphragmatic breathing; and practicing self-pacing with activity and gain weight.             ITP Comments: Pt is making expected progress toward Pulmonary Rehab goals after completing 1 session(s). Recommend continued exercise, life style modification, education, and utilization of breathing techniques to increase stamina and strength, while also decreasing shortness of breath with exertion.  Dr. [name] is Medical Director for Pulmonary Rehab at [facility].

## 2024-03-12 ENCOUNTER — Telehealth (HOSPITAL_COMMUNITY): Payer: Self-pay

## 2024-03-12 ENCOUNTER — Encounter (HOSPITAL_COMMUNITY)
Admission: RE | Admit: 2024-03-12 | Discharge: 2024-03-12 | Disposition: A | Discharge status: Home / Self Care | Source: Ambulatory Visit | Attending: Thoracic Surgery | Admitting: Thoracic Surgery

## 2024-03-12 DIAGNOSIS — R0602 Shortness of breath: Secondary | ICD-10-CM

## 2024-03-12 NOTE — Progress Notes (Signed)
 Daily Session Note  Patient Details  Name: Aricka Goldberger MRN: [id number] Date of Birth: 1941/02/02 Referring Provider:   [name] Pulmonary Rehab Walk Test from 03/02/2024 in [facility], [facility]  Referring Provider [name]       Encounter Date: 03/12/2024  Check In:  Session Check In - 03/12/24 1021       Check-In   Supervising physician immediately available to respond to emergencies [facility] MD immediately available    Physician(s) Charles Connor, NP    Location [facility]    Staff Present Willard Harman, RN, Shasta Deist, MS, ACSM-CEP, Exercise Physiologist;[name], ACSM-CEP, Exercise Physiologist;Casey Burnetta Cart, MS, Exercise Physiologist;Johnny Alexia Angelucci, MS, Exercise Physiologist    Virtual Visit No    Medication changes reported     No    Fall or balance concerns reported    No    Tobacco Cessation No Change    Warm-up and Cool-down Performed as group-led instruction    Resistance Training Performed Yes    VAD Patient? No    PAD/SET Patient? No      Pain Assessment   Currently in Pain? No/denies    Multiple Pain Sites No             Capillary Blood Glucose: No results found for this or any previous visit (from the past 24 hours).    Social History   Tobacco Use  Smoking Status Never  Smokeless Tobacco Never    Goals Met:  Exercise tolerated well No report of concerns or symptoms today Strength training completed today  Goals Unmet:  Not Applicable  Comments: Service time is from 1007 to 1134    Dr. [name] is Medical Director for Pulmonary Rehab at [facility].

## 2024-03-12 NOTE — Telephone Encounter (Signed)
 Spoke with patient on phone yesterday regarding her insurance. She claims her BCBS plan is active and our systems must be wrong, and says she had a similar issue with a [facility] doctor before who mistakenly said her plan was inactive' but was 'able to make it go through'.  Called [employer] [place], confirmed her plan was active from 11/26/2022 - 08/26/2023. [employer] also confirmed the same details. Call made 03/12/24 @ 10:41am, ref #[id number].  Will have patient sign new insurance verification form when she comes in for class today.

## 2024-03-17 ENCOUNTER — Encounter (HOSPITAL_COMMUNITY)
Admission: RE | Admit: 2024-03-17 | Discharge: 2024-03-17 | Disposition: A | Discharge status: Home / Self Care | Source: Ambulatory Visit | Attending: Thoracic Surgery | Admitting: Thoracic Surgery

## 2024-03-17 ENCOUNTER — Telehealth (HOSPITAL_COMMUNITY): Payer: Self-pay

## 2024-03-17 DIAGNOSIS — R0602 Shortness of breath: Secondary | ICD-10-CM | POA: Diagnosis not present

## 2024-03-17 NOTE — Telephone Encounter (Signed)
 Patient stated they resolved their issues with their BC[employer] policy and it was active. Confirmed with [employer] that she has a current supplemental Medicare plan with them. Spoke with Josh @ 10:44am, ref# [id number].

## 2024-03-17 NOTE — Progress Notes (Signed)
 Daily Session Note  Patient Details  Name: Monica White MRN: [id number] Date of Birth: November 24, 1941 Referring Provider:   [name] Pulmonary Rehab Walk Test from 03/02/2024 in [facility], V[facility], & [facility]  Referring Provider [name]       Encounter Date: 03/17/2024  Check In:  Session Check In - 03/17/24 1020       Check-In   Supervising physician immediately available to respond to emergencies [facility] MD immediately available    Physician(s) Marlana Silvan, NP    [facility]    Staff Present [name], MS, ACSM-CEP, Exercise Physiologist;[name], ACSM-CEP, Exercise Physiologist;Bronislaus Verdell Felipe Horton, RT    Virtual Visit No    Medication changes reported     No    Fall or balance concerns reported    No    Tobacco Cessation No Change    Warm-up and Cool-down Performed as group-led instruction    Resistance Training Performed Yes    VAD Patient? No    PAD/SET Patient? No      Pain Assessment   Currently in Pain? No/denies    Multiple Pain Sites No             Capillary Blood Glucose: No results found for this or any previous visit (from the past 24 hours).    Social History   Tobacco Use  Smoking Status Never  Smokeless Tobacco Never    Goals Met:  Proper associated with RPD/PD & O2 Sat Independence with exercise equipment Exercise tolerated well No report of concerns or symptoms today Strength training completed today  Goals Unmet:  Not Applicable  Comments: Service time is from 1009 to 1131.    Dr. [name] is Medical Director for Pulmonary Rehab at [facility].

## 2024-03-19 ENCOUNTER — Encounter (HOSPITAL_COMMUNITY)
Admission: RE | Admit: 2024-03-19 | Discharge: 2024-03-19 | Discharge status: Home / Self Care | Source: Ambulatory Visit | Attending: Thoracic Surgery | Admitting: Thoracic Surgery

## 2024-03-19 DIAGNOSIS — R0602 Shortness of breath: Secondary | ICD-10-CM

## 2024-03-19 NOTE — Progress Notes (Signed)
 Daily Session Note  Patient Details  Name: Desteni Piscopo MRN: [id number] Date of Birth: 1941/06/17 Referring Provider:   [name] Pulmonary Rehab Walk Test from 03/02/2024 in [facility] for Heart, Vascular, & Lung Health  Referring Provider [name]       Encounter Date: 03/19/2024  Check In:  Session Check In - 03/19/24 1022       Check-In   Supervising physician immediately available to respond to emergencies [facility] MD immediately available    Physician(s) Lawana Pray, NP    [facility]    Staff Present [name], MS, ACSM-CEP, Exercise Physiologist;[name], ACSM-CEP, Exercise Physiologist;Casey Carmen Chol, RN, BSN    Virtual Visit No    Medication changes reported     No    Fall or balance concerns reported    No    Tobacco Cessation No Change    Warm-up and Cool-down Performed as group-led instruction    Resistance Training Performed Yes    VAD Patient? No    PAD/SET Patient? No      Pain Assessment   Currently in Pain? No/denies    Multiple Pain Sites No             Capillary Blood Glucose: No results found for this or any previous visit (from the past 24 hours).    Social History   Tobacco Use  Smoking Status Never  Smokeless Tobacco Never    Goals Met:  Exercise tolerated well No report of concerns or symptoms today Strength training completed today  Goals Unmet:  Not Applicable  Comments: Service time is from 1006 to 1139    Dr. [name] is Medical Director for Pulmonary Rehab at [facility].

## 2024-03-24 ENCOUNTER — Encounter (HOSPITAL_COMMUNITY)
Admission: RE | Admit: 2024-03-24 | Discharge: 2024-03-24 | Disposition: A | Discharge status: Home / Self Care | Source: Ambulatory Visit | Attending: Thoracic Surgery | Admitting: Thoracic Surgery

## 2024-03-24 VITALS — Wt 128.7 lb

## 2024-03-24 DIAGNOSIS — R0602 Shortness of breath: Secondary | ICD-10-CM

## 2024-03-25 ENCOUNTER — Telehealth (HOSPITAL_COMMUNITY): Payer: Self-pay

## 2024-03-25 NOTE — Telephone Encounter (Signed)
 Pt absent from Pulmonary Rehab due to appointment.

## 2024-03-26 ENCOUNTER — Encounter (HOSPITAL_COMMUNITY)
Admission: RE | Admit: 2024-03-26 | Discharge: 2024-03-26 | Disposition: A | Discharge status: Home / Self Care | Source: Ambulatory Visit | Attending: Internal Medicine | Admitting: Internal Medicine

## 2024-03-26 DIAGNOSIS — D649 Anemia, unspecified: Secondary | ICD-10-CM | POA: Diagnosis not present

## 2024-03-26 DIAGNOSIS — R0602 Shortness of breath: Secondary | ICD-10-CM | POA: Insufficient documentation

## 2024-03-26 DIAGNOSIS — E039 Hypothyroidism, unspecified: Secondary | ICD-10-CM | POA: Diagnosis not present

## 2024-03-26 DIAGNOSIS — Z48815 Encounter for surgical aftercare following surgery on the digestive system: Secondary | ICD-10-CM | POA: Diagnosis not present

## 2024-03-26 DIAGNOSIS — F339 Major depressive disorder, recurrent, unspecified: Secondary | ICD-10-CM | POA: Diagnosis not present

## 2024-03-26 DIAGNOSIS — M1990 Unspecified osteoarthritis, unspecified site: Secondary | ICD-10-CM | POA: Diagnosis not present

## 2024-03-26 DIAGNOSIS — E440 Moderate protein-calorie malnutrition: Secondary | ICD-10-CM | POA: Diagnosis not present

## 2024-03-26 DIAGNOSIS — Z556 Problems related to health literacy: Secondary | ICD-10-CM | POA: Diagnosis not present

## 2024-03-26 DIAGNOSIS — K219 Gastro-esophageal reflux disease without esophagitis: Secondary | ICD-10-CM | POA: Diagnosis not present

## 2024-03-26 NOTE — Progress Notes (Signed)
 Daily Session Note  Patient Details  Name: Monica White MRN: [id number] Date of Birth: [birth year] Referring Provider:   [name] Pulmonary Rehab Walk Test from 03/02/2024 in [facility] for Heart, Vascular, & Lung Health  Referring Provider [name]       Encounter Date: 03/26/2024  Check In:  Session Check In - 03/26/24 1035       Check-In   Supervising physician immediately available to respond to emergencies [facility] MD immediately available    Physician(s) Koren Persons, NP    [facility]    Staff Present [name], MS, ACSM-CEP, Exercise Physiologist;[name], ACSM-CEP, Exercise Physiologist;Casey Felipe Horton, Graig Lawyer, RN, Mercedes Stake, RN, BSN    Virtual Visit No    Medication changes reported     No    Fall or balance concerns reported    No    Tobacco Cessation No Change    Warm-up and Cool-down Performed as group-led Writer Performed Yes    VAD Patient? No    PAD/SET Patient? No      Pain Assessment   Currently in Pain? No/denies    Multiple Pain Sites No             Capillary Blood Glucose: No results found for this or any previous visit (from the past 24 hours).    Social History   Tobacco Use  Smoking Status Never  Smokeless Tobacco Never    Goals Met:  Exercise tolerated well No report of concerns or symptoms today Strength training completed today  Goals Unmet:  Not Applicable  Comments: Service time is from 1013 to 1145    Dr. [name] is Medical Director for Pulmonary Rehab at [facility].

## 2024-03-27 DIAGNOSIS — E785 Hyperlipidemia, unspecified: Secondary | ICD-10-CM | POA: Diagnosis not present

## 2024-03-27 DIAGNOSIS — F339 Major depressive disorder, recurrent, unspecified: Secondary | ICD-10-CM | POA: Diagnosis not present

## 2024-03-27 DIAGNOSIS — R0600 Dyspnea, unspecified: Secondary | ICD-10-CM | POA: Diagnosis not present

## 2024-03-27 DIAGNOSIS — I700 Atherosclerosis of aorta: Secondary | ICD-10-CM | POA: Diagnosis not present

## 2024-03-27 DIAGNOSIS — R0981 Nasal congestion: Secondary | ICD-10-CM | POA: Diagnosis not present

## 2024-03-27 DIAGNOSIS — N2889 Other specified disorders of kidney and ureter: Secondary | ICD-10-CM | POA: Diagnosis not present

## 2024-03-27 DIAGNOSIS — E039 Hypothyroidism, unspecified: Secondary | ICD-10-CM | POA: Diagnosis not present

## 2024-03-27 DIAGNOSIS — H34232 Retinal artery branch occlusion, left eye: Secondary | ICD-10-CM | POA: Diagnosis not present

## 2024-03-27 DIAGNOSIS — I509 Heart failure, unspecified: Secondary | ICD-10-CM | POA: Diagnosis not present

## 2024-03-27 DIAGNOSIS — Z8719 Personal history of other diseases of the digestive system: Secondary | ICD-10-CM | POA: Diagnosis not present

## 2024-03-27 DIAGNOSIS — R519 Headache, unspecified: Secondary | ICD-10-CM | POA: Diagnosis not present

## 2024-03-27 DIAGNOSIS — K3189 Other diseases of stomach and duodenum: Secondary | ICD-10-CM | POA: Diagnosis not present

## 2024-03-31 ENCOUNTER — Encounter (HOSPITAL_COMMUNITY)
Admission: RE | Admit: 2024-03-31 | Discharge: 2024-03-31 | Disposition: A | Discharge status: Home / Self Care | Source: Ambulatory Visit | Attending: Thoracic Surgery | Admitting: Thoracic Surgery

## 2024-03-31 DIAGNOSIS — R0602 Shortness of breath: Secondary | ICD-10-CM

## 2024-03-31 NOTE — Progress Notes (Signed)
 Daily Session Note  Patient Details  Name: Monica White MRN: [id number] Date of Birth: [birth year] Referring Provider:   [name] Pulmonary Rehab Walk Test from 03/02/2024 in [facility] for Heart, Vascular, & Lung Health  Referring Provider [name]       Encounter Date: 03/31/2024  Check In:  Session Check In - 03/31/24 1023       Check-In   Supervising physician immediately available to respond to emergencies [facility] MD immediately available    Physician(s) Slater Duncan, NP    [facility]    Staff Present [name], MS, ACSM-CEP, Exercise Physiologist;[name], ACSM-CEP, Exercise Physiologist;Casey Carmen Chol, RN, BSN;Johnny Porter, MS, Exercise Physiologist    Virtual Visit No    Medication changes reported     No    Fall or balance concerns reported    No    Tobacco Cessation No Change    Warm-up and Cool-down Performed as group-led instruction    Resistance Training Performed Yes    VAD Patient? No    PAD/SET Patient? No      Pain Assessment   Currently in Pain? No/denies    Multiple Pain Sites No             Capillary Blood Glucose: No results found for this or any previous visit (from the past 24 hours).    Social History   Tobacco Use  Smoking Status Never  Smokeless Tobacco Never    Goals Met:  Exercise tolerated well No report of concerns or symptoms today Strength training completed today  Goals Unmet:  Not Applicable  Comments: Service time is from 1008 to 1135    Dr. [name] is Medical Director for Pulmonary Rehab at [facility].

## 2024-04-02 ENCOUNTER — Encounter (HOSPITAL_COMMUNITY)
Admission: RE | Admit: 2024-04-02 | Discharge: 2024-04-02 | Disposition: A | Discharge status: Home / Self Care | Source: Ambulatory Visit | Attending: Thoracic Surgery | Admitting: Thoracic Surgery

## 2024-04-02 DIAGNOSIS — R0602 Shortness of breath: Secondary | ICD-10-CM

## 2024-04-02 NOTE — Progress Notes (Signed)
 Daily Session Note  Patient Details  Name: Monica White MRN: [id number] Date of Birth: Feb 05, 1941 Referring Provider:   [name] Pulmonary Rehab Walk Test from 03/02/2024 in [facility] for Heart, Vascular, & Lung [facility]  Referring Provider [name]       Encounter Date: 04/02/2024  Check In:  Session Check In - 04/02/24 1035       Check-In   Supervising physician immediately available to respond to emergencies [facility] MD immediately available    Physician(s) Levin Reamer, NP    [facility]    Staff Present [name], MS, ACSM-CEP, Exercise Physiologist;[name], ACSM-CEP, Exercise Physiologist;Rosary Filosa Felipe Horton, Graig Lawyer, RN, Malvin Searing, MS, ACSM-CEP, CCRP, Exercise Physiologist    Virtual Visit No    Medication changes reported     No    Fall or balance concerns reported    No    Tobacco Cessation No Change    Warm-up and Cool-down Performed as group-led instruction    Resistance Training Performed Yes    VAD Patient? No    PAD/SET Patient? No      Pain Assessment   Currently in Pain? No/denies             Capillary Blood Glucose: No results found for this or any previous visit (from the past 24 hours).    Social History   Tobacco Use  Smoking Status Never  Smokeless Tobacco Never    Goals Met:  Proper associated with RPD/PD & O2 Sat Independence with exercise equipment Exercise tolerated well No report of concerns or symptoms today Strength training completed today  Goals Unmet:  Not Applicable  Comments: Service time is from 1013 to 1139.    Dr. [name] is Medical Director for Pulmonary Rehab at [facility].

## 2024-04-03 DIAGNOSIS — M1712 Unilateral primary osteoarthritis, left knee: Secondary | ICD-10-CM | POA: Diagnosis not present

## 2024-04-07 ENCOUNTER — Encounter (HOSPITAL_COMMUNITY)
Admission: RE | Admit: 2024-04-07 | Discharge: 2024-04-07 | Disposition: A | Discharge status: Home / Self Care | Source: Ambulatory Visit | Attending: Thoracic Surgery | Admitting: Thoracic Surgery

## 2024-04-07 VITALS — Wt 131.4 lb

## 2024-04-07 DIAGNOSIS — R0602 Shortness of breath: Secondary | ICD-10-CM

## 2024-04-07 NOTE — Progress Notes (Signed)
 Daily Session Note  Patient Details  Name: Monica White MRN: [id number] Date of Birth: August 08, 1941 Referring Provider:   [name] Pulmonary Rehab Walk Test from 03/02/2024 in [facility] for Heart, Vascular, & Lung Health  Referring Provider [name]       Encounter Date: 04/07/2024  Check In:  Session Check In - 04/07/24 1115       Check-In   Supervising physician immediately available to respond to emergencies [facility] MD immediately available    Physician(s) Levin Reamer, NP    Location [facility]    Staff Present [name], MS, ACSM-CEP, Exercise Physiologist;[name], ACSM-CEP, Exercise Physiologist;Fatina Sprankle Carmen Chol, RN, BSN;Samantha Belarus, RD, Evalyn Hillier, RN, BSN    Virtual Visit No    Medication changes reported     No    Fall or balance concerns reported    No    Tobacco Cessation No Change    Warm-up and Cool-down Performed as group-led Writer Performed Yes    VAD Patient? No    PAD/SET Patient? No      Pain Assessment   Currently in Pain? No/denies             Capillary Blood Glucose: No results found for this or any previous visit (from the past 24 hours).   Exercise Prescription Changes - 04/07/24 1100       Response to Exercise   Blood Pressure (Admit) 90/58    Blood Pressure (Exercise) 124/70    Blood Pressure (Exit) 96/62    Heart Rate (Admit) 75 bpm    Heart Rate (Exercise) 92 bpm    Heart Rate (Exit) 81 bpm    Oxygen Saturation (Admit) 98 %    Oxygen Saturation (Exercise) 97 %    Oxygen Saturation (Exit) 94 %    Rating of Perceived Exertion (Exercise) 9    Perceived Dyspnea (Exercise) 1    Duration Continue with 30 min of aerobic exercise without signs/symptoms of physical distress.    Intensity THRR unchanged      Progression   Progression Continue to progress workloads to maintain intensity without signs/symptoms of physical  distress.      Resistance Training   Training Prescription Yes    Weight red bands    Reps 10-15    Time 10 Minutes      Recumbant Bike   Level 3    Minutes 15    METs 3      NuStep   Level 3    Minutes 15    METs 2.3             Social History   Tobacco Use  Smoking Status Never  Smokeless Tobacco Never    Goals Met:  Proper associated with RPD/PD & O2 Sat Independence with exercise equipment Exercise tolerated well No report of concerns or symptoms today Strength training completed today  Goals Unmet:  Not Applicable  Comments: Service time is from 1015 to 1132.    Dr. [name] is Medical Director for Pulmonary Rehab at [facility].

## 2024-04-08 DIAGNOSIS — M1711 Unilateral primary osteoarthritis, right knee: Secondary | ICD-10-CM | POA: Diagnosis not present

## 2024-04-08 NOTE — Progress Notes (Signed)
 Pulmonary Individual Treatment Plan  Patient Details  Name: Monica White MRN: [id number] Date of Birth: [birth year] Referring Provider:   [name] Pulmonary Rehab Walk Test from 03/02/2024 in [facility]  Referring Provider Klapper  [Ellison]       Initial Encounter Date:  Flowsheet Row Pulmonary Rehab Walk Test from 03/02/2024 in [facility]  Date 03/02/24       Visit Diagnosis: Shortness of breath  Patient's Home Medications on Admission:   Current Outpatient Medications:    acetaminophen  (TYLENOL ) 325 MG tablet, Take 2 tablets (650 mg total) by mouth every 6 (six) hours as needed. (Patient taking differently: Take 650 mg by mouth every 6 (six) hours as needed for mild pain (pain score 1-3) or headache.), Disp: , Rfl:    Ascorbic Acid (VITAMIN C PO), Take 1 tablet by mouth daily., Disp: , Rfl:    cholecalciferol (VITAMIN D3) 25 MCG (1000 UNIT) tablet, Take 1,000 Units by mouth daily., Disp: , Rfl:    Cyanocobalamin  (B-12) 1000 MCG CAPS, Take 1,000 mcg by mouth daily., Disp: , Rfl:    desvenlafaxine (PRISTIQ) 25 MG 24 hr tablet, Take 25 mg by mouth in the morning., Disp: , Rfl:    estradiol  (ESTRACE ) 0.5 MG tablet, Take 0.5 mg by mouth daily., Disp: , Rfl:    levothyroxine  (SYNTHROID ) 75 MCG tablet, Take 75 mcg by mouth daily before breakfast., Disp: , Rfl:    medroxyPROGESTERone  (PROVERA ) 2.5 MG tablet, Take 1 tablet (2.5 mg total) by mouth daily., Disp: 90 tablet, Rfl: 3   Multiple Vitamins-Minerals (MULTIVITAMIN PO), Take 1 tablet by mouth daily with breakfast., Disp: , Rfl:    ondansetron  (ZOFRAN -ODT) 4 MG disintegrating tablet, Take 1 tablet (4 mg total) by mouth every 6 (six) hours as needed for nausea. (Patient not taking: Reported on 08/28/2023), Disp: 30 tablet, Rfl: 3   oxyCODONE  (OXY IR/ROXICODONE ) 5 MG immediate release tablet, Take 1 tablet (5 mg total) by  mouth every 6 (six) hours as needed for breakthrough pain. (Patient not taking: Reported on 08/28/2023), Disp: 15 tablet, Rfl: 0   pantoprazole  (PROTONIX ) 40 MG tablet, Take 1 tablet (40 mg total) by mouth 2 (two) times daily. (Patient not taking: Reported on 08/28/2023), Disp: 60 tablet, Rfl: 0   Probiotic Product (PROBIOTIC PO), Take 1 capsule by mouth in the morning., Disp: , Rfl:   Past Medical History: Past Medical History:  Diagnosis Date   Anemia    Arthritis    Dyspnea    GERD (gastroesophageal reflux disease)    History of hiatal hernia    Hypercholesteremia    Hypothyroid 2015   PONV (postoperative nausea and vomiting)     Tobacco Use: Social History   Tobacco Use  Smoking Status Never  Smokeless Tobacco Never    Labs: Review Flowsheet  More data exists      Latest Ref Rng & Units 03/26/2018 01/06/2021 12/01/2021 02/14/2023 02/18/2023  Labs for ITP Cardiac and Pulmonary Rehab  Cholestrol 100 - 199 mg/dL 409  811  - - -  LDL (calc) 0 - 99 mg/dL 90  914  - - -  HDL-C >78 mg/dL 64  57  - - -  Trlycerides <150 mg/dL 73  295  - 621  308   PH, Arterial 7.350 - 7.450 - - 7.376  - -  PCO2 arterial 32.0 - 48.0 mmHg - - 35.7  - -  Bicarbonate 20.0 - 28.0 mmol/L - - 20.9  21.6  - -  TCO2 22 - 32 mmol/L - - 22  23  - -  Acid-base deficit 0.0 - 2.0 mmol/L - - 4.0  4.0  - -  O2 Saturation % - - 97.0  70.0  - -    Details       Multiple values from one day are sorted in reverse-chronological order         Capillary Blood Glucose: Lab Results  Component Value Date   GLUCAP 117 (H) 02/16/2023   GLUCAP 166 (H) 02/16/2023   GLUCAP 123 (H) 02/16/2023   GLUCAP 138 (H) 02/15/2023   GLUCAP 146 (H) 02/14/2023     Pulmonary Assessment Scores:  Pulmonary Assessment Scores     Row Name 03/02/24 1013         ADL UCSD   ADL Phase Entry     SOB Score total 63       CAT Score   CAT Score 13       mMRC Score   mMRC Score 4             UCSD: Self-administered  rating of dyspnea associated with activities of daily living (ADLs) 6-point scale (0 = "not at all" to 5 = "maximal or unable to do because of breathlessness")  Scoring Scores range from 0 to 120.  Minimally important difference is 5 units  CAT: CAT can identify the health impairment of COPD patients and is better correlated with disease progression.  CAT has a scoring range of zero to 40. The CAT score is classified into four groups of low (less than 10), medium (10 - 20), high (21-30) and very high (31-40) based on the impact level of disease on health status. A CAT score over 10 suggests significant symptoms.  A worsening CAT score could be explained by an exacerbation, poor medication adherence, poor inhaler technique, or progression of COPD or comorbid conditions.  CAT MCID is 2 points  mMRC: mMRC (Modified Medical Research Council) Dyspnea Scale is used to assess the degree of baseline functional disability in patients of respiratory disease due to dyspnea. No minimal important difference is established. A decrease in score of 1 point or greater is considered a positive change.   Pulmonary Function Assessment:  Pulmonary Function Assessment - 03/02/24 1043       Breath   Bilateral Breath Sounds Clear             Exercise Target Goals: Exercise Program Goal: Individual exercise prescription set using results from initial 6 min walk test and THRR while considering  patient's activity barriers and safety.   Exercise Prescription Goal: Initial exercise prescription builds to 30-45 minutes a day of aerobic activity, 2-3 days per week.  Home exercise guidelines will be given to patient during program as part of exercise prescription that the participant will acknowledge.  Activity Barriers & Risk Stratification:  Activity Barriers & Cardiac Risk Stratification - 03/02/24 0926       Activity Barriers & Cardiac Risk Stratification   Activity Barriers Deconditioning;Muscular  Weakness;Shortness of Breath   No bending, twisting, or lifting   Cardiac Risk Stratification Moderate             6 Minute Walk:  6 Minute Walk     Row Name 03/02/24 1038         6 Minute Walk   Phase Initial     Distance 680 feet  Walk Time 6 minutes     # of Rest Breaks 1  2:15-3:30     MPH 1.29     METS 1.4     RPE 13     Perceived Dyspnea  1     VO2 Peak 4.88     Symptoms No     Resting HR 82 bpm     Resting BP 106/70     Resting Oxygen Saturation  98 %     Exercise Oxygen Saturation  during 6 min walk 93 %     Max Ex. HR 125 bpm     Max Ex. BP 104/70     2 Minute Post BP 108/70       Interval HR   1 Minute HR 108     2 Minute HR 109     3 Minute HR 110     4 Minute HR 114     5 Minute HR 123     6 Minute HR 125     2 Minute Post HR 103     Interval Heart Rate? Yes       Interval Oxygen   Interval Oxygen? Yes     Baseline Oxygen Saturation % 98 %     1 Minute Oxygen Saturation % 97 %     1 Minute Liters of Oxygen 0 L     2 Minute Oxygen Saturation % 93 %     2 Minute Liters of Oxygen 0 L     3 Minute Oxygen Saturation % 98 %     3 Minute Liters of Oxygen 0 L     4 Minute Oxygen Saturation % 99 %     4 Minute Liters of Oxygen 0 L     5 Minute Oxygen Saturation % 98 %     5 Minute Liters of Oxygen 0 L     6 Minute Oxygen Saturation % 96 %     6 Minute Liters of Oxygen 0 L     2 Minute Post Oxygen Saturation % 96 %     2 Minute Post Liters of Oxygen 0 L              Oxygen Initial Assessment:  Oxygen Initial Assessment - 03/02/24 0928       Home Oxygen   Home Oxygen Device None    Sleep Oxygen Prescription None    Home Exercise Oxygen Prescription None    Home Resting Oxygen Prescription None      Initial 6 min Walk   Oxygen Used None      Program Oxygen Prescription   Program Oxygen Prescription None      Intervention   Short Term Goals To learn and understand importance of maintaining oxygen saturations>88%;To learn and  understand importance of monitoring SPO2 with pulse oximeter and demonstrate accurate use of the pulse oximeter.;To learn and demonstrate proper pursed lip breathing techniques or other breathing techniques.     Long  Term Goals Exhibits proper breathing techniques, such as pursed lip breathing or other method taught during program session;Verbalizes importance of monitoring SPO2 with pulse oximeter and return demonstration;Maintenance of O2 saturations>88%             Oxygen Re-Evaluation:  Oxygen Re-Evaluation     Row Name 03/04/24 1110             Program Oxygen Prescription   Program Oxygen Prescription None         Home  Oxygen   Home Oxygen Device None       Sleep Oxygen Prescription None       Home Exercise Oxygen Prescription None       Home Resting Oxygen Prescription None         Goals/Expected Outcomes   Short Term Goals To learn and understand importance of maintaining oxygen saturations>88%;To learn and understand importance of monitoring SPO2 with pulse oximeter and demonstrate accurate use of the pulse oximeter.;To learn and demonstrate proper pursed lip breathing techniques or other breathing techniques.        Long  Term Goals Exhibits proper breathing techniques, such as pursed lip breathing or other method taught during program session;Verbalizes importance of monitoring SPO2 with pulse oximeter and return demonstration;Maintenance of O2 saturations>88%       Goals/Expected Outcomes Compliance and understanding of oxygen saturation and breathing techniques to decrease shortness of breath.                Oxygen Discharge (Final Oxygen Re-Evaluation):  Oxygen Re-Evaluation - 03/04/24 1110       Program Oxygen Prescription   Program Oxygen Prescription None      Home Oxygen   Home Oxygen Device None    Sleep Oxygen Prescription None    Home Exercise Oxygen Prescription None    Home Resting Oxygen Prescription None      Goals/Expected Outcomes   Short  Term Goals To learn and understand importance of maintaining oxygen saturations>88%;To learn and understand importance of monitoring SPO2 with pulse oximeter and demonstrate accurate use of the pulse oximeter.;To learn and demonstrate proper pursed lip breathing techniques or other breathing techniques.     Long  Term Goals Exhibits proper breathing techniques, such as pursed lip breathing or other method taught during program session;Verbalizes importance of monitoring SPO2 with pulse oximeter and return demonstration;Maintenance of O2 saturations>88%    Goals/Expected Outcomes Compliance and understanding of oxygen saturation and breathing techniques to decrease shortness of breath.             Initial Exercise Prescription:  Initial Exercise Prescription - 03/02/24 1000       Date of Initial Exercise RX and Referring Provider   Date 03/02/24    Referring Provider [name]   Expected Discharge Date 05/28/24      Recumbant Bike   Level 1    RPM 20    Watts 15    Minutes 15    METs 1.9      NuStep   Level 1    SPM 72    Minutes 15    METs 1.5      Prescription Details   Frequency (times per week) 2    Duration Progress to 30 minutes of continuous aerobic without signs/symptoms of physical distress      Intensity   THRR 40-80% of Max Heartrate 55-110    Ratings of Perceived Exertion 11-13    Perceived Dyspnea 0-4      Progression   Progression Continue progressive overload as per policy without signs/symptoms or physical distress.      Resistance Training   Training Prescription Yes    Weight red bands    Reps 10-15             Perform Capillary Blood Glucose checks as needed.  Exercise Prescription Changes:   Exercise Prescription Changes     Row Name 03/10/24 1100 03/19/24 0942 04/07/24 1100  Response to Exercise   Blood Pressure (Admit) 123/83 121/75 90/58     Blood Pressure (Exercise) 130/80 -- 124/70     Blood Pressure (Exit)  108/70 110/68 96/62     Heart Rate (Admit) 88 bpm 86 bpm 75 bpm     Heart Rate (Exercise) 87 bpm 107 bpm 92 bpm     Heart Rate (Exit) 88 bpm 84 bpm 81 bpm     Oxygen Saturation (Admit) 98 % 98 % 98 %     Oxygen Saturation (Exercise) 96 % 93 % 97 %     Oxygen Saturation (Exit) 98 % 98 % 94 %     Rating of Perceived Exertion (Exercise) 9 11 9      Perceived Dyspnea (Exercise) 0 1 1     Duration Progress to 30 minutes of  aerobic without signs/symptoms of physical distress Continue with 30 min of aerobic exercise without signs/symptoms of physical distress. Continue with 30 min of aerobic exercise without signs/symptoms of physical distress.     Intensity THRR unchanged THRR unchanged THRR unchanged       Progression   Progression Continue to progress workloads to maintain intensity without signs/symptoms of physical distress. Continue to progress workloads to maintain intensity without signs/symptoms of physical distress. Continue to progress workloads to maintain intensity without signs/symptoms of physical distress.       Resistance Training   Training Prescription Yes Yes Yes     Weight red bands red bands red bands     Reps 10-15 10-15 10-15     Time 10 Minutes 10 Minutes 10 Minutes       Recumbant Bike   Level 1 3 3      RPM -- 54 --     Minutes 15 15 15      METs 1.9 3.1 3       NuStep   Level 1 2 3      SPM 73 75 --     Minutes 15 15 15      METs 2.1 2.3 2.3              Exercise Comments:   Exercise Comments     Row Name 03/10/24 1156           Exercise Comments Pt completed first day of exercise. She exercised for 15 min on the recumbent bike and Nustep. Monica White averaged 1.9 METs at level 1 on the recumbent bike and 2.1 METs at level 1 on the Nustep. She performed the warmup and coodown standing without limitations. Discussed METs.                Exercise Goals and Review:   Exercise Goals     Row Name 03/02/24 0927             Exercise Goals    Increase Physical Activity Yes       Intervention Develop an individualized exercise prescription for aerobic and resistive training based on initial evaluation findings, risk stratification, comorbidities and participant's personal goals.;Provide advice, education, support and counseling about physical activity/exercise needs.       Expected Outcomes Short Term: Attend rehab on a regular basis to increase amount of physical activity.;Long Term: Exercising regularly at least 3-5 days a week.;Long Term: Add in home exercise to make exercise part of routine and to increase amount of physical activity.       Increase Strength and Stamina Yes       Intervention Provide advice, education, support and counseling  about physical activity/exercise needs.;Develop an individualized exercise prescription for aerobic and resistive training based on initial evaluation findings, risk stratification, comorbidities and participant's personal goals.       Expected Outcomes Short Term: Increase workloads from initial exercise prescription for resistance, speed, and METs.;Short Term: Perform resistance training exercises routinely during rehab and add in resistance training at home;Long Term: Improve cardiorespiratory fitness, muscular endurance and strength as measured by increased METs and functional capacity ( )       Able to understand and use rate of perceived exertion (RPE) scale Yes       Intervention Provide education and explanation on how to use RPE scale       Expected Outcomes Short Term: Able to use RPE daily in rehab to express subjective intensity level;Long Term:  Able to use RPE to guide intensity level when exercising independently       Able to understand and use Dyspnea scale Yes       Intervention Provide education and explanation on how to use Dyspnea scale       Expected Outcomes Short Term: Able to use Dyspnea scale daily in rehab to express subjective sense of shortness of breath during  exertion;Long Term: Able to use Dyspnea scale to guide intensity level when exercising independently       Knowledge and understanding of Target Heart Rate Range (THRR) Yes       Intervention Provide education and explanation of THRR including how the numbers were predicted and where they are located for reference       Expected Outcomes Short Term: Able to state/look up THRR;Short Term: Able to use daily as guideline for intensity in rehab;Long Term: Able to use THRR to govern intensity when exercising independently       Understanding of Exercise Prescription Yes       Intervention Provide education, explanation, and written materials on patient's individual exercise prescription       Expected Outcomes Short Term: Able to explain program exercise prescription;Long Term: Able to explain home exercise prescription to exercise independently                Exercise Goals Re-Evaluation :  Exercise Goals Re-Evaluation     Row Name 03/04/24 1108 04/03/24 0910           Exercise Goal Re-Evaluation   Exercise Goals Review Increase Physical Activity;Able to understand and use Dyspnea scale;Understanding of Exercise Prescription;Increase Strength and Stamina;Knowledge and understanding of Target Heart Rate Range (THRR);Able to understand and use rate of perceived exertion (RPE) scale Increase Physical Activity;Able to understand and use Dyspnea scale;Understanding of Exercise Prescription;Increase Strength and Stamina;Knowledge and understanding of Target Heart Rate Range (THRR);Able to understand and use rate of perceived exertion (RPE) scale      Comments Monica White is scheduled to begin exercise on 4/15. Will continue to monitor and progress as able. Monica White has completed 7 exercise sessions. She exercises for 15 min on the recumbent bike and Nustep. She averages 3.4 METs at level 3 on the recumbent elliptical and 2.2 METs at level 3 on the Nustep. Monica White performs the warmup and cooldown standing  without limitations. Monica White has increased her level for both exercise modes several times. She tolerates progressions well. Will continue to monitor and progress as able.      Expected Outcomes Through exercise at rehab and home, the patient will decrease shortness of breath and feel confident in carrying out an exercise regimen at home. Through exercise at rehab and  home, the patient will decrease shortness of breath and feel confident in carrying out an exercise regimen at home.               Discharge Exercise Prescription (Final Exercise Prescription Changes):  Exercise Prescription Changes - 04/07/24 1100       Response to Exercise   Blood Pressure (Admit) 90/58    Blood Pressure (Exercise) 124/70    Blood Pressure (Exit) 96/62    Heart Rate (Admit) 75 bpm    Heart Rate (Exercise) 92 bpm    Heart Rate (Exit) 81 bpm    Oxygen Saturation (Admit) 98 %    Oxygen Saturation (Exercise) 97 %    Oxygen Saturation (Exit) 94 %    Rating of Perceived Exertion (Exercise) 9    Perceived Dyspnea (Exercise) 1    Duration Continue with 30 min of aerobic exercise without signs/symptoms of physical distress.    Intensity THRR unchanged      Progression   Progression Continue to progress workloads to maintain intensity without signs/symptoms of physical distress.      Resistance Training   Training Prescription Yes    Weight red bands    Reps 10-15    Time 10 Minutes      Recumbant Bike   Level 3    Minutes 15    METs 3      NuStep   Level 3    Minutes 15    METs 2.3             Nutrition:  Target Goals: Understanding of nutrition guidelines, daily intake of sodium 1500mg , cholesterol 200mg , calories 30% from fat and 7% or less from saturated fats, daily to have 5 or more servings of fruits and vegetables.  Biometrics:  Pre Biometrics - 03/02/24 0916       Pre Biometrics   Grip Strength 15 kg              Nutrition Therapy Plan and Nutrition Goals:   Nutrition Therapy & Goals - 03/10/24 1201       Nutrition Therapy   Diet General Healthy Diet      Personal Nutrition Goals   Nutrition Goal Patient to improve diet quality by using the plate method as a guide for meal planning to include lean protein/plant protein, fruits, vegetables, whole grains, nonfat dairy as part of a well-balanced diet.    Personal Goal #2 Patient to identify strategies for weight gain of 0.5-2.0# per week.    Comments Monica White has medical history of shortness of breath, mulitple hiatal hernia repair, gtube removal 01/2023. She did recently have another hernia surgery in 12/2023 and was discharged to SNF on a full liquid diet. At this time, she has advanced to a regular diet. Will continue to montior weight. She is motivated to gain weight; will continue to discuss strategies for weight gain. Patient will continue to benefit from participation in pulmonary rehab for nutrition, exercise, and lifestyle modification support.      Intervention Plan   Intervention Prescribe, educate and counsel regarding individualized specific dietary modifications aiming towards targeted core components such as weight, hypertension, lipid management, diabetes, heart failure and other comorbidities.;Nutrition handout(s) given to patient.    Expected Outcomes Short Term Goal: Understand basic principles of dietary content, such as calories, fat, sodium, cholesterol and nutrients.;Long Term Goal: Adherence to prescribed nutrition plan.             Nutrition Assessments:  MEDIFICTS Score Key: >=70  Need to make dietary changes  40-70 Heart Healthy Diet <= 40 Therapeutic Level Cholesterol Diet   Picture Your Plate Scores: <16 Unhealthy dietary pattern with much room for improvement. 41-50 Dietary pattern unlikely to meet recommendations for good health and room for improvement. 51-60 More healthful dietary pattern, with some room for improvement.  >60 Healthy dietary pattern, although  there may be some specific behaviors that could be improved.    Nutrition Goals Re-Evaluation:  Nutrition Goals Re-Evaluation     Row Name 03/10/24 1201             Goals   Current Weight 127 lb 13.9 oz (58 kg)       Expected Outcome Monica White has medical history of shortness of breath, mulitple hiatal hernia repair, gtube removal 01/2023. She did recently have another hernia surgery in 12/2023 and was discharged to SNF on a full liquid diet. At this time, she has advanced to a regular diet. Will continue to montior weight. She is motivated to gain weight; will continue to discuss strategies for weight gain. Patient will continue to benefit from participation in pulmonary rehab for nutrition, exercise, and lifestyle modification support.                Nutrition Goals Discharge (Final Nutrition Goals Re-Evaluation):  Nutrition Goals Re-Evaluation - 03/10/24 1201       Goals   Current Weight 127 lb 13.9 oz (58 kg)    Expected Outcome Monica White has medical history of shortness of breath, mulitple hiatal hernia repair, gtube removal 01/2023. She did recently have another hernia surgery in 12/2023 and was discharged to SNF on a full liquid diet. At this time, she has advanced to a regular diet. Will continue to montior weight. She is motivated to gain weight; will continue to discuss strategies for weight gain. Patient will continue to benefit from participation in pulmonary rehab for nutrition, exercise, and lifestyle modification support.             Psychosocial: Target Goals: Acknowledge presence or absence of significant depression and/or stress, maximize coping skills, provide positive support system. Participant is able to verbalize types and ability to use techniques and skills needed for reducing stress and depression.  Initial Review & Psychosocial Screening:  Initial Psych Review & Screening - 03/02/24 0930       Initial Review   Current issues with Current Sleep Concerns       Family Dynamics   Good Support System? Yes      Barriers   Psychosocial barriers to participate in program The patient should benefit from training in stress management and relaxation.      Screening Interventions   Interventions Encouraged to exercise;Provide feedback about the scores to participant    Expected Outcomes Short Term goal: Utilizing psychosocial counselor, staff and physician to assist with identification of specific Stressors or current issues interfering with healing process. Setting desired goal for each stressor or current issue identified.;Long Term Goal: Stressors or current issues are controlled or eliminated.;Short Term goal: Identification and review with participant of any Quality of Life or Depression concerns found by scoring the questionnaire.;Long Term goal: The participant improves quality of Life and PHQ9 Scores as seen by post scores and/or verbalization of changes             Quality of Life Scores:  Scores of 19 and below usually indicate a poorer quality of life in these areas.  A difference of  2-3 points is a  clinically meaningful difference.  A difference of 2-3 points in the total score of the Quality of Life Index has been associated with significant improvement in overall quality of life, self-image, physical symptoms, and general health in studies assessing change in quality of life.  PHQ-9: Review Flowsheet       03/02/2024  Depression screen PHQ 2/9  Decreased Interest 0  Down, Depressed, Hopeless 0  PHQ - 2 Score 0  Altered sleeping 2  Tired, decreased energy 1  Change in appetite 1  Feeling bad or failure about yourself  0  Trouble concentrating 1  Moving slowly or fidgety/restless 0  Suicidal thoughts 0  PHQ-9 Score 5  Difficult doing work/chores Somewhat difficult   Interpretation of Total Score  Total Score Depression Severity:  1-4 = Minimal depression, 5-9 = Mild depression, 10-14 = Moderate depression, 15-19 = Moderately  severe depression, 20-27 = Severe depression   Psychosocial Evaluation and Intervention:  Psychosocial Evaluation - 03/02/24 1039       Psychosocial Evaluation & Interventions   Interventions Encouraged to exercise with the program and follow exercise prescription;Relaxation education    Comments Monica White is currently having issues with her sleep. She has trouble falling and staying asleep. Monica White does have a history of depression, but states she feels it is controlled with meds at this time. She denied needing a referral to a therapist.  Staff will provide Monica White with relaxation techniques to help with her sleep.    Expected Outcomes For Monica White to participate in PR free from any psychosocial barriers or concerns    Continue Psychosocial Services  No Follow up required             Psychosocial Re-Evaluation:  Psychosocial Re-Evaluation     Row Name 03/02/24 1441 04/01/24 1121           Psychosocial Re-Evaluation   Current issues with History of Depression;Current Psychotropic Meds;Current Sleep Concerns History of Depression;Current Psychotropic Meds;Current Sleep Concerns      Comments Monica White is scheduled to start PR on 03/10/24. No new concerns since orientation on 03/02/24. Monica White denies any new psychosocial barriers or concerns at this time. She feels like her sleep pattern is a little better since starting PR class. She is compliant with taking her psychotropic meds.      Expected Outcomes For pt to participate in PR free of any psychosocial barriers or concerns For pt to participate in PR free of any psychosocial barriers or concerns      Interventions Encouraged to attend Pulmonary Rehabilitation for the exercise Encouraged to attend Pulmonary Rehabilitation for the exercise      Continue Psychosocial Services  No Follow up required No Follow up required               Psychosocial Discharge (Final Psychosocial Re-Evaluation):  Psychosocial Re-Evaluation - 04/01/24 1121        Psychosocial Re-Evaluation   Current issues with History of Depression;Current Psychotropic Meds;Current Sleep Concerns    Comments Monica White denies any new psychosocial barriers or concerns at this time. She feels like her sleep pattern is a little better since starting PR class. She is compliant with taking her psychotropic meds.    Expected Outcomes For pt to participate in PR free of any psychosocial barriers or concerns    Interventions Encouraged to attend Pulmonary Rehabilitation for the exercise    Continue Psychosocial Services  No Follow up required  Education: Education Goals: Education classes will be provided on a weekly basis, covering required topics. Participant will state understanding/return demonstration of topics presented.  Learning Barriers/Preferences:  Learning Barriers/Preferences - 03/02/24 0934       Learning Barriers/Preferences   Learning Barriers Exercise Concerns    Learning Preferences None             Education Topics: Know Your Numbers Group instruction that is supported by a PowerPoint presentation. Instructor discusses importance of knowing and understanding resting, exercise, and post-exercise oxygen saturation, heart rate, and blood pressure. Oxygen saturation, heart rate, blood pressure, rating of perceived exertion, and dyspnea are reviewed along with a normal range for these values.    Exercise for the Pulmonary Patient Group instruction that is supported by a PowerPoint presentation. Instructor discusses benefits of exercise, core components of exercise, frequency, duration, and intensity of an exercise routine, importance of utilizing pulse oximetry during exercise, safety while exercising, and options of places to exercise outside of rehab.    MET Level  Group instruction provided by PowerPoint, verbal discussion, and written material to support subject matter. Instructor reviews what METs are and how to increase  METs.    Pulmonary Medications Verbally interactive group education provided by instructor with focus on inhaled medications and proper administration.   Anatomy and Physiology of the Respiratory System Group instruction provided by PowerPoint, verbal discussion, and written material to support subject matter. Instructor reviews respiratory cycle and anatomical components of the respiratory system and their functions. Instructor also reviews differences in obstructive and restrictive respiratory diseases with examples of each.    Oxygen Safety Group instruction provided by PowerPoint, verbal discussion, and written material to support subject matter. There is an overview of "What is Oxygen" and "Why do we need it".  Instructor also reviews how to create a safe environment for oxygen use, the importance of using oxygen as prescribed, and the risks of noncompliance. There is a brief discussion on traveling with oxygen and resources the patient may utilize.   Oxygen Use Group instruction provided by PowerPoint, verbal discussion, and written material to discuss how supplemental oxygen is prescribed and different types of oxygen supply systems. Resources for more information are provided.  Flowsheet Row PULMONARY REHAB OTHER RESPIRATORY from 03/12/2024 in [facility] for Heart, Vascular, & Lung Health  Date 03/12/24  Educator RT  Instruction Review Code 1- Verbalizes Understanding       Breathing Techniques Group instruction that is supported by demonstration and informational handouts. Instructor discusses the benefits of pursed lip and diaphragmatic breathing and detailed demonstration on how to perform both.  Flowsheet Row PULMONARY REHAB OTHER RESPIRATORY from 03/19/2024 in [facility] for Heart, Vascular, & Lung Health  Date 03/19/24  Educator RN  Instruction Review Code 1- Verbalizes Understanding        Risk Factor Reduction Group  instruction that is supported by a PowerPoint presentation. Instructor discusses the definition of a risk factor, different risk factors for pulmonary disease, and how the heart and lungs work together.   Pulmonary Diseases Group instruction provided by PowerPoint, verbal discussion, and written material to support subject matter. Instructor gives an overview of the different type of pulmonary diseases. There is also a discussion on risk factors and symptoms as well as ways to manage the diseases.   Stress and Energy Conservation Group instruction provided by PowerPoint, verbal discussion, and written material to support subject matter. Instructor gives an overview of stress and  the impact it can have on the body. Instructor also reviews ways to reduce stress. There is also a discussion on energy conservation and ways to conserve energy throughout the day. Flowsheet Row PULMONARY REHAB OTHER RESPIRATORY from 03/26/2024 in [facility] for Heart, Vascular, & Lung Health  Date 03/26/24  Educator RN  Instruction Review Code 1- Verbalizes Understanding       Warning Signs and Symptoms Group instruction provided by PowerPoint, verbal discussion, and written material to support subject matter. Instructor reviews warning signs and symptoms of stroke, heart attack, cold and flu. Instructor also reviews ways to prevent the spread of infection. Flowsheet Row PULMONARY REHAB OTHER RESPIRATORY from 04/02/2024 in [facility] for Heart, Vascular, & Lung Health  Date 04/02/24  Educator RN  Instruction Review Code 1- Verbalizes Understanding       Other Education Group or individual verbal, written, or video instructions that support the educational goals of the pulmonary rehab program.    Knowledge Questionnaire Score:  Knowledge Questionnaire Score - 03/02/24 1014       Knowledge Questionnaire Score   Pre Score 17/18             Core  Components/Risk Factors/Patient Goals at Admission:  Personal Goals and Risk Factors at Admission - 03/02/24 0935       Core Components/Risk Factors/Patient Goals on Admission    Weight Management Yes;Weight Gain    Intervention Weight Management: Develop a combined nutrition and exercise program designed to reach desired caloric intake, while maintaining appropriate intake of nutrient and fiber, sodium and fats, and appropriate energy expenditure required for the weight goal.;Weight Management: Provide education and appropriate resources to help participant work on and attain dietary goals.;Weight Management/Obesity: Establish reasonable short term and long term weight goals.;Obesity: Provide education and appropriate resources to help participant work on and attain dietary goals.    Expected Outcomes Short Term: Continue to assess and modify interventions until short term weight is achieved;Long Term: Adherence to nutrition and physical activity/exercise program aimed toward attainment of established weight goal;Weight Gain: Understanding of general recommendations for a high calorie, high protein meal plan that promotes weight gain by distributing calorie intake throughout the day with the consumption for 4-5 meals, snacks, and/or supplements;Understanding recommendations for meals to include 15-35% energy as protein, 25-35% energy from fat, 35-60% energy from carbohydrates, less than 200mg  of dietary cholesterol, 20-35 gm of total fiber daily;Understanding of distribution of calorie intake throughout the day with the consumption of 4-5 meals/snacks    Improve shortness of breath with ADL's Yes    Intervention Provide education, individualized exercise plan and daily activity instruction to help decrease symptoms of SOB with activities of daily living.    Expected Outcomes Short Term: Improve cardiorespiratory fitness to achieve a reduction of symptoms when performing ADLs;Long Term: Be able to perform  more ADLs without symptoms or delay the onset of symptoms             Core Components/Risk Factors/Patient Goals Review:   Goals and Risk Factor Review     Row Name 03/02/24 1443 04/01/24 1123           Core Components/Risk Factors/Patient Goals Review   Personal Goals Review Weight Management/Obesity;Improve shortness of breath with ADL's;Develop more efficient breathing techniques such as purse lipped breathing and diaphragmatic breathing and practicing self-pacing with activity. Weight Management/Obesity;Improve shortness of breath with ADL's;Develop more efficient breathing techniques such as purse lipped breathing and diaphragmatic breathing  and practicing self-pacing with activity.      Review Monica White is scheduled to start PR on 03/10/24. Unable to assess her goals at this time. Monthly review of patient's Core Components/Risk Factors/Patient Goals are as follows: Goal progressing for improving shortness of breath with ADL's. Monica White is currently exercising on RA to maintain sats >88%. She is currently exercising on the recumbant bike and the Nustep. Goal met for developing more efficient breathing techniques such as purse lipped breathing and diaphragmatic breathing; and practicing self-pacing with activity. Monica White has attended the breathing techniques education and has been practicing diaphragmatic breathing at home. She is able to demonstrate purse lip breathing when she gets SOB as well as self pace based on the RPE/dyspnea scale. Goal progressing for weight gain. Monica White is working with staff dietitian to achieve her weight gain goals.      Expected Outcomes To improve shortness of breath with ADL's, develop more efficient breathing techniques such as purse lipped breathing and diaphragmatic breathing; and practicing self-pacing with activity and gain weight. To improve shortness of breath with ADL's and gain weight.               Core Components/Risk Factors/Patient Goals at  Discharge (Final Review):   Goals and Risk Factor Review - 04/01/24 1123       Core Components/Risk Factors/Patient Goals Review   Personal Goals Review Weight Management/Obesity;Improve shortness of breath with ADL's;Develop more efficient breathing techniques such as purse lipped breathing and diaphragmatic breathing and practicing self-pacing with activity.    Review Monthly review of patient's Core Components/Risk Factors/Patient Goals are as follows: Goal progressing for improving shortness of breath with ADL's. Monica White is currently exercising on RA to maintain sats >88%. She is currently exercising on the recumbant bike and the Nustep. Goal met for developing more efficient breathing techniques such as purse lipped breathing and diaphragmatic breathing; and practicing self-pacing with activity. Monica White has attended the breathing techniques education and has been practicing diaphragmatic breathing at home. She is able to demonstrate purse lip breathing when she gets SOB as well as self pace based on the RPE/dyspnea scale. Goal progressing for weight gain. Walter is working with staff dietitian to achieve her weight gain goals.    Expected Outcomes To improve shortness of breath with ADL's and gain weight.             ITP Comments:Pt is making expected progress toward Pulmonary Rehab goals after completing 8 session(s). Recommend continued exercise, life style modification, education, and utilization of breathing techniques to increase stamina and strength, while also decreasing shortness of breath with exertion.  Dr. [name] is Medical Director for Pulmonary Rehab at [facility].     Comments:

## 2024-04-09 ENCOUNTER — Encounter (HOSPITAL_COMMUNITY)
Admission: RE | Admit: 2024-04-09 | Discharge: 2024-04-09 | Disposition: A | Discharge status: Home / Self Care | Source: Ambulatory Visit | Attending: Thoracic Surgery | Admitting: Thoracic Surgery

## 2024-04-09 DIAGNOSIS — R0602 Shortness of breath: Secondary | ICD-10-CM

## 2024-04-09 NOTE — Progress Notes (Signed)
 Daily Session Note  Patient Details  Name: Monica White MRN: [id number] Date of Birth: Feb 02, 1941 Referring Provider:   [name] Pulmonary Rehab Walk Test from 03/02/2024 in [facility] for Heart, Vascular, & Lung Health  Referring Provider [name]       Encounter Date: 04/09/2024  Check In:  Session Check In - 04/09/24 1021       Check-In   Supervising physician immediately available to respond to emergencies [facility] MD immediately available    Physician(s) [name], NP    Location [facility]    Staff Present [name], MS, ACSM-CEP, Exercise Physiologist;[name], ACSM-CEP, Exercise Physiologist;Alantis Bethune Felipe Horton, Graig Lawyer, RN, BSN;Johnny Porter, MS, Exercise Physiologist    Virtual Visit No    Medication changes reported     No    Fall or balance concerns reported    No    Tobacco Cessation No Change    Warm-up and Cool-down Performed as group-led instruction    Resistance Training Performed Yes    VAD Patient? No    PAD/SET Patient? No      Pain Assessment   Currently in Pain? No/denies    Multiple Pain Sites No             Capillary Blood Glucose: No results found for this or any previous visit (from the past 24 hours).    Social History   Tobacco Use  Smoking Status Never  Smokeless Tobacco Never    Goals Met:  Proper associated with RPD/PD & O2 Sat Independence with exercise equipment Exercise tolerated well No report of concerns or symptoms today Strength training completed today  Goals Unmet:  Not Applicable  Comments: Service time is from 1008 to 1135.    Dr. [name] is Medical Director for Pulmonary Rehab at [facility].

## 2024-04-10 DIAGNOSIS — M1712 Unilateral primary osteoarthritis, left knee: Secondary | ICD-10-CM | POA: Diagnosis not present

## 2024-04-14 ENCOUNTER — Encounter (HOSPITAL_COMMUNITY)
Admission: RE | Admit: 2024-04-14 | Discharge: 2024-04-14 | Disposition: A | Discharge status: Home / Self Care | Source: Ambulatory Visit | Attending: Thoracic Surgery | Admitting: Thoracic Surgery

## 2024-04-14 ENCOUNTER — Telehealth (HOSPITAL_COMMUNITY): Payer: Self-pay

## 2024-04-14 DIAGNOSIS — R0602 Shortness of breath: Secondary | ICD-10-CM

## 2024-04-14 NOTE — Telephone Encounter (Signed)
 LVM for pt to inform her that elevators are down for Pulm Rehab.

## 2024-04-14 NOTE — Progress Notes (Signed)
 Daily Session Note  Patient Details  Name: Monica White MRN: [id number] Date of Birth: 12-26-1940 Referring Provider:   [name] Pulmonary Rehab Walk Test from 03/02/2024 in [facility] for Heart, Vascular, & Lung Health  Referring Provider [name]       Encounter Date: 04/14/2024  Check In:  Session Check In - 04/14/24 1117       Check-In   Supervising physician immediately available to respond to emergencies [facility] MD immediately available    Physician(s) [name], NP    Location [facility]    Staff Present [name], MS, ACSM-CEP, Exercise Physiologist;[name], ACSM-CEP, Exercise Physiologist;Casey Carmen Chol, RN, BSN    Virtual Visit No    Medication changes reported     No    Fall or balance concerns reported    No    Tobacco Cessation No Change    Warm-up and Cool-down Performed as group-led instruction    Resistance Training Performed Yes    VAD Patient? No    PAD/SET Patient? No      Pain Assessment   Currently in Pain? No/denies    Multiple Pain Sites No             Capillary Blood Glucose: No results found for this or any previous visit (from the past 24 hours).    Social History   Tobacco Use  Smoking Status Never  Smokeless Tobacco Never    Goals Met:  Proper associated with RPD/PD & O2 Sat Exercise tolerated well No report of concerns or symptoms today Strength training completed today  Goals Unmet:  Not Applicable  Comments: Service time is from 1005 to 1126.    Dr. [name] is Medical Director for Pulmonary Rehab at [facility].

## 2024-04-14 NOTE — Progress Notes (Signed)
 Home Exercise Prescription I have reviewed a Home Exercise Prescription with Kee Pastel. Sharon is currently exercising at home. She walks 3-5 days/wk for 2x15 min/day. She does have access to a local fitness center. I agreed with Samirah's current home exericse plan and encouraged her to exercise inside if weather is ideal. She agreed with my recommnendations. I mentioned exercising on machines similar to ones at our facility. Shakelia voiced understanding.Completed home exercise plan. Jocelynn is currently exercising at home. She walks 3-5 days/wk for 2x15 min/day. She does have access to a local fitness center. I agreed with Mackenzee's current home exericse plan and encouraged her to exercise inside if weather is ideal. She agreed with my recommnendations. I mentioned exercising on machines similar to ones at our facility. Shyniece voiced understanding. The patient stated that their goals were to increase strength and stamina. We reviewed exercise guidelines, target heart rate during exercise, RPE Scale, weather conditions, endpoints for exercise, warmup and cool down. The patient is encouraged to come to me with any questions. I will continue to follow up with the patient to assist them with progression and safety. Spent 15 min with patient discussing home exercise plan and goals  Floretta Huron, MS, ACSM-CEP 04/14/2024 3:56 PM

## 2024-04-15 DIAGNOSIS — M1711 Unilateral primary osteoarthritis, right knee: Secondary | ICD-10-CM | POA: Diagnosis not present

## 2024-04-16 ENCOUNTER — Encounter (HOSPITAL_COMMUNITY)
Admission: RE | Admit: 2024-04-16 | Discharge: 2024-04-16 | Disposition: A | Discharge status: Home / Self Care | Source: Ambulatory Visit | Attending: Thoracic Surgery | Admitting: Thoracic Surgery

## 2024-04-16 DIAGNOSIS — R0602 Shortness of breath: Secondary | ICD-10-CM | POA: Diagnosis not present

## 2024-04-16 NOTE — Progress Notes (Signed)
 Daily Session Note  Patient Details  Name: Monica White MRN: [id number] Date of Birth: October 10, 1941 Referring Provider:   [name] Pulmonary Rehab Walk Test from 03/02/2024 in [facility] for Heart, Vascular, & Lung Health  Referring Provider [name]       Encounter Date: 04/16/2024  Check In:  Session Check In - 04/16/24 1029       Check-In   Supervising physician immediately available to respond to emergencies [facility] MD immediately available    Physician(s) Charles Connor, NP    [facility]    Staff Present [name], MS, ACSM-CEP, Exercise Physiologist;[name], ACSM-CEP, Exercise Physiologist;Huck Ashworth Carmen Chol, RN, BSN;Samantha Belarus, RD, LDN    Virtual Visit No    Medication changes reported     No    Fall or balance concerns reported    No    Tobacco Cessation No Change    Warm-up and Cool-down Performed as group-led instruction    Resistance Training Performed Yes    VAD Patient? No    PAD/SET Patient? No      Pain Assessment   Currently in Pain? No/denies             Capillary Blood Glucose: No results found for this or any previous visit (from the past 24 hours).    Social History   Tobacco Use  Smoking Status Never  Smokeless Tobacco Never    Goals Met:  Proper associated with RPD/PD & O2 Sat Independence with exercise equipment Exercise tolerated well No report of concerns or symptoms today Strength training completed today  Goals Unmet:  Not Applicable  Comments: Service time is from 1015 to 1137.    Dr. [name] is Medical Director for Pulmonary Rehab at [facility].

## 2024-04-21 ENCOUNTER — Encounter (HOSPITAL_COMMUNITY)
Admission: RE | Admit: 2024-04-21 | Discharge: 2024-04-21 | Disposition: A | Discharge status: Home / Self Care | Source: Ambulatory Visit | Attending: Thoracic Surgery | Admitting: Thoracic Surgery

## 2024-04-21 ENCOUNTER — Encounter (HOSPITAL_COMMUNITY)

## 2024-04-21 VITALS — Wt 133.2 lb

## 2024-04-21 DIAGNOSIS — R0602 Shortness of breath: Secondary | ICD-10-CM | POA: Diagnosis not present

## 2024-04-21 NOTE — Progress Notes (Signed)
 Daily Session Note  Patient Details  Name: Monica White MRN: [id number] Date of Birth: 09/29/1941 Referring Provider:   [name] Pulmonary Rehab Walk Test from 03/02/2024 in [facility] for Heart, Vascular, & Lung Health  Referring Provider [name]       Encounter Date: 04/21/2024  Check In:  Session Check In - 04/21/24 1024       Check-In   Supervising physician immediately available to respond to emergencies [facility] MD immediately available    Physician(s) Charles Connor, NP    Location [facility]    Staff Present [name], MS, ACSM-CEP, Exercise Physiologist;[name], ACSM-CEP, Exercise Physiologist;Noa Galvao Arlester Ladd, RN, BSN;Samantha Belarus, RD, LDN;Johnny Alexia Angelucci, MS, Exercise Physiologist    Virtual Visit No    Medication changes reported     No    Fall or balance concerns reported    No    Tobacco Cessation No Change    Warm-up and Cool-down Performed as group-led instruction    Resistance Training Performed Yes    VAD Patient? No    PAD/SET Patient? No      Pain Assessment   Currently in Pain? No/denies    Multiple Pain Sites No             Capillary Blood Glucose: No results found for this or any previous visit (from the past 24 hours).   Exercise Prescription Changes - 04/21/24 1200       Response to Exercise   Blood Pressure (Admit) 92/68    Blood Pressure (Exercise) 122/80    Blood Pressure (Exit) 110/60    Heart Rate (Admit) 72 bpm    Heart Rate (Exercise) 102 bpm    Heart Rate (Exit) 72 bpm    Oxygen Saturation (Admit) 96 %    Oxygen Saturation (Exercise) 96 %    Oxygen Saturation (Exit) 98 %    Rating of Perceived Exertion (Exercise) 13    Perceived Dyspnea (Exercise) 1    Duration Continue with 30 min of aerobic exercise without signs/symptoms of physical distress.    Intensity THRR unchanged      Progression   Progression Continue to progress workloads to maintain intensity without  signs/symptoms of physical distress.      Resistance Training   Training Prescription Yes    Weight red bands    Reps 10-15    Time 10 Minutes      Recumbant Bike   Level 3    RPM 66    Watts 43    Minutes 15    METs 3.7      NuStep   Level 3    SPM 82    Minutes 15    METs 2.6             Social History   Tobacco Use  Smoking Status Never  Smokeless Tobacco Never    Goals Met:  Exercise tolerated well No report of concerns or symptoms today Strength training completed today  Goals Unmet:  Not Applicable  Comments: Service time is from 1014 to 1131    Dr. [name] is Medical Director for Pulmonary Rehab at [facility].

## 2024-04-23 ENCOUNTER — Encounter (HOSPITAL_COMMUNITY)

## 2024-04-28 ENCOUNTER — Encounter (HOSPITAL_COMMUNITY)
Admission: RE | Admit: 2024-04-28 | Discharge: 2024-04-28 | Disposition: A | Discharge status: Home / Self Care | Source: Ambulatory Visit | Attending: Internal Medicine | Admitting: Internal Medicine

## 2024-04-28 DIAGNOSIS — R0602 Shortness of breath: Secondary | ICD-10-CM | POA: Diagnosis not present

## 2024-04-28 NOTE — Progress Notes (Signed)
 Daily Session Note  Patient Details  Name: Monica White MRN: [id number] Date of Birth: February 11, 1941 Referring Provider:   [name] Pulmonary Rehab Walk Test from 03/02/2024 in [facility]  Referring Provider [name]       Encounter Date: 04/28/2024  Check In:  Session Check In - 04/28/24 1021       Check-In   Supervising physician immediately available to respond to emergencies [facility] MD immediately available    Physician(s) Koren Persons, NP    Location [facility]    Staff Present [name], MS, ACSM-CEP, Exercise Physiologist;Wilbon Obenchain Rochelle Chu, ACSM-CEP, Exercise Physiologist;Johnny Alexia Angelucci, MS, Exercise Physiologist;Casey Felipe Horton, RT    Virtual Visit No    Medication changes reported     No    Fall or balance concerns reported    No    Tobacco Cessation No Change    Warm-up and Cool-down Performed as group-led instruction    Resistance Training Performed Yes    VAD Patient? No    PAD/SET Patient? No      Pain Assessment   Currently in Pain? No/denies    Multiple Pain Sites No             Capillary Blood Glucose: No results found for this or any previous visit (from the past 24 hours).    Social History   Tobacco Use  Smoking Status Never  Smokeless Tobacco Never    Goals Met:  Exercise tolerated well No report of concerns or symptoms today Strength training completed today  Goals Unmet:  Not Applicable  Comments: Service time is from 1008 to 1130.    Dr. [name] is Medical Director for Pulmonary Rehab at [facility].

## 2024-04-30 ENCOUNTER — Encounter (HOSPITAL_COMMUNITY)
Admission: RE | Admit: 2024-04-30 | Discharge: 2024-04-30 | Disposition: A | Discharge status: Home / Self Care | Source: Ambulatory Visit | Attending: Thoracic Surgery | Admitting: Thoracic Surgery

## 2024-04-30 DIAGNOSIS — R0602 Shortness of breath: Secondary | ICD-10-CM

## 2024-04-30 NOTE — Progress Notes (Signed)
 Daily Session Note  Patient Details  Name: Monica White MRN: [id number] Date of Birth: [birth year] Referring Provider:   [name] Pulmonary Rehab Walk Test from 03/02/2024 in [facility]  Referring Provider [name]       Encounter Date: 04/30/2024  Check In:  Session Check In - 04/30/24 1108       Check-In   Supervising physician immediately available to respond to emergencies [facility] MD immediately available    Physician(s) [name], NP    Location [facility]    Staff Present [name], MS, ACSM-CEP, Exercise Physiologist;[name], ACSM-CEP, Exercise Physiologist;[name], RD, LDN    Virtual Visit No    Medication changes reported     No    Fall or balance concerns reported    No    Tobacco Cessation No Change    Warm-up and Cool-down Performed as group-led instruction    Resistance Training Performed Yes    VAD Patient? No    PAD/SET Patient? No      Pain Assessment   Currently in Pain? No/denies    Multiple Pain Sites No             Capillary Blood Glucose: No results found for this or any previous visit (from the past 24 hours).    Social History   Tobacco Use  Smoking Status Never  Smokeless Tobacco Never    Goals Met:  Proper associated with RPD/PD & O2 Sat Exercise tolerated well No report of concerns or symptoms today Strength training completed today  Goals Unmet:  Not Applicable  Comments: Service time is from 1010 to 1136.  Dr. [name] is Medical Director for Pulmonary Rehab at [facility].

## 2024-05-05 ENCOUNTER — Encounter (HOSPITAL_COMMUNITY)
Admission: RE | Admit: 2024-05-05 | Discharge: 2024-05-05 | Disposition: A | Discharge status: Home / Self Care | Source: Ambulatory Visit | Attending: Thoracic Surgery | Admitting: Thoracic Surgery

## 2024-05-05 VITALS — Wt 129.4 lb

## 2024-05-05 DIAGNOSIS — R0602 Shortness of breath: Secondary | ICD-10-CM

## 2024-05-05 NOTE — Progress Notes (Signed)
 Daily Session Note  Patient Details  Name: Monica White MRN: [id number] Date of Birth: 1941-01-30 Referring Provider:   [name] Pulmonary Rehab Walk Test from 03/02/2024 in [facility]  Referring Provider [name]       Encounter Date: 05/05/2024  Check In:  Session Check In - 05/05/24 1120       Check-In   Supervising physician immediately available to respond to emergencies [facility] MD immediately available    Physician(s) [name], NP    Location [facility]    Staff Present [name], MS, ACSM-CEP, Exercise Physiologist;[name], ACSM-CEP, Exercise Physiologist;[name], RD, Toy Freund, RN, BSN    Virtual Visit No    Medication changes reported     No    Fall or balance concerns reported    No    Tobacco Cessation No Change    Warm-up and Cool-down Performed as group-led instruction    Resistance Training Performed Yes    VAD Patient? No    PAD/SET Patient? No      Pain Assessment   Currently in Pain? No/denies    Multiple Pain Sites No             Capillary Blood Glucose: No results found for this or any previous visit (from the past 24 hours).   Exercise Prescription Changes - 05/05/24 1100       Response to Exercise   Blood Pressure (Admit) 117/70    Blood Pressure (Exercise) 122/66    Blood Pressure (Exit) 104/60    Heart Rate (Admit) 74 bpm    Heart Rate (Exercise) 111 bpm    Heart Rate (Exit) 80 bpm    Oxygen Saturation (Admit) 97 %    Oxygen Saturation (Exercise) 96 %    Oxygen Saturation (Exit) 95 %    Rating of Perceived Exertion (Exercise) 13    Perceived Dyspnea (Exercise) 2    Duration Continue with 30 min of aerobic exercise without signs/symptoms of physical distress.    Intensity THRR unchanged      Progression   Progression Continue to progress workloads to maintain intensity without signs/symptoms of  physical distress.      Resistance Training   Training Prescription Yes    Weight BLUE BANDS    Reps 10-15    Time 10 Minutes      Recumbant Bike   Level 3    RPM 57    Watts 35    Minutes 15    METs 3.5      NuStep   Level 3    SPM 68    Minutes 15    METs 1.8             Social History   Tobacco Use  Smoking Status Never  Smokeless Tobacco Never    Goals Met:  Independence with exercise equipment Exercise tolerated well No report of concerns or symptoms today Strength training completed today  Goals Unmet:  Not Applicable  Comments: Service time is from 1014 to 1144    Dr. [name] is Medical Director for Pulmonary Rehab at [facility].

## 2024-05-06 NOTE — Progress Notes (Signed)
 Pulmonary Individual Treatment Plan  Patient Details  Name: Monica White MRN: [id number] Date of Birth: 08/15/1941 Referring Provider:   [name] Pulmonary Rehab Walk Test from 03/02/2024 in [facility]  Referring Provider Klapper  [Ellison]       Initial Encounter Date:  Flowsheet Row Pulmonary Rehab Walk Test from 03/02/2024 in [facility]  Date 03/02/24       Visit Diagnosis: Shortness of breath  Patient's Home Medications on Admission:   Current Outpatient Medications:    acetaminophen  (TYLENOL ) 325 MG tablet, Take 2 tablets (650 mg total) by mouth every 6 (six) hours as needed. (Patient taking differently: Take 650 mg by mouth every 6 (six) hours as needed for mild pain (pain score 1-3) or headache.), Disp: , Rfl:    Ascorbic Acid (VITAMIN C PO), Take 1 tablet by mouth daily., Disp: , Rfl:    cholecalciferol (VITAMIN D3) 25 MCG (1000 UNIT) tablet, Take 1,000 Units by mouth daily., Disp: , Rfl:    Cyanocobalamin  (B-12) 1000 MCG CAPS, Take 1,000 mcg by mouth daily., Disp: , Rfl:    desvenlafaxine (PRISTIQ) 25 MG 24 hr tablet, Take 25 mg by mouth in the morning., Disp: , Rfl:    estradiol  (ESTRACE ) 0.5 MG tablet, Take 0.5 mg by mouth daily., Disp: , Rfl:    levothyroxine  (SYNTHROID ) 75 MCG tablet, Take 75 mcg by mouth daily before breakfast., Disp: , Rfl:    medroxyPROGESTERone  (PROVERA ) 2.5 MG tablet, Take 1 tablet (2.5 mg total) by mouth daily., Disp: 90 tablet, Rfl: 3   Multiple Vitamins-Minerals (MULTIVITAMIN PO), Take 1 tablet by mouth daily with breakfast., Disp: , Rfl:    ondansetron  (ZOFRAN -ODT) 4 MG disintegrating tablet, Take 1 tablet (4 mg total) by mouth every 6 (six) hours as needed for nausea. (Patient not taking: Reported on 08/28/2023), Disp: 30 tablet, Rfl: 3   oxyCODONE  (OXY IR/ROXICODONE ) 5 MG immediate release tablet, Take 1 tablet (5 mg total) by  mouth every 6 (six) hours as needed for breakthrough pain. (Patient not taking: Reported on 08/28/2023), Disp: 15 tablet, Rfl: 0   pantoprazole  (PROTONIX ) 40 MG tablet, Take 1 tablet (40 mg total) by mouth 2 (two) times daily. (Patient not taking: Reported on 08/28/2023), Disp: 60 tablet, Rfl: 0   Probiotic Product (PROBIOTIC PO), Take 1 capsule by mouth in the morning., Disp: , Rfl:   Past Medical History: Past Medical History:  Diagnosis Date   Anemia    Arthritis    Dyspnea    GERD (gastroesophageal reflux disease)    History of hiatal hernia    Hypercholesteremia    Hypothyroid 2015   PONV (postoperative nausea and vomiting)     Tobacco Use: Social History   Tobacco Use  Smoking Status Never  Smokeless Tobacco Never    Labs: Review Flowsheet  More data exists      Latest Ref Rng & Units 03/26/2018 01/06/2021 12/01/2021 02/14/2023 02/18/2023  Labs for ITP Cardiac and Pulmonary Rehab  Cholestrol 100 - 199 mg/dL 865  784  - - -  LDL (calc) 0 - 99 mg/dL 90  696  - - -  HDL-C >29 mg/dL 64  57  - - -  Trlycerides <150 mg/dL 73  528  - 413  244   PH, Arterial 7.350 - 7.450 - - 7.376  - -  PCO2 arterial 32.0 - 48.0 mmHg - - 35.7  - -  Bicarbonate 20.0 - 28.0 mmol/L - - 20.9  21.6  - -  TCO2 22 - 32 mmol/L - - 22  23  - -  Acid-base deficit 0.0 - 2.0 mmol/L - - 4.0  4.0  - -  O2 Saturation % - - 97.0  70.0  - -    Details       Multiple values from one day are sorted in reverse-chronological order         Capillary Blood Glucose: Lab Results  Component Value Date   GLUCAP 117 (H) 02/16/2023   GLUCAP 166 (H) 02/16/2023   GLUCAP 123 (H) 02/16/2023   GLUCAP 138 (H) 02/15/2023   GLUCAP 146 (H) 02/14/2023     Pulmonary Assessment Scores:  Pulmonary Assessment Scores     Row Name 03/02/24 1013         ADL UCSD   ADL Phase Entry     SOB Score total 63       CAT Score   CAT Score 13       mMRC Score   mMRC Score 4             UCSD: Self-administered  rating of dyspnea associated with activities of daily living (ADLs) 6-point scale (0 = not at all to 5 = maximal or unable to do because of breathlessness)  Scoring Scores range from 0 to 120.  Minimally important difference is 5 units  CAT: CAT can identify the health impairment of COPD patients and is better correlated with disease progression.  CAT has a scoring range of zero to 40. The CAT score is classified into four groups of low (less than 10), medium (10 - 20), high (21-30) and very high (31-40) based on the impact level of disease on health status. A CAT score over 10 suggests significant symptoms.  A worsening CAT score could be explained by an exacerbation, poor medication adherence, poor inhaler technique, or progression of COPD or comorbid conditions.  CAT MCID is 2 points  mMRC: mMRC (Modified Medical Research Council) Dyspnea Scale is used to assess the degree of baseline functional disability in patients of respiratory disease due to dyspnea. No minimal important difference is established. A decrease in score of 1 point or greater is considered a positive change.   Pulmonary Function Assessment:  Pulmonary Function Assessment - 03/02/24 1043       Breath   Bilateral Breath Sounds Clear             Exercise Target Goals: Exercise Program Goal: Individual exercise prescription set using results from initial 6 min walk test and THRR while considering  patient's activity barriers and safety.   Exercise Prescription Goal: Initial exercise prescription builds to 30-45 minutes a day of aerobic activity, 2-3 days per week.  Home exercise guidelines will be given to patient during program as part of exercise prescription that the participant will acknowledge.  Activity Barriers & Risk Stratification:  Activity Barriers & Cardiac Risk Stratification - 03/02/24 0926       Activity Barriers & Cardiac Risk Stratification   Activity Barriers Deconditioning;Muscular  Weakness;Shortness of Breath   No bending, twisting, or lifting   Cardiac Risk Stratification Moderate             6 Minute Walk:  6 Minute Walk     Row Name 03/02/24 1038         6 Minute Walk   Phase Initial     Distance 680 feet  Walk Time 6 minutes     # of Rest Breaks 1  2:15-3:30     MPH 1.29     METS 1.4     RPE 13     Perceived Dyspnea  1     VO2 Peak 4.88     Symptoms No     Resting HR 82 bpm     Resting BP 106/70     Resting Oxygen Saturation  98 %     Exercise Oxygen Saturation  during 6 min walk 93 %     Max Ex. HR 125 bpm     Max Ex. BP 104/70     2 Minute Post BP 108/70       Interval HR   1 Minute HR 108     2 Minute HR 109     3 Minute HR 110     4 Minute HR 114     5 Minute HR 123     6 Minute HR 125     2 Minute Post HR 103     Interval Heart Rate? Yes       Interval Oxygen   Interval Oxygen? Yes     Baseline Oxygen Saturation % 98 %     1 Minute Oxygen Saturation % 97 %     1 Minute Liters of Oxygen 0 L     2 Minute Oxygen Saturation % 93 %     2 Minute Liters of Oxygen 0 L     3 Minute Oxygen Saturation % 98 %     3 Minute Liters of Oxygen 0 L     4 Minute Oxygen Saturation % 99 %     4 Minute Liters of Oxygen 0 L     5 Minute Oxygen Saturation % 98 %     5 Minute Liters of Oxygen 0 L     6 Minute Oxygen Saturation % 96 %     6 Minute Liters of Oxygen 0 L     2 Minute Post Oxygen Saturation % 96 %     2 Minute Post Liters of Oxygen 0 L              Oxygen Initial Assessment:  Oxygen Initial Assessment - 03/02/24 0928       Home Oxygen   Home Oxygen Device None    Sleep Oxygen Prescription None    Home Exercise Oxygen Prescription None    Home Resting Oxygen Prescription None      Initial 6 min Walk   Oxygen Used None      Program Oxygen Prescription   Program Oxygen Prescription None      Intervention   Short Term Goals To learn and understand importance of maintaining oxygen saturations>88%;To learn and  understand importance of monitoring SPO2 with pulse oximeter and demonstrate accurate use of the pulse oximeter.;To learn and demonstrate proper pursed lip breathing techniques or other breathing techniques.     Long  Term Goals Exhibits proper breathing techniques, such as pursed lip breathing or other method taught during program session;Verbalizes importance of monitoring SPO2 with pulse oximeter and return demonstration;Maintenance of O2 saturations>88%             Oxygen Re-Evaluation:  Oxygen Re-Evaluation     Row Name 03/04/24 1110 04/29/24 0945           Program Oxygen Prescription   Program Oxygen Prescription None None        Home  Oxygen   Home Oxygen Device None None      Sleep Oxygen Prescription None None      Home Exercise Oxygen Prescription None None      Home Resting Oxygen Prescription None None        Goals/Expected Outcomes   Short Term Goals To learn and understand importance of maintaining oxygen saturations>88%;To learn and understand importance of monitoring SPO2 with pulse oximeter and demonstrate accurate use of the pulse oximeter.;To learn and demonstrate proper pursed lip breathing techniques or other breathing techniques.  To learn and understand importance of maintaining oxygen saturations>88%;To learn and understand importance of monitoring SPO2 with pulse oximeter and demonstrate accurate use of the pulse oximeter.;To learn and demonstrate proper pursed lip breathing techniques or other breathing techniques.       Long  Term Goals Exhibits proper breathing techniques, such as pursed lip breathing or other method taught during program session;Verbalizes importance of monitoring SPO2 with pulse oximeter and return demonstration;Maintenance of O2 saturations>88% Exhibits proper breathing techniques, such as pursed lip breathing or other method taught during program session;Verbalizes importance of monitoring SPO2 with pulse oximeter and return  demonstration;Maintenance of O2 saturations>88%      Goals/Expected Outcomes Compliance and understanding of oxygen saturation and breathing techniques to decrease shortness of breath. Compliance and understanding of oxygen saturation and breathing techniques to decrease shortness of breath.               Oxygen Discharge (Final Oxygen Re-Evaluation):  Oxygen Re-Evaluation - 04/29/24 0945       Program Oxygen Prescription   Program Oxygen Prescription None      Home Oxygen   Home Oxygen Device None    Sleep Oxygen Prescription None    Home Exercise Oxygen Prescription None    Home Resting Oxygen Prescription None      Goals/Expected Outcomes   Short Term Goals To learn and understand importance of maintaining oxygen saturations>88%;To learn and understand importance of monitoring SPO2 with pulse oximeter and demonstrate accurate use of the pulse oximeter.;To learn and demonstrate proper pursed lip breathing techniques or other breathing techniques.     Long  Term Goals Exhibits proper breathing techniques, such as pursed lip breathing or other method taught during program session;Verbalizes importance of monitoring SPO2 with pulse oximeter and return demonstration;Maintenance of O2 saturations>88%    Goals/Expected Outcomes Compliance and understanding of oxygen saturation and breathing techniques to decrease shortness of breath.             Initial Exercise Prescription:  Initial Exercise Prescription - 03/02/24 1000       Date of Initial Exercise RX and Referring Provider   Date 03/02/24    Referring Provider [name]   Expected Discharge Date 05/28/24      Recumbant Bike   Level 1    RPM 20    Watts 15    Minutes 15    METs 1.9      NuStep   Level 1    SPM 72    Minutes 15    METs 1.5      Prescription Details   Frequency (times per week) 2    Duration Progress to 30 minutes of continuous aerobic without signs/symptoms of physical distress       Intensity   THRR 40-80% of Max Heartrate 55-110    Ratings of Perceived Exertion 11-13    Perceived Dyspnea 0-4      Progression   Progression Continue  progressive overload as per policy without signs/symptoms or physical distress.      Resistance Training   Training Prescription Yes    Weight red bands    Reps 10-15             Perform Capillary Blood Glucose checks as needed.  Exercise Prescription Changes:   Exercise Prescription Changes     Row Name 03/10/24 1100 03/19/24 0942 04/07/24 1100 04/21/24 1200 05/05/24 1100     Response to Exercise   Blood Pressure (Admit) 123/83 121/75 90/58 92/68  117/70   Blood Pressure (Exercise) 130/80 -- 124/70 122/80 122/66   Blood Pressure (Exit) 108/70 110/68 96/62 110/60 104/60   Heart Rate (Admit) 88 bpm 86 bpm 75 bpm 72 bpm 74 bpm   Heart Rate (Exercise) 87 bpm 107 bpm 92 bpm 102 bpm 111 bpm   Heart Rate (Exit) 88 bpm 84 bpm 81 bpm 72 bpm 80 bpm   Oxygen Saturation (Admit) 98 % 98 % 98 % 96 % 97 %   Oxygen Saturation (Exercise) 96 % 93 % 97 % 96 % 96 %   Oxygen Saturation (Exit) 98 % 98 % 94 % 98 % 95 %   Rating of Perceived Exertion (Exercise) 9 11 9 13 13    Perceived Dyspnea (Exercise) 0 1 1 1 2    Duration Progress to 30 minutes of  aerobic without signs/symptoms of physical distress Continue with 30 min of aerobic exercise without signs/symptoms of physical distress. Continue with 30 min of aerobic exercise without signs/symptoms of physical distress. Continue with 30 min of aerobic exercise without signs/symptoms of physical distress. Continue with 30 min of aerobic exercise without signs/symptoms of physical distress.   Intensity THRR unchanged THRR unchanged THRR unchanged THRR unchanged THRR unchanged     Progression   Progression Continue to progress workloads to maintain intensity without signs/symptoms of physical distress. Continue to progress workloads to maintain intensity without signs/symptoms of physical distress.  Continue to progress workloads to maintain intensity without signs/symptoms of physical distress. Continue to progress workloads to maintain intensity without signs/symptoms of physical distress. Continue to progress workloads to maintain intensity without signs/symptoms of physical distress.     Resistance Training   Training Prescription Yes Yes Yes Yes Yes   Weight red bands red bands red bands red bands BLUE BANDS   Reps 10-15 10-15 10-15 10-15 10-15   Time 10 Minutes 10 Minutes 10 Minutes 10 Minutes 10 Minutes     Recumbant Bike   Level 1 3 3 3 3    RPM -- 54 -- 66 57   Watts -- -- -- 43 35   Minutes 15 15 15 15 15    METs 1.9 3.1 3 3.7 3.5     NuStep   Level 1 2 3 3 3    SPM 73 75 -- 82 68   Minutes 15 15 15 15 15    METs 2.1 2.3 2.3 2.6 1.8            Exercise Comments:   Exercise Comments     Row Name 03/10/24 1156 04/14/24 1545         Exercise Comments Pt completed first day of exercise. She exercised for 15 min on the recumbent bike and Nustep. Khloee averaged 1.9 METs at level 1 on the recumbent bike and 2.1 METs at level 1 on the Nustep. She performed the warmup and coodown standing without limitations. Discussed METs. Completed home exercise plan. Cyril is currently exercising at home. She walks  3-5 days/wk for 2x15 min/day. She does have access to a local fitness center. I agreed with Thi's current home exericse plan and encouraged her to exercise inside if weather is ideal. She agreed with my recommnendations. I mentioned exercising on machines similar to ones at our facility. Kenadi voiced understanding.               Exercise Goals and Review:   Exercise Goals     Row Name 03/02/24 0927             Exercise Goals   Increase Physical Activity Yes       Intervention Develop an individualized exercise prescription for aerobic and resistive training based on initial evaluation findings, risk stratification, comorbidities and participant's  personal goals.;Provide advice, education, support and counseling about physical activity/exercise needs.       Expected Outcomes Short Term: Attend rehab on a regular basis to increase amount of physical activity.;Long Term: Exercising regularly at least 3-5 days a week.;Long Term: Add in home exercise to make exercise part of routine and to increase amount of physical activity.       Increase Strength and Stamina Yes       Intervention Provide advice, education, support and counseling about physical activity/exercise needs.;Develop an individualized exercise prescription for aerobic and resistive training based on initial evaluation findings, risk stratification, comorbidities and participant's personal goals.       Expected Outcomes Short Term: Increase workloads from initial exercise prescription for resistance, speed, and METs.;Short Term: Perform resistance training exercises routinely during rehab and add in resistance training at home;Long Term: Improve cardiorespiratory fitness, muscular endurance and strength as measured by increased METs and functional capacity ( )       Able to understand and use rate of perceived exertion (RPE) scale Yes       Intervention Provide education and explanation on how to use RPE scale       Expected Outcomes Short Term: Able to use RPE daily in rehab to express subjective intensity level;Long Term:  Able to use RPE to guide intensity level when exercising independently       Able to understand and use Dyspnea scale Yes       Intervention Provide education and explanation on how to use Dyspnea scale       Expected Outcomes Short Term: Able to use Dyspnea scale daily in rehab to express subjective sense of shortness of breath during exertion;Long Term: Able to use Dyspnea scale to guide intensity level when exercising independently       Knowledge and understanding of Target Heart Rate Range (THRR) Yes       Intervention Provide education and explanation of THRR  including how the numbers were predicted and where they are located for reference       Expected Outcomes Short Term: Able to state/look up THRR;Short Term: Able to use daily as guideline for intensity in rehab;Long Term: Able to use THRR to govern intensity when exercising independently       Understanding of Exercise Prescription Yes       Intervention Provide education, explanation, and written materials on patient's individual exercise prescription       Expected Outcomes Short Term: Able to explain program exercise prescription;Long Term: Able to explain home exercise prescription to exercise independently                Exercise Goals Re-Evaluation :  Exercise Goals Re-Evaluation     Row Name 03/04/24 1108 04/03/24  9528 04/29/24 0943         Exercise Goal Re-Evaluation   Exercise Goals Review Increase Physical Activity;Able to understand and use Dyspnea scale;Understanding of Exercise Prescription;Increase Strength and Stamina;Knowledge and understanding of Target Heart Rate Range (THRR);Able to understand and use rate of perceived exertion (RPE) scale Increase Physical Activity;Able to understand and use Dyspnea scale;Understanding of Exercise Prescription;Increase Strength and Stamina;Knowledge and understanding of Target Heart Rate Range (THRR);Able to understand and use rate of perceived exertion (RPE) scale Increase Physical Activity;Able to understand and use Dyspnea scale;Understanding of Exercise Prescription;Increase Strength and Stamina;Knowledge and understanding of Target Heart Rate Range (THRR);Able to understand and use rate of perceived exertion (RPE) scale     Comments Tamera is scheduled to begin exercise on 4/15. Will continue to monitor and progress as able. Weltha has completed 7 exercise sessions. She exercises for 15 min on the recumbent bike and Nustep. She averages 3.4 METs at level 3 on the recumbent elliptical and 2.2 METs at level 3 on the Nustep. Gulianna  performs the warmup and cooldown standing without limitations. Lucillia has increased her level for both exercise modes several times. She tolerates progressions well. Will continue to monitor and progress as able. Lorane has completed 13 exercise sessions. She exercises for 15 min on the recumbent bike and Nustep. She averages 3.1 METs at level 3 on the recumbent bike and 2.1 METs at level 3 on the Nustep. Permelia performs the warmup and cooldown standing without limitations. Janei has been difficult to progress recently due to her taking time off for vacations. We have recently discussed home exercise as Jimmye Moulds is currently exercising at home. Will continue to monitor and progress as able.     Expected Outcomes Through exercise at rehab and home, the patient will decrease shortness of breath and feel confident in carrying out an exercise regimen at home. Through exercise at rehab and home, the patient will decrease shortness of breath and feel confident in carrying out an exercise regimen at home. Through exercise at rehab and home, the patient will decrease shortness of breath and feel confident in carrying out an exercise regimen at home.              Discharge Exercise Prescription (Final Exercise Prescription Changes):  Exercise Prescription Changes - 05/05/24 1100       Response to Exercise   Blood Pressure (Admit) 117/70    Blood Pressure (Exercise) 122/66    Blood Pressure (Exit) 104/60    Heart Rate (Admit) 74 bpm    Heart Rate (Exercise) 111 bpm    Heart Rate (Exit) 80 bpm    Oxygen Saturation (Admit) 97 %    Oxygen Saturation (Exercise) 96 %    Oxygen Saturation (Exit) 95 %    Rating of Perceived Exertion (Exercise) 13    Perceived Dyspnea (Exercise) 2    Duration Continue with 30 min of aerobic exercise without signs/symptoms of physical distress.    Intensity THRR unchanged      Progression   Progression Continue to progress workloads to maintain intensity without  signs/symptoms of physical distress.      Resistance Training   Training Prescription Yes    Weight BLUE BANDS    Reps 10-15    Time 10 Minutes      Recumbant Bike   Level 3    RPM 57    Watts 35    Minutes 15    METs 3.5      NuStep  Level 3    SPM 68    Minutes 15    METs 1.8             Nutrition:  Target Goals: Understanding of nutrition guidelines, daily intake of sodium 1500mg , cholesterol 200mg , calories 30% from fat and 7% or less from saturated fats, daily to have 5 or more servings of fruits and vegetables.  Biometrics:  Pre Biometrics - 03/02/24 0916       Pre Biometrics   Grip Strength 15 kg              Nutrition Therapy Plan and Nutrition Goals:  Nutrition Therapy & Goals - 04/09/24 1110       Nutrition Therapy   Diet General Healthy Diet      Personal Nutrition Goals   Nutrition Goal Patient to improve diet quality by using the plate method as a guide for meal planning to include lean protein/plant protein, fruits, vegetables, whole grains, nonfat dairy as part of a well-balanced diet.   goal in progress.   Personal Goal #2 Patient to identify strategies for weight gain of 0.5-2.0# per week.   goal in progress.   Comments Goals in progress. Kaidan has medical history of shortness of breath, mulitple hiatal hernia repair, gtube removal 01/2023. She did recently have another hernia surgery in 12/2023 and was discharged to SNF on a full liquid diet. At this time, she has advanced to a regular diet. She is motivated to gain weight and she is up 4.2# since starting with our program. We have discussed multiple strategies for weight gain including eating frequency, nutrition supplements, etc. She typically drinks one protein drink daily. Patient will continue to benefit from participation in pulmonary rehab for nutrition, exercise, and lifestyle modification support.      Intervention Plan   Intervention Prescribe, educate and counsel regarding  individualized specific dietary modifications aiming towards targeted core components such as weight, hypertension, lipid management, diabetes, heart failure and other comorbidities.;Nutrition handout(s) given to patient.    Expected Outcomes Short Term Goal: Understand basic principles of dietary content, such as calories, fat, sodium, cholesterol and nutrients.;Long Term Goal: Adherence to prescribed nutrition plan.             Nutrition Assessments:  MEDIFICTS Score Key: >=70 Need to make dietary changes  40-70 Heart Healthy Diet <= 40 Therapeutic Level Cholesterol Diet   Picture Your Plate Scores: <16 Unhealthy dietary pattern with much room for improvement. 41-50 Dietary pattern unlikely to meet recommendations for good health and room for improvement. 51-60 More healthful dietary pattern, with some room for improvement.  >60 Healthy dietary pattern, although there may be some specific behaviors that could be improved.    Nutrition Goals Re-Evaluation:  Nutrition Goals Re-Evaluation     Row Name 03/10/24 1201 04/09/24 1110           Goals   Current Weight 127 lb 13.9 oz (58 kg) 132 lb 0.9 oz (59.9 kg)      Expected Outcome Rumaisa has medical history of shortness of breath, mulitple hiatal hernia repair, gtube removal 01/2023. She did recently have another hernia surgery in 12/2023 and was discharged to SNF on a full liquid diet. At this time, she has advanced to a regular diet. Will continue to montior weight. She is motivated to gain weight; will continue to discuss strategies for weight gain. Patient will continue to benefit from participation in pulmonary rehab for nutrition, exercise, and lifestyle modification support. Goals in  progress. Jolicia has medical history of shortness of breath, mulitple hiatal hernia repair, gtube removal 01/2023. She did recently have another hernia surgery in 12/2023 and was discharged to SNF on a full liquid diet. At this time, she has advanced  to a regular diet. She is motivated to gain weight and she is up 4.2# since starting with our program. We have discussed multiple strategies for weight gain including eating frequency, nutrition supplements, etc. She typically drinks one protein drink daily. Patient will continue to benefit from participation in pulmonary rehab for nutrition, exercise, and lifestyle modification support.               Nutrition Goals Discharge (Final Nutrition Goals Re-Evaluation):  Nutrition Goals Re-Evaluation - 04/09/24 1110       Goals   Current Weight 132 lb 0.9 oz (59.9 kg)    Expected Outcome Goals in progress. Nikitta has medical history of shortness of breath, mulitple hiatal hernia repair, gtube removal 01/2023. She did recently have another hernia surgery in 12/2023 and was discharged to SNF on a full liquid diet. At this time, she has advanced to a regular diet. She is motivated to gain weight and she is up 4.2# since starting with our program. We have discussed multiple strategies for weight gain including eating frequency, nutrition supplements, etc. She typically drinks one protein drink daily. Patient will continue to benefit from participation in pulmonary rehab for nutrition, exercise, and lifestyle modification support.             Psychosocial: Target Goals: Acknowledge presence or absence of significant depression and/or stress, maximize coping skills, provide positive support system. Participant is able to verbalize types and ability to use techniques and skills needed for reducing stress and depression.  Initial Review & Psychosocial Screening:  Initial Psych Review & Screening - 03/02/24 0930       Initial Review   Current issues with Current Sleep Concerns      Family Dynamics   Good Support System? Yes      Barriers   Psychosocial barriers to participate in program The patient should benefit from training in stress management and relaxation.      Screening Interventions    Interventions Encouraged to exercise;Provide feedback about the scores to participant    Expected Outcomes Short Term goal: Utilizing psychosocial counselor, staff and physician to assist with identification of specific Stressors or current issues interfering with healing process. Setting desired goal for each stressor or current issue identified.;Long Term Goal: Stressors or current issues are controlled or eliminated.;Short Term goal: Identification and review with participant of any Quality of Life or Depression concerns found by scoring the questionnaire.;Long Term goal: The participant improves quality of Life and PHQ9 Scores as seen by post scores and/or verbalization of changes             Quality of Life Scores:  Scores of 19 and below usually indicate a poorer quality of life in these areas.  A difference of  2-3 points is a clinically meaningful difference.  A difference of 2-3 points in the total score of the Quality of Life Index has been associated with significant improvement in overall quality of life, self-image, physical symptoms, and general health in studies assessing change in quality of life.  PHQ-9: Review Flowsheet       03/02/2024  Depression screen PHQ 2/9  Decreased Interest 0  Down, Depressed, Hopeless 0  PHQ - 2 Score 0  Altered sleeping 2  Tired,  decreased energy 1  Change in appetite 1  Feeling bad or failure about yourself  0  Trouble concentrating 1  Moving slowly or fidgety/restless 0  Suicidal thoughts 0  PHQ-9 Score 5  Difficult doing work/chores Somewhat difficult   Interpretation of Total Score  Total Score Depression Severity:  1-4 = Minimal depression, 5-9 = Mild depression, 10-14 = Moderate depression, 15-19 = Moderately severe depression, 20-27 = Severe depression   Psychosocial Evaluation and Intervention:  Psychosocial Evaluation - 03/02/24 1039       Psychosocial Evaluation & Interventions   Interventions Encouraged to exercise  with the program and follow exercise prescription;Relaxation education    Comments Jenelle is currently having issues with her sleep. She has trouble falling and staying asleep. Kelilah does have a history of depression, but states she feels it is controlled with meds at this time. She denied needing a referral to a therapist.  Staff will provide Angeli with relaxation techniques to help with her sleep.    Expected Outcomes For Shronda to participate in PR free from any psychosocial barriers or concerns    Continue Psychosocial Services  No Follow up required             Psychosocial Re-Evaluation:  Psychosocial Re-Evaluation     Row Name 03/02/24 1441 04/01/24 1121 04/28/24 1535         Psychosocial Re-Evaluation   Current issues with History of Depression;Current Psychotropic Meds;Current Sleep Concerns History of Depression;Current Psychotropic Meds;Current Sleep Concerns History of Depression;Current Psychotropic Meds     Comments Chrisoula is scheduled to start PR on 03/10/24. No new concerns since orientation on 03/02/24. Jawanda denies any new psychosocial barriers or concerns at this time. She feels like her sleep pattern is a little better since starting PR class. She is compliant with taking her psychotropic meds. Ahriana continues to deny any psychosocial barriers or concerns at this time. She is still compliant with taking her psychotropic meds.     Expected Outcomes For pt to participate in PR free of any psychosocial barriers or concerns For pt to participate in PR free of any psychosocial barriers or concerns For pt to participate in PR free of any psychosocial barriers or concerns     Interventions Encouraged to attend Pulmonary Rehabilitation for the exercise Encouraged to attend Pulmonary Rehabilitation for the exercise Encouraged to attend Pulmonary Rehabilitation for the exercise     Continue Psychosocial Services  No Follow up required No Follow up required No Follow up  required              Psychosocial Discharge (Final Psychosocial Re-Evaluation):  Psychosocial Re-Evaluation - 04/28/24 1535       Psychosocial Re-Evaluation   Current issues with History of Depression;Current Psychotropic Meds    Comments Jaclin continues to deny any psychosocial barriers or concerns at this time. She is still compliant with taking her psychotropic meds.    Expected Outcomes For pt to participate in PR free of any psychosocial barriers or concerns    Interventions Encouraged to attend Pulmonary Rehabilitation for the exercise    Continue Psychosocial Services  No Follow up required             Education: Education Goals: Education classes will be provided on a weekly basis, covering required topics. Participant will state understanding/return demonstration of topics presented.  Learning Barriers/Preferences:  Learning Barriers/Preferences - 03/02/24 0934       Learning Barriers/Preferences   Learning Barriers Exercise Concerns  Learning Preferences None             Education Topics: Know Your Numbers Group instruction that is supported by a PowerPoint presentation. Instructor discusses importance of knowing and understanding resting, exercise, and post-exercise oxygen saturation, heart rate, and blood pressure. Oxygen saturation, heart rate, blood pressure, rating of perceived exertion, and dyspnea are reviewed along with a normal range for these values.    Exercise for the Pulmonary Patient Group instruction that is supported by a PowerPoint presentation. Instructor discusses benefits of exercise, core components of exercise, frequency, duration, and intensity of an exercise routine, importance of utilizing pulse oximetry during exercise, safety while exercising, and options of places to exercise outside of rehab.    MET Level  Group instruction provided by PowerPoint, verbal discussion, and written material to support subject matter.  Instructor reviews what METs are and how to increase METs.  Flowsheet Row PULMONARY REHAB OTHER RESPIRATORY from 04/16/2024 in [facility], V[facility], [facility]  Date 04/16/24  Educator EP  Instruction Review Code 1- Verbalizes Understanding       Pulmonary Medications Verbally interactive group education provided by instructor with focus on inhaled medications and proper administration.   Anatomy and Physiology of the Respiratory System Group instruction provided by PowerPoint, verbal discussion, and written material to support subject matter. Instructor reviews respiratory cycle and anatomical components of the respiratory system and their functions. Instructor also reviews differences in obstructive and restrictive respiratory diseases with examples of each.  Flowsheet Row PULMONARY REHAB OTHER RESPIRATORY from 04/30/2024 in [facility] for [facility], Vascular, & [facility]  Date 04/30/24  Educator RT  Instruction Review Code 1- Verbalizes Understanding       Oxygen Safety Group instruction provided by PowerPoint, verbal discussion, and written material to support subject matter. There is an overview of "What is Oxygen" and "Why do we need it".  Instructor also reviews how to create a safe environment for oxygen use, the importance of using oxygen as prescribed, and the risks of noncompliance. There is a brief discussion on traveling with oxygen and resources the patient may utilize.   Oxygen Use Group instruction provided by PowerPoint, verbal discussion, and written material to discuss how supplemental oxygen is prescribed and different types of oxygen supply systems. Resources for more information are provided.  Flowsheet Row PULMONARY REHAB OTHER RESPIRATORY from 03/12/2024 in [facility] for Heart, Vascular, & Lung Health  Date 03/12/24  Educator RT  Instruction Review Code 1- Verbalizes  Understanding       Breathing Techniques Group instruction that is supported by demonstration and informational handouts. Instructor discusses the benefits of pursed lip and diaphragmatic breathing and detailed demonstration on how to perform both.  Flowsheet Row PULMONARY REHAB OTHER RESPIRATORY from 03/19/2024 in [facility] for Heart, Vascular, & Lung Health  Date 03/19/24  Educator RN  Instruction Review Code 1- Verbalizes Understanding        Risk Factor Reduction Group instruction that is supported by a PowerPoint presentation. Instructor discusses the definition of a risk factor, different risk factors for pulmonary disease, and how the heart and lungs work together. Flowsheet Row PULMONARY REHAB OTHER RESPIRATORY from 04/09/2024 in [facility] for Heart, Vascular, & Lung Health  Date 04/09/24  Educator EP  Instruction Review Code 1- Verbalizes Understanding       Pulmonary Diseases Group instruction provided by PowerPoint, verbal discussion, and  written material to support subject matter. Instructor gives an overview of the different type of pulmonary diseases. There is also a discussion on risk factors and symptoms as well as ways to manage the diseases.   Stress and Energy Conservation Group instruction provided by PowerPoint, verbal discussion, and written material to support subject matter. Instructor gives an overview of stress and the impact it can have on the body. Instructor also reviews ways to reduce stress. There is also a discussion on energy conservation and ways to conserve energy throughout the day. Flowsheet Row PULMONARY REHAB OTHER RESPIRATORY from 03/26/2024 in [facility] for Heart, Vascular, & Lung Health  Date 03/26/24  Educator RN  Instruction Review Code 1- Verbalizes Understanding       Warning Signs and Symptoms Group instruction provided by PowerPoint, verbal discussion, and  written material to support subject matter. Instructor reviews warning signs and symptoms of stroke, heart attack, cold and flu. Instructor also reviews ways to prevent the spread of infection. Flowsheet Row PULMONARY REHAB OTHER RESPIRATORY from 04/02/2024 in [facility] for Heart, Vascular, & Lung Health  Date 04/02/24  Educator RN  Instruction Review Code 1- Verbalizes Understanding       Other Education Group or individual verbal, written, or video instructions that support the educational goals of the pulmonary rehab program.    Knowledge Questionnaire Score:  Knowledge Questionnaire Score - 03/02/24 1014       Knowledge Questionnaire Score   Pre Score 17/18             Core Components/Risk Factors/Patient Goals at Admission:  Personal Goals and Risk Factors at Admission - 03/02/24 0935       Core Components/Risk Factors/Patient Goals on Admission    Weight Management Yes;Weight Gain    Intervention Weight Management: Develop a combined nutrition and exercise program designed to reach desired caloric intake, while maintaining appropriate intake of nutrient and fiber, sodium and fats, and appropriate energy expenditure required for the weight goal.;Weight Management: Provide education and appropriate resources to help participant work on and attain dietary goals.;Weight Management/Obesity: Establish reasonable short term and long term weight goals.;Obesity: Provide education and appropriate resources to help participant work on and attain dietary goals.    Expected Outcomes Short Term: Continue to assess and modify interventions until short term weight is achieved;Long Term: Adherence to nutrition and physical activity/exercise program aimed toward attainment of established weight goal;Weight Gain: Understanding of general recommendations for a high calorie, high protein meal plan that promotes weight gain by distributing calorie intake throughout the day  with the consumption for 4-5 meals, snacks, and/or supplements;Understanding recommendations for meals to include 15-35% energy as protein, 25-35% energy from fat, 35-60% energy from carbohydrates, less than 200mg  of dietary cholesterol, 20-35 gm of total fiber daily;Understanding of distribution of calorie intake throughout the day with the consumption of 4-5 meals/snacks    Improve shortness of breath with ADL's Yes    Intervention Provide education, individualized exercise plan and daily activity instruction to help decrease symptoms of SOB with activities of daily living.    Expected Outcomes Short Term: Improve cardiorespiratory fitness to achieve a reduction of symptoms when performing ADLs;Long Term: Be able to perform more ADLs without symptoms or delay the onset of symptoms             Core Components/Risk Factors/Patient Goals Review:   Goals and Risk Factor Review     Row Name 03/02/24 1443 04/01/24 1123 04/28/24  1537         Core Components/Risk Factors/Patient Goals Review   Personal Goals Review Weight Management/Obesity;Improve shortness of breath with ADL's;Develop more efficient breathing techniques such as purse lipped breathing and diaphragmatic breathing and practicing self-pacing with activity. Weight Management/Obesity;Improve shortness of breath with ADL's;Develop more efficient breathing techniques such as purse lipped breathing and diaphragmatic breathing and practicing self-pacing with activity. Weight Management/Obesity;Improve shortness of breath with ADL's     Review Ariauna is scheduled to start PR on 03/10/24. Unable to assess her goals at this time. Monthly review of patient's Core Components/Risk Factors/Patient Goals are as follows: Goal progressing for improving shortness of breath with ADL's. Emeree is currently exercising on RA to maintain sats >88%. She is currently exercising on the recumbant bike and the Nustep. Goal met for developing more efficient  breathing techniques such as purse lipped breathing and diaphragmatic breathing; and practicing self-pacing with activity. Alyia has attended the breathing techniques education and has been practicing diaphragmatic breathing at home. She is able to demonstrate purse lip breathing when she gets SOB as well as self pace based on the RPE/dyspnea scale. Goal progressing for weight gain. Loucille is working with staff dietitian to achieve her weight gain goals. Monthly review of patient's Core Components/Risk Factors/Patient Goals are as follows: Goal progressing for improving shortness of breath with ADL's. Elinor is currently exercising on RA to maintain sats >88%. She is currently exercising on the recumbant bike and the Nustep. Goal progressing for weight gain. Rahcel is working with staff dietitian to achieve her weight gain goals.     Expected Outcomes To improve shortness of breath with ADL's, develop more efficient breathing techniques such as purse lipped breathing and diaphragmatic breathing; and practicing self-pacing with activity and gain weight. To improve shortness of breath with ADL's and gain weight. To improve shortness of breath with ADL's and gain weight.              Core Components/Risk Factors/Patient Goals at Discharge (Final Review):   Goals and Risk Factor Review - 04/28/24 1537       Core Components/Risk Factors/Patient Goals Review   Personal Goals Review Weight Management/Obesity;Improve shortness of breath with ADL's    Review Monthly review of patient's Core Components/Risk Factors/Patient Goals are as follows: Goal progressing for improving shortness of breath with ADL's. Reine is currently exercising on RA to maintain sats >88%. She is currently exercising on the recumbant bike and the Nustep. Goal progressing for weight gain. Kayna is working with staff dietitian to achieve her weight gain goals.    Expected Outcomes To improve shortness of breath with ADL's and  gain weight.             ITP Comments:Pt is making expected progress toward Pulmonary Rehab goals after completing 15 session(s). Recommend continued exercise, life style modification, education, and utilization of breathing techniques to increase stamina and strength, while also decreasing shortness of breath with exertion.  Dr. [name] is Medical Director for Pulmonary Rehab at [facility].

## 2024-05-07 ENCOUNTER — Encounter (HOSPITAL_COMMUNITY)
Admission: RE | Admit: 2024-05-07 | Discharge: 2024-05-07 | Disposition: A | Discharge status: Home / Self Care | Source: Ambulatory Visit | Attending: Thoracic Surgery | Admitting: Thoracic Surgery

## 2024-05-07 DIAGNOSIS — R0602 Shortness of breath: Secondary | ICD-10-CM

## 2024-05-07 NOTE — Progress Notes (Signed)
 Daily Session Note  Patient Details  Name: Monica White MRN: [id number] Date of Birth: 1941/10/06 Referring Provider:   [name] Pulmonary Rehab Walk Test from 03/02/2024 in [facility] for Heart, Vascular, & Lung Health  Referring Provider [name]    Encounter Date: 05/07/2024  Check In:  Session Check In - 05/07/24 1026       Check-In   Supervising physician immediately available to respond to emergencies [facility] MD immediately available    Physician(s) Slater Duncan, NP    Location [facility]    Staff Present [name], MS, ACSM-CEP, Exercise Physiologist;Amari Zagal Rochelle Chu, ACSM-CEP, Exercise Physiologist;[name], RD, Toy Freund, RN, BSN    Virtual Visit No    Medication changes reported     No    Fall or balance concerns reported    No    Tobacco Cessation No Change    Warm-up and Cool-down Performed as group-led instruction    Resistance Training Performed Yes    VAD Patient? No    PAD/SET Patient? No      Pain Assessment   Currently in Pain? No/denies          Capillary Blood Glucose: No results found for this or any previous visit (from the past 24 hours).    Social History   Tobacco Use  Smoking Status Never  Smokeless Tobacco Never    Goals Met:  Independence with exercise equipment Exercise tolerated well No report of concerns or symptoms today Strength training completed today  Goals Unmet:  Not Applicable  Comments: Service time is from 0100 to 1137.    Dr. [name] is Medical Director for Pulmonary Rehab at [facility].

## 2024-05-12 ENCOUNTER — Encounter (HOSPITAL_COMMUNITY)
Admission: RE | Admit: 2024-05-12 | Discharge: 2024-05-12 | Disposition: A | Discharge status: Home / Self Care | Source: Ambulatory Visit | Attending: Thoracic Surgery | Admitting: Thoracic Surgery

## 2024-05-12 DIAGNOSIS — R0602 Shortness of breath: Secondary | ICD-10-CM | POA: Diagnosis not present

## 2024-05-12 NOTE — Progress Notes (Signed)
 Daily Session Note  Patient Details  Name: Monica White MRN: [id number] Date of Birth: [birth year] Referring Provider:   [name] Pulmonary Rehab Walk Test from 03/02/2024 in [facility] for Heart, Vascular, & Lung Health  Referring Provider [name]    Encounter Date: 05/12/2024  Check In:  Session Check In - 05/12/24 1121       Check-In   Supervising physician immediately available to respond to emergencies [facility] MD immediately available    Physician(s) Levin Reamer, NP    Location [facility]    Staff Present [name], MS, ACSM-CEP, Exercise Physiologist;Yanice Maqueda Rochelle Chu, ACSM-CEP, Exercise Physiologist;Casey Carmen Chol, RN, BSN;Samantha Belarus, RD, LDN    Virtual Visit No    Medication changes reported     No    Fall or balance concerns reported    No    Tobacco Cessation No Change    Warm-up and Cool-down Performed as group-led instruction    Resistance Training Performed Yes    VAD Patient? No    PAD/SET Patient? No      Pain Assessment   Currently in Pain? No/denies    Multiple Pain Sites No          Capillary Blood Glucose: No results found for this or any previous visit (from the past 24 hours).    Social History   Tobacco Use  Smoking Status Never  Smokeless Tobacco Never    Goals Met:  Independence with exercise equipment Exercise tolerated well No report of concerns or symptoms today Strength training completed today  Goals Unmet:  Not Applicable  Comments: Service time is from 1010 to 1145.    Dr. [name] is Medical Director for Pulmonary Rehab at [facility].

## 2024-05-14 ENCOUNTER — Encounter (HOSPITAL_COMMUNITY)
Admission: RE | Admit: 2024-05-14 | Discharge: 2024-05-14 | Disposition: A | Discharge status: Home / Self Care | Source: Ambulatory Visit | Attending: Thoracic Surgery | Admitting: Thoracic Surgery

## 2024-05-14 DIAGNOSIS — R0602 Shortness of breath: Secondary | ICD-10-CM | POA: Diagnosis not present

## 2024-05-14 NOTE — Progress Notes (Signed)
 Daily Session Note  Patient Details  Name: Monica White MRN: [id number] Date of Birth: 1941/06/16 Referring Provider:   [name] Pulmonary Rehab Walk Test from 03/02/2024 in [facility] for Heart, Vascular, & Lung Health  Referring Provider [name]    Encounter Date: 05/14/2024  Check In:  Session Check In - 05/14/24 1123       Check-In   Supervising physician immediately available to respond to emergencies [facility] MD immediately available    Physician(s) Lawana Pray, NP    Location [facility]    Staff Present [name], MS, ACSM-CEP, Exercise Physiologist;[name], ACSM-CEP, Exercise Physiologist;Maguire Killmer Carmen Chol, RN, BSN;Samantha Belarus, RD, LDN    Virtual Visit No    Medication changes reported     No    Fall or balance concerns reported    No    Tobacco Cessation No Change    Warm-up and Cool-down Performed as group-led instruction    Resistance Training Performed Yes    VAD Patient? No    PAD/SET Patient? No      Pain Assessment   Currently in Pain? No/denies    Multiple Pain Sites No          Capillary Blood Glucose: No results found for this or any previous visit (from the past 24 hours).    Social History   Tobacco Use  Smoking Status Never  Smokeless Tobacco Never    Goals Met:  Proper associated with RPD/PD & O2 Sat Independence with exercise equipment Exercise tolerated well No report of concerns or symptoms today Strength training completed today  Goals Unmet:  Not Applicable  Comments: Service time is from 1010 to 1142.    Dr. [name] is Medical Director for Pulmonary Rehab at [facility].

## 2024-05-19 ENCOUNTER — Encounter (HOSPITAL_COMMUNITY)
Admission: RE | Admit: 2024-05-19 | Discharge: 2024-05-19 | Disposition: A | Discharge status: Home / Self Care | Source: Ambulatory Visit | Attending: Thoracic Surgery | Admitting: Thoracic Surgery

## 2024-05-19 VITALS — Wt 131.6 lb

## 2024-05-19 DIAGNOSIS — R0602 Shortness of breath: Secondary | ICD-10-CM | POA: Diagnosis not present

## 2024-05-19 NOTE — Progress Notes (Signed)
 Daily Session Note  Patient Details  Name: Monica White MRN: [id number] Date of Birth: 18-Aug-1941 Referring Provider:   Conrad Ports [facility] Rehab Walk Test from 03/02/2024 in [facility]  Referring Provider Murlean Norland    Encounter Date: 05/19/2024  Check In:  Session Check In - 05/19/24 1033       Check-In   Supervising physician immediately available to respond to emergencies [facility] MD immediately available    Physician(s) Josefa Beauvais, NP    Location [facility]    Staff Present Johnnie Moats, MS, ACSM-CEP, Exercise Physiologist;Randi Midge HECKLE, ACSM-CEP, Exercise Physiologist;Casey Claudene Candia Levin, RN, BSN    Virtual Visit No    Medication changes reported     No    Fall or balance concerns reported    No    Tobacco Cessation No Change    Warm-up and Cool-down Performed as group-led instruction    Resistance Training Performed Yes    VAD Patient? No    PAD/SET Patient? No      Pain Assessment   Currently in Pain? No/denies    Multiple Pain Sites No          Capillary Blood Glucose: No results found for this or any previous visit (from the past 24 hours).   Exercise Prescription Changes - 05/19/24 1100       Response to Exercise   Blood Pressure (Admit) 102/64    Blood Pressure (Exercise) 136/70    Blood Pressure (Exit) 102/64    Heart Rate (Admit) 73 bpm    Heart Rate (Exercise) 101 bpm    Heart Rate (Exit) 79 bpm    Oxygen Saturation (Admit) 95 %    Oxygen Saturation (Exercise) 95 %    Rating of Perceived Exertion (Exercise) 13    Perceived Dyspnea (Exercise) 1    Duration Continue with 30 min of aerobic exercise without signs/symptoms of physical distress.    Intensity THRR unchanged      Progression   Progression Continue to progress workloads to maintain intensity without signs/symptoms of physical distress.      Resistance Training   Training Prescription Yes     Weight BLUE BANDS    Reps 10-15    Time 10 Minutes      Recumbant Bike   Level 3    Minutes 15    METs 3.5      NuStep   Level 4    SPM 72    Minutes 15    METs 2.5          Social History   Tobacco Use  Smoking Status Never  Smokeless Tobacco Never    Goals Met:  Proper associated with RPD/PD & O2 Sat Independence with exercise equipment Exercise tolerated well No report of concerns or symptoms today Strength training completed today  Goals Unmet:  Not Applicable  Comments: Service time is from 1005 to 1150.    Dr. Slater Staff is Medical Director for Pulmonary Rehab at [facility].

## 2024-05-21 ENCOUNTER — Encounter (HOSPITAL_COMMUNITY)
Admission: RE | Admit: 2024-05-21 | Discharge: 2024-05-21 | Disposition: A | Discharge status: Home / Self Care | Source: Ambulatory Visit | Attending: Thoracic Surgery | Admitting: Thoracic Surgery

## 2024-05-21 DIAGNOSIS — R0602 Shortness of breath: Secondary | ICD-10-CM

## 2024-05-21 NOTE — Progress Notes (Signed)
 Daily Session Note  Patient Details  Name: Monica White MRN: [id number] Date of Birth: 1941/04/16 Referring Provider:   Conrad Ports Pulmonary Rehab Walk Test from 03/02/2024 in [facility]  Referring Provider Murlean Norland    Encounter Date: 05/21/2024  Check In:  Session Check In - 05/21/24 1030       Check-In   Supervising physician immediately available to respond to emergencies [facility] MD immediately available    Physician(s) Bular Mose, NP    [facility]    Staff Present Johnnie Moats, MS, ACSM-CEP, Exercise Physiologist;Randi Midge HECKLE, ACSM-CEP, Exercise Physiologist;Casey Claudene Candia Levin, RN, BSN    Virtual Visit No    Medication changes reported     No    Fall or balance concerns reported    No    Tobacco Cessation No Change    Warm-up and Cool-down Performed as group-led instruction    Resistance Training Performed Yes    VAD Patient? No    PAD/SET Patient? No      Pain Assessment   Currently in Pain? No/denies    Multiple Pain Sites No          Capillary Blood Glucose: No results found for this or any previous visit (from the past 24 hours).    Social History   Tobacco Use  Smoking Status Never  Smokeless Tobacco Never    Goals Met:  Exercise tolerated well No report of concerns or symptoms today Strength training completed today  Goals Unmet:  Not Applicable  Comments: Service time is from 1014 to 1145    Dr. Slater Staff is Medical Director for Pulmonary Rehab at [facility].

## 2024-05-26 ENCOUNTER — Encounter (HOSPITAL_COMMUNITY)

## 2024-05-28 ENCOUNTER — Telehealth (HOSPITAL_COMMUNITY): Payer: Self-pay | Admitting: Pharmacy Technician

## 2024-05-28 ENCOUNTER — Encounter (HOSPITAL_COMMUNITY)

## 2024-05-28 NOTE — Telephone Encounter (Signed)
 Auth Submission: NO AUTH NEEDED Site of care: Site of care: [facility] Payer: Medicare A/B, BCBS Supp Medication & CPT/J Code(s) submitted: Prolia  (Denosumab ) [id number] Diagnosis Code: M81.0 Route of submission (phone, fax, portal):  Phone # Fax # Auth type: Buy/Bill HB Units/visits requested: 60mg  x 2 doses  Reference number:  Approval from: 05/28/24 to 12/26/24    Auth Submission: NO AUTH NEEDED Site of care: Site of care: [facility] Payer: Medicare A/B, BCBS Supp Medication & CPT/J Code(s) submitted: Leqvio  (Inclisiran) J1306 Diagnosis Code: E78.5 Route of submission (phone, fax, portal):  Phone # Fax # Auth type: Buy/Bill HB Units/visits requested: 284mg  q 6 months Reference number:  Approval from: 05/28/24 to 12/26/24  Dagoberto Armour, CPhT [facility] [phone]

## 2024-06-02 ENCOUNTER — Encounter (HOSPITAL_COMMUNITY)
Admission: RE | Admit: 2024-06-02 | Discharge: 2024-06-02 | Disposition: A | Discharge status: Home / Self Care | Source: Ambulatory Visit | Attending: Thoracic Surgery | Admitting: Thoracic Surgery

## 2024-06-02 VITALS — Wt 131.0 lb

## 2024-06-02 DIAGNOSIS — Z79620 Long term (current) use of immunosuppressive biologic: Secondary | ICD-10-CM | POA: Insufficient documentation

## 2024-06-02 DIAGNOSIS — M810 Age-related osteoporosis without current pathological fracture: Secondary | ICD-10-CM | POA: Diagnosis not present

## 2024-06-02 DIAGNOSIS — R0602 Shortness of breath: Secondary | ICD-10-CM | POA: Insufficient documentation

## 2024-06-02 NOTE — Progress Notes (Signed)
 Daily Session Note  Patient Details  Name: Monica White MRN: [id number] Date of Birth: [birth year] Referring Provider:   Conrad Ports Pulmonary Rehab Walk Test from 03/02/2024 in [facility]  Referring Provider Murlean Norland    Encounter Date: 06/02/2024  Check In:  Session Check In - 06/02/24 1022       Check-In   Supervising physician immediately available to respond to emergencies [facility] MD immediately available    Physician(s) Damien Braver, NP    Location [facility]    Staff Present Johnnie Moats, MS, ACSM-CEP, Exercise Physiologist;Evanthia Maund Claudene Candia Levin, RN, BSN    Virtual Visit No    Medication changes reported     No    Fall or balance concerns reported    No    Tobacco Cessation No Change    Warm-up and Cool-down Performed as group-led instruction    Resistance Training Performed Yes    VAD Patient? No    PAD/SET Patient? No      Pain Assessment   Currently in Pain? No/denies    Multiple Pain Sites No          Capillary Blood Glucose: No results found for this or any previous visit (from the past 24 hours).   Exercise Prescription Changes - 06/02/24 1200       Response to Exercise   Blood Pressure (Admit) 112/72    Blood Pressure (Exercise) 140/78    Blood Pressure (Exit) 104/64    Heart Rate (Admit) 74 bpm    Heart Rate (Exercise) 98 bpm    Heart Rate (Exit) 76 bpm    Oxygen Saturation (Admit) 96 %    Oxygen Saturation (Exercise) 97 %    Oxygen Saturation (Exit) 94 %    Rating of Perceived Exertion (Exercise) 11    Perceived Dyspnea (Exercise) 1    Duration Continue with 30 min of aerobic exercise without signs/symptoms of physical distress.    Intensity THRR unchanged      Progression   Progression Continue to progress workloads to maintain intensity without signs/symptoms of physical distress.      Resistance Training   Training Prescription Yes    Weight BLUE BANDS     Reps 10-15    Time 10 Minutes      Recumbant Bike   Level 3    Minutes 15    METs 3.4      NuStep   Level 4    SPM 68    Minutes 15    METs 2.5          Social History   Tobacco Use  Smoking Status Never  Smokeless Tobacco Never    Goals Met:  Proper associated with RPD/PD & O2 Sat Independence with exercise equipment Exercise tolerated well No report of concerns or symptoms today Strength training completed today  Goals Unmet:  Not Applicable  Comments: Service time is from 1005 to 1132.    Dr. Slater Staff is Medical Director for Pulmonary Rehab at [facility].

## 2024-06-03 NOTE — Progress Notes (Signed)
 Pulmonary Individual Treatment Plan  Patient Details  Name: Monica White MRN: [id number] Date of Birth: 1941-07-09 Referring Provider:   Conrad Ports Pulmonary Rehab Walk Test from 03/02/2024 in [facility]  Referring Provider Klapper  [Ellison]    Initial Encounter Date:  Flowsheet Row Pulmonary Rehab Walk Test from 03/02/2024 in [facility] for Heart, Vascular, & Lung [facility]  Date 03/02/24    Visit Diagnosis: Shortness of breath  Patient's Home Medications on Admission:   Current Outpatient Medications:    acetaminophen  (TYLENOL ) 325 MG tablet, Take 2 tablets (650 mg total) by mouth every 6 (six) hours as needed. (Patient taking differently: Take 650 mg by mouth every 6 (six) hours as needed for mild pain (pain score 1-3) or headache.), Disp: , Rfl:    Ascorbic Acid (VITAMIN C PO), Take 1 tablet by mouth daily., Disp: , Rfl:    cholecalciferol (VITAMIN D3) 25 MCG (1000 UNIT) tablet, Take 1,000 Units by mouth daily., Disp: , Rfl:    Cyanocobalamin  (B-12) 1000 MCG CAPS, Take 1,000 mcg by mouth daily., Disp: , Rfl:    desvenlafaxine (PRISTIQ) 25 MG 24 hr tablet, Take 25 mg by mouth in the morning., Disp: , Rfl:    estradiol  (ESTRACE ) 0.5 MG tablet, Take 0.5 mg by mouth daily., Disp: , Rfl:    levothyroxine  (SYNTHROID ) 75 MCG tablet, Take 75 mcg by mouth daily before breakfast., Disp: , Rfl:    medroxyPROGESTERone  (PROVERA ) 2.5 MG tablet, Take 1 tablet (2.5 mg total) by mouth daily., Disp: 90 tablet, Rfl: 3   Multiple Vitamins-Minerals (MULTIVITAMIN PO), Take 1 tablet by mouth daily with breakfast., Disp: , Rfl:    ondansetron  (ZOFRAN -ODT) 4 MG disintegrating tablet, Take 1 tablet (4 mg total) by mouth every 6 (six) hours as needed for nausea. (Patient not taking: Reported on 08/28/2023), Disp: 30 tablet, Rfl: 3   oxyCODONE  (OXY IR/ROXICODONE ) 5 MG immediate release tablet, Take 1 tablet (5 mg total) by mouth  every 6 (six) hours as needed for breakthrough pain. (Patient not taking: Reported on 08/28/2023), Disp: 15 tablet, Rfl: 0   pantoprazole  (PROTONIX ) 40 MG tablet, Take 1 tablet (40 mg total) by mouth 2 (two) times daily. (Patient not taking: Reported on 08/28/2023), Disp: 60 tablet, Rfl: 0   Probiotic Product (PROBIOTIC PO), Take 1 capsule by mouth in the morning., Disp: , Rfl:   Past Medical History: Past Medical History:  Diagnosis Date   Anemia    Arthritis    Dyspnea    GERD (gastroesophageal reflux disease)    History of hiatal hernia    Hypercholesteremia    Hypothyroid 2015   PONV (postoperative nausea and vomiting)     Tobacco Use: Social History   Tobacco Use  Smoking Status Never  Smokeless Tobacco Never    Labs: Review Flowsheet  More data exists      Latest Ref Rng & Units 03/26/2018 01/06/2021 12/01/2021 02/14/2023 02/18/2023  Labs for ITP Cardiac and Pulmonary Rehab  Cholestrol 100 - 199 mg/dL 830  795  - - -  LDL (calc) 0 - 99 mg/dL 90  870  - - -  HDL-C >60 mg/dL 64  57  - - -  Trlycerides <150 mg/dL 73  897  - 894  878   PH, Arterial 7.350 - 7.450 - - 7.376  - -  PCO2 arterial 32.0 - 48.0 mmHg - - 35.7  - -  Bicarbonate 20.0 -  28.0 mmol/L - - 20.9  21.6  - -  TCO2 22 - 32 mmol/L - - 22  23  - -  Acid-base deficit 0.0 - 2.0 mmol/L - - 4.0  4.0  - -  O2 Saturation % - - 97.0  70.0  - -    Details       Multiple values from one day are sorted in reverse-chronological order         Capillary Blood Glucose: Lab Results  Component Value Date   GLUCAP 117 (H) 02/16/2023   GLUCAP 166 (H) 02/16/2023   GLUCAP 123 (H) 02/16/2023   GLUCAP 138 (H) 02/15/2023   GLUCAP 146 (H) 02/14/2023     Pulmonary Assessment Scores:  Pulmonary Assessment Scores     Row Name 03/02/24 1013         ADL UCSD   ADL Phase Entry     SOB Score total 63       CAT Score   CAT Score 13       mMRC Score   mMRC Score 4       UCSD: Self-administered rating of dyspnea  associated with activities of daily living (ADLs) 6-point scale (0 = not at all to 5 = maximal or unable to do because of breathlessness)  Scoring Scores range from 0 to 120.  Minimally important difference is 5 units  CAT: CAT can identify the health impairment of COPD patients and is better correlated with disease progression.  CAT has a scoring range of zero to 40. The CAT score is classified into four groups of low (less than 10), medium (10 - 20), high (21-30) and very high (31-40) based on the impact level of disease on health status. A CAT score over 10 suggests significant symptoms.  A worsening CAT score could be explained by an exacerbation, poor medication adherence, poor inhaler technique, or progression of COPD or comorbid conditions.  CAT MCID is 2 points  mMRC: mMRC (Modified Medical Research Council) Dyspnea Scale is used to assess the degree of baseline functional disability in patients of respiratory disease due to dyspnea. No minimal important difference is established. A decrease in score of 1 point or greater is considered a positive change.   Pulmonary Function Assessment:  Pulmonary Function Assessment - 03/02/24 1043       Breath   Bilateral Breath Sounds Clear          Exercise Target Goals: Exercise Program Goal: Individual exercise prescription set using results from initial 6 min walk test and THRR while considering  patient's activity barriers and safety.   Exercise Prescription Goal: Initial exercise prescription builds to 30-45 minutes a day of aerobic activity, 2-3 days per week.  Home exercise guidelines will be given to patient during program as part of exercise prescription that the participant will acknowledge.  Activity Barriers & Risk Stratification:  Activity Barriers & Cardiac Risk Stratification - 03/02/24 0926       Activity Barriers & Cardiac Risk Stratification   Activity Barriers Deconditioning;Muscular Weakness;Shortness of Breath    No bending, twisting, or lifting   Cardiac Risk Stratification Moderate          6 Minute Walk:  6 Minute Walk     Row Name 03/02/24 1038         6 Minute Walk   Phase Initial     Distance 680 feet     Walk Time 6 minutes     # of Rest Breaks  1  2:15-3:30     MPH 1.29     METS 1.4     RPE 13     Perceived Dyspnea  1     VO2 Peak 4.88     Symptoms No     Resting HR 82 bpm     Resting BP 106/70     Resting Oxygen Saturation  98 %     Exercise Oxygen Saturation  during 6 min walk 93 %     Max Ex. HR 125 bpm     Max Ex. BP 104/70     2 Minute Post BP 108/70       Interval HR   1 Minute HR 108     2 Minute HR 109     3 Minute HR 110     4 Minute HR 114     5 Minute HR 123     6 Minute HR 125     2 Minute Post HR 103     Interval Heart Rate? Yes       Interval Oxygen   Interval Oxygen? Yes     Baseline Oxygen Saturation % 98 %     1 Minute Oxygen Saturation % 97 %     1 Minute Liters of Oxygen 0 L     2 Minute Oxygen Saturation % 93 %     2 Minute Liters of Oxygen 0 L     3 Minute Oxygen Saturation % 98 %     3 Minute Liters of Oxygen 0 L     4 Minute Oxygen Saturation % 99 %     4 Minute Liters of Oxygen 0 L     5 Minute Oxygen Saturation % 98 %     5 Minute Liters of Oxygen 0 L     6 Minute Oxygen Saturation % 96 %     6 Minute Liters of Oxygen 0 L     2 Minute Post Oxygen Saturation % 96 %     2 Minute Post Liters of Oxygen 0 L        Oxygen Initial Assessment:  Oxygen Initial Assessment - 03/02/24 0928       Home Oxygen   Home Oxygen Device None    Sleep Oxygen Prescription None    Home Exercise Oxygen Prescription None    Home Resting Oxygen Prescription None      Initial 6 min Walk   Oxygen Used None      Program Oxygen Prescription   Program Oxygen Prescription None      Intervention   Short Term Goals To learn and understand importance of maintaining oxygen saturations>88%;To learn and understand importance of monitoring SPO2 with  pulse oximeter and demonstrate accurate use of the pulse oximeter.;To learn and demonstrate proper pursed lip breathing techniques or other breathing techniques.     Long  Term Goals Exhibits proper breathing techniques, such as pursed lip breathing or other method taught during program session;Verbalizes importance of monitoring SPO2 with pulse oximeter and return demonstration;Maintenance of O2 saturations>88%          Oxygen Re-Evaluation:  Oxygen Re-Evaluation     Row Name 03/04/24 1110 04/29/24 0945 06/01/24 0940         Program Oxygen Prescription   Program Oxygen Prescription None None None       Home Oxygen   Home Oxygen Device None None None     Sleep Oxygen Prescription None None None  Home Exercise Oxygen Prescription None None None     Home Resting Oxygen Prescription None None None       Goals/Expected Outcomes   Short Term Goals To learn and understand importance of maintaining oxygen saturations>88%;To learn and understand importance of monitoring SPO2 with pulse oximeter and demonstrate accurate use of the pulse oximeter.;To learn and demonstrate proper pursed lip breathing techniques or other breathing techniques.  To learn and understand importance of maintaining oxygen saturations>88%;To learn and understand importance of monitoring SPO2 with pulse oximeter and demonstrate accurate use of the pulse oximeter.;To learn and demonstrate proper pursed lip breathing techniques or other breathing techniques.  To learn and understand importance of maintaining oxygen saturations>88%;To learn and understand importance of monitoring SPO2 with pulse oximeter and demonstrate accurate use of the pulse oximeter.;To learn and demonstrate proper pursed lip breathing techniques or other breathing techniques.      Long  Term Goals Exhibits proper breathing techniques, such as pursed lip breathing or other method taught during program session;Verbalizes importance of monitoring SPO2 with  pulse oximeter and return demonstration;Maintenance of O2 saturations>88% Exhibits proper breathing techniques, such as pursed lip breathing or other method taught during program session;Verbalizes importance of monitoring SPO2 with pulse oximeter and return demonstration;Maintenance of O2 saturations>88% Exhibits proper breathing techniques, such as pursed lip breathing or other method taught during program session;Verbalizes importance of monitoring SPO2 with pulse oximeter and return demonstration;Maintenance of O2 saturations>88%     Goals/Expected Outcomes Compliance and understanding of oxygen saturation and breathing techniques to decrease shortness of breath. Compliance and understanding of oxygen saturation and breathing techniques to decrease shortness of breath. Compliance and understanding of oxygen saturation and breathing techniques to decrease shortness of breath.        Oxygen Discharge (Final Oxygen Re-Evaluation):  Oxygen Re-Evaluation - 06/01/24 0940       Program Oxygen Prescription   Program Oxygen Prescription None      Home Oxygen   Home Oxygen Device None    Sleep Oxygen Prescription None    Home Exercise Oxygen Prescription None    Home Resting Oxygen Prescription None      Goals/Expected Outcomes   Short Term Goals To learn and understand importance of maintaining oxygen saturations>88%;To learn and understand importance of monitoring SPO2 with pulse oximeter and demonstrate accurate use of the pulse oximeter.;To learn and demonstrate proper pursed lip breathing techniques or other breathing techniques.     Long  Term Goals Exhibits proper breathing techniques, such as pursed lip breathing or other method taught during program session;Verbalizes importance of monitoring SPO2 with pulse oximeter and return demonstration;Maintenance of O2 saturations>88%    Goals/Expected Outcomes Compliance and understanding of oxygen saturation and breathing techniques to decrease  shortness of breath.          Initial Exercise Prescription:  Initial Exercise Prescription - 03/02/24 1000       Date of Initial Exercise RX and Referring Provider   Date 03/02/24    Referring Provider Murlean Staff   Expected Discharge Date 05/28/24      Recumbant Bike   Level 1    RPM 20    Watts 15    Minutes 15    METs 1.9      NuStep   Level 1    SPM 72    Minutes 15    METs 1.5      Prescription Details   Frequency (times per week) 2    Duration Progress to  30 minutes of continuous aerobic without signs/symptoms of physical distress      Intensity   THRR 40-80% of Max Heartrate 55-110    Ratings of Perceived Exertion 11-13    Perceived Dyspnea 0-4      Progression   Progression Continue progressive overload as per policy without signs/symptoms or physical distress.      Resistance Training   Training Prescription Yes    Weight red bands    Reps 10-15          Perform Capillary Blood Glucose checks as needed.  Exercise Prescription Changes:   Exercise Prescription Changes     Row Name 03/10/24 1100 03/19/24 0942 04/07/24 1100 04/21/24 1200 05/05/24 1100     Response to Exercise   Blood Pressure (Admit) 123/83 121/75 90/58 92/68  117/70   Blood Pressure (Exercise) 130/80 -- 124/70 122/80 122/66   Blood Pressure (Exit) 108/70 110/68 96/62 110/60 104/60   Heart Rate (Admit) 88 bpm 86 bpm 75 bpm 72 bpm 74 bpm   Heart Rate (Exercise) 87 bpm 107 bpm 92 bpm 102 bpm 111 bpm   Heart Rate (Exit) 88 bpm 84 bpm 81 bpm 72 bpm 80 bpm   Oxygen Saturation (Admit) 98 % 98 % 98 % 96 % 97 %   Oxygen Saturation (Exercise) 96 % 93 % 97 % 96 % 96 %   Oxygen Saturation (Exit) 98 % 98 % 94 % 98 % 95 %   Rating of Perceived Exertion (Exercise) 9 11 9 13 13    Perceived Dyspnea (Exercise) 0 1 1 1 2    Duration Progress to 30 minutes of  aerobic without signs/symptoms of physical distress Continue with 30 min of aerobic exercise without signs/symptoms of physical  distress. Continue with 30 min of aerobic exercise without signs/symptoms of physical distress. Continue with 30 min of aerobic exercise without signs/symptoms of physical distress. Continue with 30 min of aerobic exercise without signs/symptoms of physical distress.   Intensity THRR unchanged THRR unchanged THRR unchanged THRR unchanged THRR unchanged     Progression   Progression Continue to progress workloads to maintain intensity without signs/symptoms of physical distress. Continue to progress workloads to maintain intensity without signs/symptoms of physical distress. Continue to progress workloads to maintain intensity without signs/symptoms of physical distress. Continue to progress workloads to maintain intensity without signs/symptoms of physical distress. Continue to progress workloads to maintain intensity without signs/symptoms of physical distress.     Resistance Training   Training Prescription Yes Yes Yes Yes Yes   Weight red bands red bands red bands red bands BLUE BANDS   Reps 10-15 10-15 10-15 10-15 10-15   Time 10 Minutes 10 Minutes 10 Minutes 10 Minutes 10 Minutes     Recumbant Bike   Level 1 3 3 3 3    RPM -- 54 -- 66 57   Watts -- -- -- 43 35   Minutes 15 15 15 15 15    METs 1.9 3.1 3 3.7 3.5     NuStep   Level 1 2 3 3 3    SPM 73 75 -- 82 68   Minutes 15 15 15 15 15    METs 2.1 2.3 2.3 2.6 1.8    Row Name 05/19/24 1100 06/02/24 1200           Response to Exercise   Blood Pressure (Admit) 102/64 112/72      Blood Pressure (Exercise) 136/70 140/78      Blood Pressure (Exit) 102/64 104/64  Heart Rate (Admit) 73 bpm 74 bpm      Heart Rate (Exercise) 101 bpm 98 bpm      Heart Rate (Exit) 79 bpm 76 bpm      Oxygen Saturation (Admit) 95 % 96 %      Oxygen Saturation (Exercise) 95 % 97 %      Oxygen Saturation (Exit) -- 94 %      Rating of Perceived Exertion (Exercise) 13 11      Perceived Dyspnea (Exercise) 1 1      Duration Continue with 30 min of aerobic  exercise without signs/symptoms of physical distress. Continue with 30 min of aerobic exercise without signs/symptoms of physical distress.      Intensity THRR unchanged THRR unchanged        Progression   Progression Continue to progress workloads to maintain intensity without signs/symptoms of physical distress. Continue to progress workloads to maintain intensity without signs/symptoms of physical distress.        Resistance Training   Training Prescription Yes Yes      Weight BLUE BANDS BLUE BANDS      Reps 10-15 10-15      Time 10 Minutes 10 Minutes        Recumbant Bike   Level 3 3      Minutes 15 15      METs 3.5 3.4        NuStep   Level 4 4      SPM 72 68      Minutes 15 15      METs 2.5 2.5         Exercise Comments:   Exercise Comments     Row Name 03/10/24 1156 04/14/24 1545         Exercise Comments Pt completed first day of exercise. She exercised for 15 min on the recumbent bike and Nustep. Jocelin averaged 1.9 METs at level 1 on the recumbent bike and 2.1 METs at level 1 on the Nustep. She performed the warmup and coodown standing without limitations. Discussed METs. Completed home exercise plan. Zeya is currently exercising at home. She walks 3-5 days/wk for 2x15 min/day. She does have access to a local fitness center. I agreed with Caylyn's current home exericse plan and encouraged her to exercise inside if weather is ideal. She agreed with my recommnendations. I mentioned exercising on machines similar to ones at our facility. Oneta voiced understanding.         Exercise Goals and Review:   Exercise Goals     Row Name 03/02/24 0927             Exercise Goals   Increase Physical Activity Yes       Intervention Develop an individualized exercise prescription for aerobic and resistive training based on initial evaluation findings, risk stratification, comorbidities and participant's personal goals.;Provide advice, education, support and  counseling about physical activity/exercise needs.       Expected Outcomes Short Term: Attend rehab on a regular basis to increase amount of physical activity.;Long Term: Exercising regularly at least 3-5 days a week.;Long Term: Add in home exercise to make exercise part of routine and to increase amount of physical activity.       Increase Strength and Stamina Yes       Intervention Provide advice, education, support and counseling about physical activity/exercise needs.;Develop an individualized exercise prescription for aerobic and resistive training based on initial evaluation findings, risk stratification, comorbidities and participant's personal goals.  Expected Outcomes Short Term: Increase workloads from initial exercise prescription for resistance, speed, and METs.;Short Term: Perform resistance training exercises routinely during rehab and add in resistance training at home;Long Term: Improve cardiorespiratory fitness, muscular endurance and strength as measured by increased METs and functional capacity ( )       Able to understand and use rate of perceived exertion (RPE) scale Yes       Intervention Provide education and explanation on how to use RPE scale       Expected Outcomes Short Term: Able to use RPE daily in rehab to express subjective intensity level;Long Term:  Able to use RPE to guide intensity level when exercising independently       Able to understand and use Dyspnea scale Yes       Intervention Provide education and explanation on how to use Dyspnea scale       Expected Outcomes Short Term: Able to use Dyspnea scale daily in rehab to express subjective sense of shortness of breath during exertion;Long Term: Able to use Dyspnea scale to guide intensity level when exercising independently       Knowledge and understanding of Target Heart Rate Range (THRR) Yes       Intervention Provide education and explanation of THRR including how the numbers were predicted and where  they are located for reference       Expected Outcomes Short Term: Able to state/look up THRR;Short Term: Able to use daily as guideline for intensity in rehab;Long Term: Able to use THRR to govern intensity when exercising independently       Understanding of Exercise Prescription Yes       Intervention Provide education, explanation, and written materials on patient's individual exercise prescription       Expected Outcomes Short Term: Able to explain program exercise prescription;Long Term: Able to explain home exercise prescription to exercise independently          Exercise Goals Re-Evaluation :  Exercise Goals Re-Evaluation     Row Name 03/04/24 1108 04/03/24 0910 04/29/24 0943 06/01/24 0938       Exercise Goal Re-Evaluation   Exercise Goals Review Increase Physical Activity;Able to understand and use Dyspnea scale;Understanding of Exercise Prescription;Increase Strength and Stamina;Knowledge and understanding of Target Heart Rate Range (THRR);Able to understand and use rate of perceived exertion (RPE) scale Increase Physical Activity;Able to understand and use Dyspnea scale;Understanding of Exercise Prescription;Increase Strength and Stamina;Knowledge and understanding of Target Heart Rate Range (THRR);Able to understand and use rate of perceived exertion (RPE) scale Increase Physical Activity;Able to understand and use Dyspnea scale;Understanding of Exercise Prescription;Increase Strength and Stamina;Knowledge and understanding of Target Heart Rate Range (THRR);Able to understand and use rate of perceived exertion (RPE) scale Increase Physical Activity;Able to understand and use Dyspnea scale;Understanding of Exercise Prescription;Increase Strength and Stamina;Knowledge and understanding of Target Heart Rate Range (THRR);Able to understand and use rate of perceived exertion (RPE) scale    Comments Tate is scheduled to begin exercise on 4/15. Will continue to monitor and progress as able.  Treesa has completed 7 exercise sessions. She exercises for 15 min on the recumbent bike and Nustep. She averages 3.4 METs at level 3 on the recumbent elliptical and 2.2 METs at level 3 on the Nustep. Nashonda performs the warmup and cooldown standing without limitations. Tresea has increased her level for both exercise modes several times. She tolerates progressions well. Will continue to monitor and progress as able. Kaithlyn has completed 13 exercise sessions. She exercises for  15 min on the recumbent bike and Nustep. She averages 3.1 METs at level 3 on the recumbent bike and 2.1 METs at level 3 on the Nustep. Myah performs the warmup and cooldown standing without limitations. Gema has been difficult to progress recently due to her taking time off for vacations. We have recently discussed home exercise as Serina is currently exercising at home. Will continue to monitor and progress as able. Lasasha has completed 20 exercise sessions. She exercises for 15 min on the recumbent bike and Nustep. She averages 3.6 METs at level 3 on the recumbent bike and 2.8 METs at level 5 on the Nustep. Cami performs the warmup and cooldown standing without limitations. Reathel has increased her level on the Nustep as METs have increased slightly. Her METs have also increased on the recumbent bike despite no change in level. Cheyla has missed a couple of exercise sessions due to a vacation. Will continue to monitor and progress as able.    Expected Outcomes Through exercise at rehab and home, the patient will decrease shortness of breath and feel confident in carrying out an exercise regimen at home. Through exercise at rehab and home, the patient will decrease shortness of breath and feel confident in carrying out an exercise regimen at home. Through exercise at rehab and home, the patient will decrease shortness of breath and feel confident in carrying out an exercise regimen at home. Through exercise at rehab and home,  the patient will decrease shortness of breath and feel confident in carrying out an exercise regimen at home.       Discharge Exercise Prescription (Final Exercise Prescription Changes):  Exercise Prescription Changes - 06/02/24 1200       Response to Exercise   Blood Pressure (Admit) 112/72    Blood Pressure (Exercise) 140/78    Blood Pressure (Exit) 104/64    Heart Rate (Admit) 74 bpm    Heart Rate (Exercise) 98 bpm    Heart Rate (Exit) 76 bpm    Oxygen Saturation (Admit) 96 %    Oxygen Saturation (Exercise) 97 %    Oxygen Saturation (Exit) 94 %    Rating of Perceived Exertion (Exercise) 11    Perceived Dyspnea (Exercise) 1    Duration Continue with 30 min of aerobic exercise without signs/symptoms of physical distress.    Intensity THRR unchanged      Progression   Progression Continue to progress workloads to maintain intensity without signs/symptoms of physical distress.      Resistance Training   Training Prescription Yes    Weight BLUE BANDS    Reps 10-15    Time 10 Minutes      Recumbant Bike   Level 3    Minutes 15    METs 3.4      NuStep   Level 4    SPM 68    Minutes 15    METs 2.5          Nutrition:  Target Goals: Understanding of nutrition guidelines, daily intake of sodium 1500mg , cholesterol 200mg , calories 30% from fat and 7% or less from saturated fats, daily to have 5 or more servings of fruits and vegetables.  Biometrics:  Pre Biometrics - 03/02/24 0916       Pre Biometrics   Grip Strength 15 kg           Nutrition Therapy Plan and Nutrition Goals:  Nutrition Therapy & Goals - 05/12/24 1125       Nutrition Therapy  Diet General Healthy Diet      Personal Nutrition Goals   Nutrition Goal Patient to improve diet quality by using the plate method as a guide for meal planning to include lean protein/plant protein, fruits, vegetables, whole grains, nonfat dairy as part of a well-balanced diet.   goal in progress.   Personal Goal  #2 Patient to identify strategies for weight gain of 0.5-2.0# per week.   goal in progress.   Comments Goals in progress. Gricel has medical history of shortness of breath, mulitple hiatal hernia repair, gtube removal 01/2023. She did recently have another hernia surgery in 12/2023 and was discharged to SNF on a full liquid diet. At this time, she has advanced to a regular diet. She is motivated to gain weight and she is up 5.9# since starting with our program. We have discussed multiple strategies for weight gain including eating frequency, nutrition supplements,  protein drinks, etc. She typically drinks one protein drink daily. Patient will continue to benefit from participation in pulmonary rehab for nutrition, exercise, and lifestyle modification support.      Intervention Plan   Intervention Prescribe, educate and counsel regarding individualized specific dietary modifications aiming towards targeted core components such as weight, hypertension, lipid management, diabetes, heart failure and other comorbidities.;Nutrition handout(s) given to patient.    Expected Outcomes Short Term Goal: Understand basic principles of dietary content, such as calories, fat, sodium, cholesterol and nutrients.;Long Term Goal: Adherence to prescribed nutrition plan.          Nutrition Assessments:  MEDIFICTS Score Key: >=70 Need to make dietary changes  40-70 Heart Healthy Diet <= 40 Therapeutic Level Cholesterol Diet   Picture Your Plate Scores: <59 Unhealthy dietary pattern with much room for improvement. 41-50 Dietary pattern unlikely to meet recommendations for good health and room for improvement. 51-60 More healthful dietary pattern, with some room for improvement.  >60 Healthy dietary pattern, although there may be some specific behaviors that could be improved.    Nutrition Goals Re-Evaluation:  Nutrition Goals Re-Evaluation     Row Name 03/10/24 1201 04/09/24 1110 05/12/24 1125          Goals   Current Weight 127 lb 13.9 oz (58 kg) 132 lb 0.9 oz (59.9 kg) 133 lb 13.1 oz (60.7 kg)     Expected Outcome Ladashia has medical history of shortness of breath, mulitple hiatal hernia repair, gtube removal 01/2023. She did recently have another hernia surgery in 12/2023 and was discharged to SNF on a full liquid diet. At this time, she has advanced to a regular diet. Will continue to montior weight. She is motivated to gain weight; will continue to discuss strategies for weight gain. Patient will continue to benefit from participation in pulmonary rehab for nutrition, exercise, and lifestyle modification support. Goals in progress. Narelle has medical history of shortness of breath, mulitple hiatal hernia repair, gtube removal 01/2023. She did recently have another hernia surgery in 12/2023 and was discharged to SNF on a full liquid diet. At this time, she has advanced to a regular diet. She is motivated to gain weight and she is up 4.2# since starting with our program. We have discussed multiple strategies for weight gain including eating frequency, nutrition supplements, etc. She typically drinks one protein drink daily. Patient will continue to benefit from participation in pulmonary rehab for nutrition, exercise, and lifestyle modification support. Goals in progress. Abbigael has medical history of shortness of breath, mulitple hiatal hernia repair, gtube removal 01/2023. She did  recently have another hernia surgery in 12/2023 and was discharged to SNF on a full liquid diet. At this time, she has advanced to a regular diet. She is motivated to gain weight and she is up 5.9# since starting with our program. We have discussed multiple strategies for weight gain including eating frequency, nutrition supplements, protein drinks, etc. She typically drinks one protein drink daily. Patient will continue to benefit from participation in pulmonary rehab for nutrition, exercise, and lifestyle modification support.         Nutrition Goals Discharge (Final Nutrition Goals Re-Evaluation):  Nutrition Goals Re-Evaluation - 05/12/24 1125       Goals   Current Weight 133 lb 13.1 oz (60.7 kg)    Expected Outcome Goals in progress. Rashad has medical history of shortness of breath, mulitple hiatal hernia repair, gtube removal 01/2023. She did recently have another hernia surgery in 12/2023 and was discharged to SNF on a full liquid diet. At this time, she has advanced to a regular diet. She is motivated to gain weight and she is up 5.9# since starting with our program. We have discussed multiple strategies for weight gain including eating frequency, nutrition supplements, protein drinks, etc. She typically drinks one protein drink daily. Patient will continue to benefit from participation in pulmonary rehab for nutrition, exercise, and lifestyle modification support.          Psychosocial: Target Goals: Acknowledge presence or absence of significant depression and/or stress, maximize coping skills, provide positive support system. Participant is able to verbalize types and ability to use techniques and skills needed for reducing stress and depression.  Initial Review & Psychosocial Screening:  Initial Psych Review & Screening - 03/02/24 0930       Initial Review   Current issues with Current Sleep Concerns      Family Dynamics   Good Support System? Yes      Barriers   Psychosocial barriers to participate in program The patient should benefit from training in stress management and relaxation.      Screening Interventions   Interventions Encouraged to exercise;Provide feedback about the scores to participant    Expected Outcomes Short Term goal: Utilizing psychosocial counselor, staff and physician to assist with identification of specific Stressors or current issues interfering with healing process. Setting desired goal for each stressor or current issue identified.;Long Term Goal: Stressors or current  issues are controlled or eliminated.;Short Term goal: Identification and review with participant of any Quality of Life or Depression concerns found by scoring the questionnaire.;Long Term goal: The participant improves quality of Life and PHQ9 Scores as seen by post scores and/or verbalization of changes          Quality of Life Scores:  Scores of 19 and below usually indicate a poorer quality of life in these areas.  A difference of  2-3 points is a clinically meaningful difference.  A difference of 2-3 points in the total score of the Quality of Life Index has been associated with significant improvement in overall quality of life, self-image, physical symptoms, and general health in studies assessing change in quality of life.  PHQ-9: Review Flowsheet       03/02/2024  Depression screen PHQ 2/9  Decreased Interest 0  Down, Depressed, Hopeless 0  PHQ - 2 Score 0  Altered sleeping 2  Tired, decreased energy 1  Change in appetite 1  Feeling bad or failure about yourself  0  Trouble concentrating 1  Moving slowly or fidgety/restless 0  Suicidal thoughts 0  PHQ-9 Score 5  Difficult doing work/chores Somewhat difficult   Interpretation of Total Score  Total Score Depression Severity:  1-4 = Minimal depression, 5-9 = Mild depression, 10-14 = Moderate depression, 15-19 = Moderately severe depression, 20-27 = Severe depression   Psychosocial Evaluation and Intervention:  Psychosocial Evaluation - 03/02/24 1039       Psychosocial Evaluation & Interventions   Interventions Encouraged to exercise with the program and follow exercise prescription;Relaxation education    Comments Mallary is currently having issues with her sleep. She has trouble falling and staying asleep. Erendida does have a history of depression, but states she feels it is controlled with meds at this time. She denied needing a referral to a therapist.  Staff will provide Kaylei with relaxation techniques to help with  her sleep.    Expected Outcomes For Dejia to participate in PR free from any psychosocial barriers or concerns    Continue Psychosocial Services  No Follow up required          Psychosocial Re-Evaluation:  Psychosocial Re-Evaluation     Row Name 03/02/24 1441 04/01/24 1121 04/28/24 1535 05/21/24 1519       Psychosocial Re-Evaluation   Current issues with History of Depression;Current Psychotropic Meds;Current Sleep Concerns History of Depression;Current Psychotropic Meds;Current Sleep Concerns History of Depression;Current Psychotropic Meds Current Psychotropic Meds;None Identified    Comments Maitland is scheduled to start PR on 03/10/24. No new concerns since orientation on 03/02/24. Madia denies any new psychosocial barriers or concerns at this time. She feels like her sleep pattern is a little better since starting PR class. She is compliant with taking her psychotropic meds. Alicea continues to deny any psychosocial barriers or concerns at this time. She is still compliant with taking her psychotropic meds. Mava continues to deny any psychosocial barriers or concerns at this time. She is still compliant with taking her psychotropic meds.    Expected Outcomes For pt to participate in PR free of any psychosocial barriers or concerns For pt to participate in PR free of any psychosocial barriers or concerns For pt to participate in PR free of any psychosocial barriers or concerns For pt to participate in PR free of any psychosocial barriers or concerns    Interventions Encouraged to attend Pulmonary Rehabilitation for the exercise Encouraged to attend Pulmonary Rehabilitation for the exercise Encouraged to attend Pulmonary Rehabilitation for the exercise Encouraged to attend Pulmonary Rehabilitation for the exercise    Continue Psychosocial Services  No Follow up required No Follow up required No Follow up required No Follow up required       Psychosocial Discharge (Final Psychosocial  Re-Evaluation):  Psychosocial Re-Evaluation - 05/21/24 1519       Psychosocial Re-Evaluation   Current issues with Current Psychotropic Meds;None Identified    Comments Kadee continues to deny any psychosocial barriers or concerns at this time. She is still compliant with taking her psychotropic meds.    Expected Outcomes For pt to participate in PR free of any psychosocial barriers or concerns    Interventions Encouraged to attend Pulmonary Rehabilitation for the exercise    Continue Psychosocial Services  No Follow up required          Education: Education Goals: Education classes will be provided on a weekly basis, covering required topics. Participant will state understanding/return demonstration of topics presented.  Learning Barriers/Preferences:  Learning Barriers/Preferences - 03/02/24 0934       Learning Barriers/Preferences   Learning Barriers Exercise  Concerns    Learning Preferences None          Education Topics: Know Your Numbers Group instruction that is supported by a PowerPoint presentation. Instructor discusses importance of knowing and understanding resting, exercise, and post-exercise oxygen saturation, heart rate, and blood pressure. Oxygen saturation, heart rate, blood pressure, rating of perceived exertion, and dyspnea are reviewed along with a normal range for these values.  Flowsheet Row PULMONARY REHAB OTHER RESPIRATORY from 05/21/2024 in [facility], Vascular, & Lung Health  Date 05/21/24  Educator EP  Instruction Review Code 1- Verbalizes Understanding    Exercise for the Pulmonary Patient Group instruction that is supported by a PowerPoint presentation. Instructor discusses benefits of exercise, core components of exercise, frequency, duration, and intensity of an exercise routine, importance of utilizing pulse oximetry during exercise, safety while exercising, and options of places to exercise outside of rehab.   Flowsheet Row PULMONARY REHAB OTHER RESPIRATORY from 05/14/2024 in [facility] for Heart, Vascular, & Lung Health  Date 05/14/24  Educator EP  Instruction Review Code 1- Verbalizes Understanding    MET Level  Group instruction provided by PowerPoint, verbal discussion, and written material to support subject matter. Instructor reviews what METs are and how to increase METs.  Flowsheet Row PULMONARY REHAB OTHER RESPIRATORY from 04/16/2024 in [facility] for Heart, Vascular, & Lung Health  Date 04/16/24  Educator EP  Instruction Review Code 1- Verbalizes Understanding    Pulmonary Medications Verbally interactive group education provided by instructor with focus on inhaled medications and proper administration. Flowsheet Row PULMONARY REHAB OTHER RESPIRATORY from 05/07/2024 in [facility] for Heart, Vascular, & Lung Health  Date 05/07/24  Educator RT  Instruction Review Code 1- Verbalizes Understanding    Anatomy and Physiology of the Respiratory System Group instruction provided by PowerPoint, verbal discussion, and written material to support subject matter. Instructor reviews respiratory cycle and anatomical components of the respiratory system and their functions. Instructor also reviews differences in obstructive and restrictive respiratory diseases with examples of each.  Flowsheet Row PULMONARY REHAB OTHER RESPIRATORY from 04/30/2024 in [facility] for Heart, Vascular, & Lung Health  Date 04/30/24  Educator RT  Instruction Review Code 1- Verbalizes Understanding    Oxygen Safety Group instruction provided by PowerPoint, verbal discussion, and written material to support subject matter. There is an overview of "What is Oxygen" and "Why do we need it".  Instructor also reviews how to create a safe environment for oxygen use, the importance of using oxygen as prescribed, and the risks of  noncompliance. There is a brief discussion on traveling with oxygen and resources the patient may utilize.   Oxygen Use Group instruction provided by PowerPoint, verbal discussion, and written material to discuss how supplemental oxygen is prescribed and different types of oxygen supply systems. Resources for more information are provided.  Flowsheet Row PULMONARY REHAB OTHER RESPIRATORY from 03/12/2024 in [facility] for Heart, Vascular, & Lung Health  Date 03/12/24  Educator RT  Instruction Review Code 1- Verbalizes Understanding    Breathing Techniques Group instruction that is supported by demonstration and informational handouts. Instructor discusses the benefits of pursed lip and diaphragmatic breathing and detailed demonstration on how to perform both.  Flowsheet Row PULMONARY REHAB OTHER RESPIRATORY from 03/19/2024 in [facility] for Heart, Vascular, & Lung Health  Date 03/19/24  Educator RN  Instruction Review  Code 1- Verbalizes Understanding     Risk Factor Reduction Group instruction that is supported by a PowerPoint presentation. Instructor discusses the definition of a risk factor, different risk factors for pulmonary disease, and how the heart and lungs work together. Flowsheet Row PULMONARY REHAB OTHER RESPIRATORY from 04/09/2024 in [facility] for Heart, Vascular, & Lung Health  Date 04/09/24  Educator EP  Instruction Review Code 1- Verbalizes Understanding    Pulmonary Diseases Group instruction provided by PowerPoint, verbal discussion, and written material to support subject matter. Instructor gives an overview of the different type of pulmonary diseases. There is also a discussion on risk factors and symptoms as well as ways to manage the diseases.   Stress and Energy Conservation Group instruction provided by PowerPoint, verbal discussion, and written material to support subject matter.  Instructor gives an overview of stress and the impact it can have on the body. Instructor also reviews ways to reduce stress. There is also a discussion on energy conservation and ways to conserve energy throughout the day. Flowsheet Row PULMONARY REHAB OTHER RESPIRATORY from 03/26/2024 in [facility] for Heart, Vascular, & Lung Health  Date 03/26/24  Educator RN  Instruction Review Code 1- Verbalizes Understanding    Warning Signs and Symptoms Group instruction provided by PowerPoint, verbal discussion, and written material to support subject matter. Instructor reviews warning signs and symptoms of stroke, heart attack, cold and flu. Instructor also reviews ways to prevent the spread of infection. Flowsheet Row PULMONARY REHAB OTHER RESPIRATORY from 04/02/2024 in [facility] for Heart, Vascular, & Lung Health  Date 04/02/24  Educator RN  Instruction Review Code 1- Verbalizes Understanding    Other Education Group or individual verbal, written, or video instructions that support the educational goals of the pulmonary rehab program.    Knowledge Questionnaire Score:  Knowledge Questionnaire Score - 03/02/24 1014       Knowledge Questionnaire Score   Pre Score 17/18          Core Components/Risk Factors/Patient Goals at Admission:  Personal Goals and Risk Factors at Admission - 03/02/24 0935       Core Components/Risk Factors/Patient Goals on Admission    Weight Management Yes;Weight Gain    Intervention Weight Management: Develop a combined nutrition and exercise program designed to reach desired caloric intake, while maintaining appropriate intake of nutrient and fiber, sodium and fats, and appropriate energy expenditure required for the weight goal.;Weight Management: Provide education and appropriate resources to help participant work on and attain dietary goals.;Weight Management/Obesity: Establish reasonable short term and long term  weight goals.;Obesity: Provide education and appropriate resources to help participant work on and attain dietary goals.    Expected Outcomes Short Term: Continue to assess and modify interventions until short term weight is achieved;Long Term: Adherence to nutrition and physical activity/exercise program aimed toward attainment of established weight goal;Weight Gain: Understanding of general recommendations for a high calorie, high protein meal plan that promotes weight gain by distributing calorie intake throughout the day with the consumption for 4-5 meals, snacks, and/or supplements;Understanding recommendations for meals to include 15-35% energy as protein, 25-35% energy from fat, 35-60% energy from carbohydrates, less than 200mg  of dietary cholesterol, 20-35 gm of total fiber daily;Understanding of distribution of calorie intake throughout the day with the consumption of 4-5 meals/snacks    Improve shortness of breath with ADL's Yes    Intervention Provide education, individualized exercise plan and daily activity instruction to  help decrease symptoms of SOB with activities of daily living.    Expected Outcomes Short Term: Improve cardiorespiratory fitness to achieve a reduction of symptoms when performing ADLs;Long Term: Be able to perform more ADLs without symptoms or delay the onset of symptoms          Core Components/Risk Factors/Patient Goals Review:   Goals and Risk Factor Review     Row Name 03/02/24 1443 04/01/24 1123 04/28/24 1537 05/21/24 1519       Core Components/Risk Factors/Patient Goals Review   Personal Goals Review Weight Management/Obesity;Improve shortness of breath with ADL's;Develop more efficient breathing techniques such as purse lipped breathing and diaphragmatic breathing and practicing self-pacing with activity. Weight Management/Obesity;Improve shortness of breath with ADL's;Develop more efficient breathing techniques such as purse lipped breathing and diaphragmatic  breathing and practicing self-pacing with activity. Weight Management/Obesity;Improve shortness of breath with ADL's Weight Management/Obesity;Improve shortness of breath with ADL's    Review Mahlet is scheduled to start PR on 03/10/24. Unable to assess her goals at this time. Monthly review of patient's Core Components/Risk Factors/Patient Goals are as follows: Goal progressing for improving shortness of breath with ADL's. Kayde is currently exercising on RA to maintain sats >88%. She is currently exercising on the recumbant bike and the Nustep. Goal met for developing more efficient breathing techniques such as purse lipped breathing and diaphragmatic breathing; and practicing self-pacing with activity. Karrie has attended the breathing techniques education and has been practicing diaphragmatic breathing at home. She is able to demonstrate purse lip breathing when she gets SOB as well as self pace based on the RPE/dyspnea scale. Goal progressing for weight gain. Ethlyn is working with staff dietitian to achieve her weight gain goals. Monthly review of patient's Core Components/Risk Factors/Patient Goals are as follows: Goal progressing for improving shortness of breath with ADL's. Bentley is currently exercising on RA to maintain sats >88%. She is currently exercising on the recumbant bike and the Nustep. Goal progressing for weight gain. Serina is working with staff dietitian to achieve her weight gain goals. Monthly review of patient's Core Components/Risk Factors/Patient Goals are as follows: Goal progressing for improving shortness of breath with ADL's. Unique is currently exercising on RA to maintain sats >88%. She is currently exercising on the recumbant bike and the Nustep. Goal progressing for weight gain. Madyson is working with staff dietitian to achieve her weight gain goals. We will continue to monitor her progress throughout the program.    Expected Outcomes To improve shortness of breath with  ADL's, develop more efficient breathing techniques such as purse lipped breathing and diaphragmatic breathing; and practicing self-pacing with activity and gain weight. To improve shortness of breath with ADL's and gain weight. To improve shortness of breath with ADL's and gain weight. To improve shortness of breath with ADL's and gain weight.       Core Components/Risk Factors/Patient Goals at Discharge (Final Review):   Goals and Risk Factor Review - 05/21/24 1519       Core Components/Risk Factors/Patient Goals Review   Personal Goals Review Weight Management/Obesity;Improve shortness of breath with ADL's    Review Monthly review of patient's Core Components/Risk Factors/Patient Goals are as follows: Goal progressing for improving shortness of breath with ADL's. Biviana is currently exercising on RA to maintain sats >88%. She is currently exercising on the recumbant bike and the Nustep. Goal progressing for weight gain. Ivannah is working with staff dietitian to achieve her weight gain goals. We will continue to monitor her progress  throughout the program.    Expected Outcomes To improve shortness of breath with ADL's and gain weight.          ITP Comments:Pt is making expected progress toward Pulmonary Rehab goals after completing 21 session(s). Recommend continued exercise, life style modification, education, and utilization of breathing techniques to increase stamina and strength, while also decreasing shortness of breath with exertion.  Dr. Slater Staff is Medical Director for Pulmonary Rehab at [facility].

## 2024-06-04 ENCOUNTER — Encounter (HOSPITAL_COMMUNITY)
Admission: RE | Admit: 2024-06-04 | Discharge: 2024-06-04 | Disposition: A | Discharge status: Home / Self Care | Source: Ambulatory Visit | Attending: Thoracic Surgery | Admitting: Thoracic Surgery

## 2024-06-04 DIAGNOSIS — R0602 Shortness of breath: Secondary | ICD-10-CM | POA: Diagnosis not present

## 2024-06-04 DIAGNOSIS — Z79620 Long term (current) use of immunosuppressive biologic: Secondary | ICD-10-CM | POA: Diagnosis not present

## 2024-06-04 DIAGNOSIS — M810 Age-related osteoporosis without current pathological fracture: Secondary | ICD-10-CM | POA: Diagnosis not present

## 2024-06-04 NOTE — Progress Notes (Signed)
 Daily Session Note  Patient Details  Name: Erie Sica MRN: [id number] Date of Birth: 1941/06/28 Referring Provider:   Conrad Ports Pulmonary Rehab Walk Test from 03/02/2024 in [facility]  Referring Provider Murlean Norland    Encounter Date: 06/04/2024  Check In:  Session Check In - 06/04/24 1029       Check-In   Supervising physician immediately available to respond to emergencies [facility] MD immediately available    Physician(s) Rosabel Mose, NP    Location [facility]    Staff Present Johnnie Moats, MS, ACSM-CEP, Exercise Physiologist;Casey Claudene Candia Levin, RN, BSN    Virtual Visit No    Medication changes reported     No    Fall or balance concerns reported    No    Tobacco Cessation No Change    Warm-up and Cool-down Performed as group-led instruction    Resistance Training Performed Yes    VAD Patient? No    PAD/SET Patient? No      Pain Assessment   Currently in Pain? No/denies    Multiple Pain Sites No          Capillary Blood Glucose: No results found for this or any previous visit (from the past 24 hours).    Social History   Tobacco Use  Smoking Status Never  Smokeless Tobacco Never    Goals Met:  Proper associated with RPD/PD & O2 Sat Independence with exercise equipment Exercise tolerated well No report of concerns or symptoms today Strength training completed today  Goals Unmet:  Not Applicable  Comments: Service time is from 1005 to 1137.    Dr. Slater Staff is Medical Director for Pulmonary Rehab at [facility].

## 2024-06-09 ENCOUNTER — Encounter (HOSPITAL_COMMUNITY)
Admission: RE | Admit: 2024-06-09 | Discharge: 2024-06-09 | Disposition: A | Discharge status: Home / Self Care | Source: Ambulatory Visit | Attending: Thoracic Surgery | Admitting: Thoracic Surgery

## 2024-06-09 ENCOUNTER — Other Ambulatory Visit (HOSPITAL_COMMUNITY): Payer: Self-pay | Admitting: *Deleted

## 2024-06-09 DIAGNOSIS — Z79620 Long term (current) use of immunosuppressive biologic: Secondary | ICD-10-CM | POA: Diagnosis not present

## 2024-06-09 DIAGNOSIS — R0602 Shortness of breath: Secondary | ICD-10-CM | POA: Diagnosis not present

## 2024-06-09 DIAGNOSIS — M810 Age-related osteoporosis without current pathological fracture: Secondary | ICD-10-CM | POA: Diagnosis not present

## 2024-06-09 NOTE — Progress Notes (Signed)
 Daily Session Note  Patient Details  Name: Monica White MRN: [id number] Date of Birth: 02/16/1941 Referring Provider:   Conrad Ports Pulmonary Rehab Walk Test from 03/02/2024 in [facility]  Referring Provider Murlean Norland    Encounter Date: 06/09/2024  Check In:  Session Check In - 06/09/24 1115       Check-In   Supervising physician immediately available to respond to emergencies [facility] MD immediately available    Physician(s) Rosabel Mose, NP    [facility]    Staff Present Johnnie Moats, MS, ACSM-CEP, Exercise Physiologist;Brodi Kari Claudene Candia Levin, RN, BSN;Randi Reeve BS, ACSM-CEP, Exercise Physiologist;Samantha Belarus, RD, LDN    Virtual Visit No    Medication changes reported     No    Fall or balance concerns reported    No    Tobacco Cessation No Change    Warm-up and Cool-down Performed as group-led instruction    Resistance Training Performed Yes    VAD Patient? No    PAD/SET Patient? No      Pain Assessment   Currently in Pain? No/denies          Capillary Blood Glucose: No results found for this or any previous visit (from the past 24 hours).    Social History   Tobacco Use  Smoking Status Never  Smokeless Tobacco Never    Goals Met:  Proper associated with RPD/PD & O2 Sat Independence with exercise equipment Exercise tolerated well No report of concerns or symptoms today Strength training completed today  Goals Unmet:  Not Applicable  Comments: Service time is from 1016 to 1139.    Dr. Slater Staff is Medical Director for Pulmonary Rehab at [facility].

## 2024-06-10 ENCOUNTER — Encounter (HOSPITAL_COMMUNITY)
Admission: RE | Admit: 2024-06-10 | Discharge: 2024-06-10 | Disposition: A | Discharge status: Home / Self Care | Payer: Medicare Other | Source: Ambulatory Visit | Attending: Internal Medicine

## 2024-06-10 DIAGNOSIS — R0602 Shortness of breath: Secondary | ICD-10-CM | POA: Diagnosis not present

## 2024-06-10 DIAGNOSIS — Z79620 Long term (current) use of immunosuppressive biologic: Secondary | ICD-10-CM | POA: Diagnosis not present

## 2024-06-10 DIAGNOSIS — M810 Age-related osteoporosis without current pathological fracture: Secondary | ICD-10-CM | POA: Diagnosis not present

## 2024-06-10 MED ORDER — DENOSUMAB 60 MG/ML ~~LOC~~ SOSY
PREFILLED_SYRINGE | SUBCUTANEOUS | Status: AC
  Filled 2024-06-10: qty 1

## 2024-06-10 MED ORDER — INCLISIRAN SODIUM 284 MG/1.5ML ~~LOC~~ SOSY
PREFILLED_SYRINGE | SUBCUTANEOUS | Status: AC
  Filled 2024-06-10: qty 1.5

## 2024-06-10 MED ORDER — INCLISIRAN SODIUM 284 MG/1.5ML ~~LOC~~ SOSY
284.0000 mg | PREFILLED_SYRINGE | Freq: Once | SUBCUTANEOUS | Status: AC
  Administered 2024-06-10: 284 mg via SUBCUTANEOUS

## 2024-06-10 MED ORDER — DENOSUMAB 60 MG/ML ~~LOC~~ SOSY
60.0000 mg | PREFILLED_SYRINGE | Freq: Once | SUBCUTANEOUS | Status: AC
  Administered 2024-06-10: 60 mg via SUBCUTANEOUS

## 2024-06-11 ENCOUNTER — Encounter (HOSPITAL_COMMUNITY)
Admission: RE | Admit: 2024-06-11 | Discharge: 2024-06-11 | Disposition: A | Discharge status: Home / Self Care | Source: Ambulatory Visit | Attending: Thoracic Surgery | Admitting: Thoracic Surgery

## 2024-06-11 ENCOUNTER — Encounter (HOSPITAL_COMMUNITY): Payer: Self-pay

## 2024-06-11 DIAGNOSIS — R0602 Shortness of breath: Secondary | ICD-10-CM | POA: Diagnosis not present

## 2024-06-11 DIAGNOSIS — M810 Age-related osteoporosis without current pathological fracture: Secondary | ICD-10-CM | POA: Diagnosis not present

## 2024-06-11 DIAGNOSIS — Z79620 Long term (current) use of immunosuppressive biologic: Secondary | ICD-10-CM | POA: Diagnosis not present

## 2024-06-11 NOTE — Progress Notes (Signed)
 Daily Session Note  Patient Details  Name: Monica White MRN: [id number] Date of Birth: May 21, 1941 Referring Provider:   Conrad Ports Pulmonary Rehab Walk Test from 03/02/2024 in [facility]  Referring Provider Murlean Norland    Encounter Date: 06/11/2024  Check In:  Session Check In - 06/11/24 1029       Check-In   Supervising physician immediately available to respond to emergencies [facility] MD immediately available    Physician(s) Lum Louis, NP    Location [facility]    Staff Present Johnnie Moats, MS, ACSM-CEP, Exercise Physiologist;Casey Claudene Candia Levin, RN, BSN;Samantha Belarus, RD, LDN;Carlette Bernett, RN, BSN    Virtual Visit No    Medication changes reported     No    Fall or balance concerns reported    No    Tobacco Cessation No Change    Warm-up and Cool-down Performed as group-led Writer Performed Yes    VAD Patient? No    PAD/SET Patient? No      Pain Assessment   Currently in Pain? No/denies    Multiple Pain Sites No          Capillary Blood Glucose: No results found for this or any previous visit (from the past 24 hours).    Social History   Tobacco Use  Smoking Status Never  Smokeless Tobacco Never    Goals Met:  Independence with exercise equipment Exercise tolerated well No report of concerns or symptoms today Strength training completed today  Goals Unmet:  Not Applicable  Comments: Service time is from 1008 to 1126    Dr. Slater Staff is Medical Director for Pulmonary Rehab at [facility].

## 2024-06-12 NOTE — Progress Notes (Signed)
 Discharge Progress Report  Patient Details  Name: Monica White MRN: [id number] Date of Birth: 10-Dec-1940 Referring Provider:   [facility] Walk Test from 03/02/2024 in [facility]  Referring Provider Klapper  [Ellison]     Number of Visits: 24  Reason for Discharge:  Patient reached a stable level of exercise. Patient independent in their exercise. Patient has met program and personal goals.  Smoking History:  Social History   Tobacco Use  Smoking Status Never  Smokeless Tobacco Never    Diagnosis:  Shortness of breath  ADL [facility]SD:  Pulmonary Assessment Scores     Row Name 03/02/24 1013 06/09/24 1213       ADL UCSD   ADL Phase Entry Exit    SOB Score total 63 38      CAT Score   CAT Score 13 3      mMRC Score   mMRC Score 4 2       Initial Exercise Prescription:  Initial Exercise Prescription - 03/02/24 1000       Date of Initial Exercise RX and Referring Provider   Date 03/02/24    Referring Provider Murlean Staff   Expected Discharge Date 05/28/24      Recumbant Bike   Level 1    RPM 20    Watts 15    Minutes 15    METs 1.9      NuStep   Level 1    SPM 72    Minutes 15    METs 1.5      Prescription Details   Frequency (times per week) 2    Duration Progress to 30 minutes of continuous aerobic without signs/symptoms of physical distress      Intensity   THRR 40-80% of Max Heartrate 55-110    Ratings of Perceived Exertion 11-13    Perceived Dyspnea 0-4      Progression   Progression Continue progressive overload as per policy without signs/symptoms or physical distress.      Resistance Training   Training Prescription Yes    Weight red bands    Reps 10-15          Discharge Exercise Prescription (Final Exercise Prescription Changes):  Exercise Prescription Changes - 06/02/24 1200       Response to Exercise   Blood Pressure (Admit) 112/72    Blood  Pressure (Exercise) 140/78    Blood Pressure (Exit) 104/64    Heart Rate (Admit) 74 bpm    Heart Rate (Exercise) 98 bpm    Heart Rate (Exit) 76 bpm    Oxygen Saturation (Admit) 96 %    Oxygen Saturation (Exercise) 97 %    Oxygen Saturation (Exit) 94 %    Rating of Perceived Exertion (Exercise) 11    Perceived Dyspnea (Exercise) 1    Duration Continue with 30 min of aerobic exercise without signs/symptoms of physical distress.    Intensity THRR unchanged      Progression   Progression Continue to progress workloads to maintain intensity without signs/symptoms of physical distress.      Resistance Training   Training Prescription Yes    Weight BLUE BANDS    Reps 10-15    Time 10 Minutes      Recumbant Bike   Level 3    Minutes 15    METs 3.4      NuStep   Level 4    SPM 68  Minutes 15    METs 2.5          Functional Capacity:  6 Minute Walk     Row Name 03/02/24 1038 06/09/24 1529       6 Minute Walk   Phase Initial Discharge    Distance 680 feet 1125 feet    Distance % Change -- 65.44 %    Distance Feet Change -- 445 ft    Walk Time 6 minutes 6 minutes    # of Rest Breaks 1  2:15-3:30 0    MPH 1.29 2.13    METS 1.4 2.37    RPE 13 13    Perceived Dyspnea  1 1    VO2 Peak 4.88 8.29    Symptoms No No    Resting HR 82 bpm 78 bpm    Resting BP 106/70 116/66    Resting Oxygen Saturation  98 % 96 %    Exercise Oxygen Saturation  during 6 min walk 93 % 96 %    Max Ex. HR 125 bpm 112 bpm    Max Ex. BP 104/70 144/80    2 Minute Post BP 108/70 128/70      Interval HR   1 Minute HR 108 75    2 Minute HR 109 76    3 Minute HR 110 75    4 Minute HR 114 80    5 Minute HR 123 75    6 Minute HR 125 112    2 Minute Post HR 103 96    Interval Heart Rate? Yes Yes  inaccurate readings      Interval Oxygen   Interval Oxygen? Yes Yes    Baseline Oxygen Saturation % 98 % 96 %    1 Minute Oxygen Saturation % 97 % 96 %    1 Minute Liters of Oxygen 0 L 0 L    2  Minute Oxygen Saturation % 93 % 98 %    2 Minute Liters of Oxygen 0 L 0 L    3 Minute Oxygen Saturation % 98 % 99 %    3 Minute Liters of Oxygen 0 L 0 L    4 Minute Oxygen Saturation % 99 % 98 %    4 Minute Liters of Oxygen 0 L 0 L    5 Minute Oxygen Saturation % 98 % 99 %    5 Minute Liters of Oxygen 0 L 0 L    6 Minute Oxygen Saturation % 96 % 98 %    6 Minute Liters of Oxygen 0 L 0 L    2 Minute Post Oxygen Saturation % 96 % 100 %    2 Minute Post Liters of Oxygen 0 L 0 L       Psychological, QOL, Others - Outcomes: PHQ 2/9:    06/09/2024   12:12 PM 03/02/2024   10:15 AM  Depression screen PHQ 2/9  Decreased Interest 0 0  Down, Depressed, Hopeless 0 0  PHQ - 2 Score 0 0  Altered sleeping 0 2  Tired, decreased energy 0 1  Change in appetite 0 1  Feeling bad or failure about yourself  0 0  Trouble concentrating 0 1  Moving slowly or fidgety/restless 0 0  Suicidal thoughts 0 0  PHQ-9 Score 0 5  Difficult doing work/chores  Somewhat difficult    Quality of Life:   Personal Goals: Goals established at orientation with interventions provided to work toward goal.  Personal Goals and Risk  Factors at Admission - 03/02/24 0935       Core Components/Risk Factors/Patient Goals on Admission    Weight Management Yes;Weight Gain    Intervention Weight Management: Develop a combined nutrition and exercise program designed to reach desired caloric intake, while maintaining appropriate intake of nutrient and fiber, sodium and fats, and appropriate energy expenditure required for the weight goal.;Weight Management: Provide education and appropriate resources to help participant work on and attain dietary goals.;Weight Management/Obesity: Establish reasonable short term and long term weight goals.;Obesity: Provide education and appropriate resources to help participant work on and attain dietary goals.    Expected Outcomes Short Term: Continue to assess and modify interventions until short  term weight is achieved;Long Term: Adherence to nutrition and physical activity/exercise program aimed toward attainment of established weight goal;Weight Gain: Understanding of general recommendations for a high calorie, high protein meal plan that promotes weight gain by distributing calorie intake throughout the day with the consumption for 4-5 meals, snacks, and/or supplements;Understanding recommendations for meals to include 15-35% energy as protein, 25-35% energy from fat, 35-60% energy from carbohydrates, less than 200mg  of dietary cholesterol, 20-35 gm of total fiber daily;Understanding of distribution of calorie intake throughout the day with the consumption of 4-5 meals/snacks    Improve shortness of breath with ADL's Yes    Intervention Provide education, individualized exercise plan and daily activity instruction to help decrease symptoms of SOB with activities of daily living.    Expected Outcomes Short Term: Improve cardiorespiratory fitness to achieve a reduction of symptoms when performing ADLs;Long Term: Be able to perform more ADLs without symptoms or delay the onset of symptoms           Personal Goals Discharge:  Goals and Risk Factor Review     Row Name 03/02/24 1443 04/01/24 1123 04/28/24 1537 05/21/24 1519 06/12/24 0840     Core Components/Risk Factors/Patient Goals Review   Personal Goals Review Weight Management/Obesity;Improve shortness of breath with ADL's;Develop more efficient breathing techniques such as purse lipped breathing and diaphragmatic breathing and practicing self-pacing with activity. Weight Management/Obesity;Improve shortness of breath with ADL's;Develop more efficient breathing techniques such as purse lipped breathing and diaphragmatic breathing and practicing self-pacing with activity. Weight Management/Obesity;Improve shortness of breath with ADL's Weight Management/Obesity;Improve shortness of breath with ADL's Weight Management/Obesity;Improve  shortness of breath with ADL's   Review Keylie is scheduled to start PR on 03/10/24. Unable to assess her goals at this time. Monthly review of patient's Core Components/Risk Factors/Patient Goals are as follows: Goal progressing for improving shortness of breath with ADL's. Aalayah is currently exercising on RA to maintain sats >88%. She is currently exercising on the recumbant bike and the Nustep. Goal met for developing more efficient breathing techniques such as purse lipped breathing and diaphragmatic breathing; and practicing self-pacing with activity. Pinki has attended the breathing techniques education and has been practicing diaphragmatic breathing at home. She is able to demonstrate purse lip breathing when she gets SOB as well as self pace based on the RPE/dyspnea scale. Goal progressing for weight gain. Sakia is working with staff dietitian to achieve her weight gain goals. Monthly review of patient's Core Components/Risk Factors/Patient Goals are as follows: Goal progressing for improving shortness of breath with ADL's. Ashlin is currently exercising on RA to maintain sats >88%. She is currently exercising on the recumbant bike and the Nustep. Goal progressing for weight gain. Jarae is working with staff dietitian to achieve her weight gain goals. Monthly review of patient's Core Components/Risk  Factors/Patient Goals are as follows: Goal progressing for improving shortness of breath with ADL's. Catlynn is currently exercising on RA to maintain sats >88%. She is currently exercising on the recumbant bike and the Nustep. Goal progressing for weight gain. Yennifer is working with staff dietitian to achieve her weight gain goals. We will continue to monitor her progress throughout the program. Elveria discharged from the program on 06/11/24 completing 24 sessions. Discharge review of patient's Core Components/Risk Factors/Patient Goals are as follows: Goal met for improving shortness of breath with  ADL's. Torianna exercised on RA to maintain sats >88%. Her discharge shortness of breath score decreased from 63 to 38, her CAT score decreased from 13 to 4, and her MMRC decreased from a 4 to a 2. She stated she can feel a difference with her ADLs at home. Goal met for weight gain. Latoyna worked with the staff dietitian to achieve her weight gain goals. She was up 3.9# since starting the program. She will continue using the advice of our dietitian post Pulm Rehab.   Expected Outcomes To improve shortness of breath with ADL's, develop more efficient breathing techniques such as purse lipped breathing and diaphragmatic breathing; and practicing self-pacing with activity and gain weight. To improve shortness of breath with ADL's and gain weight. To improve shortness of breath with ADL's and gain weight. To improve shortness of breath with ADL's and gain weight. To continue to improve her shortness of breath with ADL's and gain weight post Pulm Rehab      Exercise Goals and Review:  Exercise Goals     Row Name 03/02/24 0927             Exercise Goals   Increase Physical Activity Yes       Intervention Develop an individualized exercise prescription for aerobic and resistive training based on initial evaluation findings, risk stratification, comorbidities and participant's personal goals.;Provide advice, education, support and counseling about physical activity/exercise needs.       Expected Outcomes Short Term: Attend rehab on a regular basis to increase amount of physical activity.;Long Term: Exercising regularly at least 3-5 days a week.;Long Term: Add in home exercise to make exercise part of routine and to increase amount of physical activity.       Increase Strength and Stamina Yes       Intervention Provide advice, education, support and counseling about physical activity/exercise needs.;Develop an individualized exercise prescription for aerobic and resistive training based on initial  evaluation findings, risk stratification, comorbidities and participant's personal goals.       Expected Outcomes Short Term: Increase workloads from initial exercise prescription for resistance, speed, and METs.;Short Term: Perform resistance training exercises routinely during rehab and add in resistance training at home;Long Term: Improve cardiorespiratory fitness, muscular endurance and strength as measured by increased METs and functional capacity ( )       Able to understand and use rate of perceived exertion (RPE) scale Yes       Intervention Provide education and explanation on how to use RPE scale       Expected Outcomes Short Term: Able to use RPE daily in rehab to express subjective intensity level;Long Term:  Able to use RPE to guide intensity level when exercising independently       Able to understand and use Dyspnea scale Yes       Intervention Provide education and explanation on how to use Dyspnea scale       Expected Outcomes Short Term: Able to  use Dyspnea scale daily in rehab to express subjective sense of shortness of breath during exertion;Long Term: Able to use Dyspnea scale to guide intensity level when exercising independently       Knowledge and understanding of Target Heart Rate Range (THRR) Yes       Intervention Provide education and explanation of THRR including how the numbers were predicted and where they are located for reference       Expected Outcomes Short Term: Able to state/look up THRR;Short Term: Able to use daily as guideline for intensity in rehab;Long Term: Able to use THRR to govern intensity when exercising independently       Understanding of Exercise Prescription Yes       Intervention Provide education, explanation, and written materials on patient's individual exercise prescription       Expected Outcomes Short Term: Able to explain program exercise prescription;Long Term: Able to explain home exercise prescription to exercise independently           Exercise Goals Re-Evaluation:  Exercise Goals Re-Evaluation     Row Name 03/04/24 1108 04/03/24 0910 04/29/24 0943 06/01/24 0938       Exercise Goal Re-Evaluation   Exercise Goals Review Increase Physical Activity;Able to understand and use Dyspnea scale;Understanding of Exercise Prescription;Increase Strength and Stamina;Knowledge and understanding of Target Heart Rate Range (THRR);Able to understand and use rate of perceived exertion (RPE) scale Increase Physical Activity;Able to understand and use Dyspnea scale;Understanding of Exercise Prescription;Increase Strength and Stamina;Knowledge and understanding of Target Heart Rate Range (THRR);Able to understand and use rate of perceived exertion (RPE) scale Increase Physical Activity;Able to understand and use Dyspnea scale;Understanding of Exercise Prescription;Increase Strength and Stamina;Knowledge and understanding of Target Heart Rate Range (THRR);Able to understand and use rate of perceived exertion (RPE) scale Increase Physical Activity;Able to understand and use Dyspnea scale;Understanding of Exercise Prescription;Increase Strength and Stamina;Knowledge and understanding of Target Heart Rate Range (THRR);Able to understand and use rate of perceived exertion (RPE) scale    Comments Kinslei is scheduled to begin exercise on 4/15. Will continue to monitor and progress as able. Zoee has completed 7 exercise sessions. She exercises for 15 min on the recumbent bike and Nustep. She averages 3.4 METs at level 3 on the recumbent elliptical and 2.2 METs at level 3 on the Nustep. Cinderella performs the warmup and cooldown standing without limitations. Lyan has increased her level for both exercise modes several times. She tolerates progressions well. Will continue to monitor and progress as able. Nene has completed 13 exercise sessions. She exercises for 15 min on the recumbent bike and Nustep. She averages 3.1 METs at level 3 on the recumbent bike  and 2.1 METs at level 3 on the Nustep. Jyla performs the warmup and cooldown standing without limitations. Alexine has been difficult to progress recently due to her taking time off for vacations. We have recently discussed home exercise as Serina is currently exercising at home. Will continue to monitor and progress as able. Petrea has completed 20 exercise sessions. She exercises for 15 min on the recumbent bike and Nustep. She averages 3.6 METs at level 3 on the recumbent bike and 2.8 METs at level 5 on the Nustep. Olean performs the warmup and cooldown standing without limitations. Tondra has increased her level on the Nustep as METs have increased slightly. Her METs have also increased on the recumbent bike despite no change in level. Serafina has missed a couple of exercise sessions due to a vacation. Will continue to  monitor and progress as able.    Expected Outcomes Through exercise at rehab and home, the patient will decrease shortness of breath and feel confident in carrying out an exercise regimen at home. Through exercise at rehab and home, the patient will decrease shortness of breath and feel confident in carrying out an exercise regimen at home. Through exercise at rehab and home, the patient will decrease shortness of breath and feel confident in carrying out an exercise regimen at home. Through exercise at rehab and home, the patient will decrease shortness of breath and feel confident in carrying out an exercise regimen at home.       Nutrition & Weight - Outcomes:  Pre Biometrics - 03/02/24 0916       Pre Biometrics   Grip Strength 15 kg           Nutrition:  Nutrition Therapy & Goals - 06/11/24 1037       Nutrition Therapy   Diet General Healthy Diet      Personal Nutrition Goals   Nutrition Goal Patient to improve diet quality by using the plate method as a guide for meal planning to include lean protein/plant protein, fruits, vegetables, whole grains, nonfat dairy  as part of a well-balanced diet.   goal in progress.   Personal Goal #2 Patient to identify strategies for weight gain of 0.5-2.0# per week.   goal in progress.   Comments Goals in progress. Aviendha has medical history of shortness of breath, mulitple hiatal hernia repair, gtube removal 01/2023. She did recently have another hernia surgery in 12/2023 and was discharged to SNF on a full liquid diet. At this time, she has advanced to a regular diet. She is motivated to gain weight and she is up 4# since starting with our program. We have discussed multiple strategies for weight gain including eating frequency, nutrition supplements,  protein drinks, etc. She typically drinks one protein drink daily. Patient will continue to benefit from adherence to nutrition, exercise, and lifestyle modifications.      Intervention Plan   Intervention Prescribe, educate and counsel regarding individualized specific dietary modifications aiming towards targeted core components such as weight, hypertension, lipid management, diabetes, heart failure and other comorbidities.;Nutrition handout(s) given to patient.    Expected Outcomes Short Term Goal: Understand basic principles of dietary content, such as calories, fat, sodium, cholesterol and nutrients.;Long Term Goal: Adherence to prescribed nutrition plan.          Nutrition Discharge:   Education Questionnaire Score:  Knowledge Questionnaire Score - 06/09/24 1212       Knowledge Questionnaire Score   Pre Score 17/18    Post Score 14/18         Ibeth discharged from the program on 06/11/24 completing 24 sessions. Malary denied any psychosocial barriers or concerns at discharge. She is still compliant with taking her psychotropic meds. She declines any needs or resources.   Discharge review of patient's Core Components/Risk Factors/Patient Goals are as follows: Goal met for improving shortness of breath with ADL's. Charlott exercised on RA to maintain sats  >88%. Her discharge shortness of breath score decreased from 63 to 38, her CAT score decreased from 13 to 4, and her MMRC decreased from a 4 to a 2. She stated she can feel a difference with her ADLs at home. Goal met for weight gain. Delecia worked with the staff dietitian to achieve her weight gain goals. She was up 3.9# since starting the program. She will continue using  the advice of our dietitian post Pulm Rehab.  Goals reviewed with patient; copy given to patient.

## 2024-08-11 ENCOUNTER — Other Ambulatory Visit (HOSPITAL_COMMUNITY): Payer: Self-pay | Admitting: Internal Medicine

## 2024-08-11 DIAGNOSIS — M810 Age-related osteoporosis without current pathological fracture: Secondary | ICD-10-CM | POA: Insufficient documentation

## 2024-08-11 DIAGNOSIS — E785 Hyperlipidemia, unspecified: Secondary | ICD-10-CM | POA: Insufficient documentation

## 2024-08-25 DIAGNOSIS — Z23 Encounter for immunization: Secondary | ICD-10-CM | POA: Diagnosis not present

## 2024-09-02 DIAGNOSIS — M170 Bilateral primary osteoarthritis of knee: Secondary | ICD-10-CM | POA: Diagnosis not present

## 2024-09-17 DIAGNOSIS — Z85828 Personal history of other malignant neoplasm of skin: Secondary | ICD-10-CM | POA: Diagnosis not present

## 2024-09-17 DIAGNOSIS — C4441 Basal cell carcinoma of skin of scalp and neck: Secondary | ICD-10-CM | POA: Diagnosis not present

## 2024-09-17 DIAGNOSIS — C44612 Basal cell carcinoma of skin of right upper limb, including shoulder: Secondary | ICD-10-CM | POA: Diagnosis not present

## 2024-09-17 DIAGNOSIS — L820 Inflamed seborrheic keratosis: Secondary | ICD-10-CM | POA: Diagnosis not present

## 2024-09-17 DIAGNOSIS — D485 Neoplasm of uncertain behavior of skin: Secondary | ICD-10-CM | POA: Diagnosis not present

## 2024-09-17 DIAGNOSIS — L570 Actinic keratosis: Secondary | ICD-10-CM | POA: Diagnosis not present

## 2024-09-17 DIAGNOSIS — C44319 Basal cell carcinoma of skin of other parts of face: Secondary | ICD-10-CM | POA: Diagnosis not present

## 2024-10-27 DIAGNOSIS — Z85828 Personal history of other malignant neoplasm of skin: Secondary | ICD-10-CM | POA: Diagnosis not present

## 2024-10-27 DIAGNOSIS — C44612 Basal cell carcinoma of skin of right upper limb, including shoulder: Secondary | ICD-10-CM | POA: Diagnosis not present

## 2024-12-14 ENCOUNTER — Inpatient Hospital Stay (HOSPITAL_COMMUNITY): Admission: RE | Admit: 2024-12-14 | Source: Ambulatory Visit

## 2024-12-23 ENCOUNTER — Telehealth (HOSPITAL_COMMUNITY): Payer: Self-pay

## 2024-12-23 NOTE — Telephone Encounter (Signed)
 Auth Submission: NO AUTH NEEDED Site of care: Site of care: [facility] Payer: Medicare A/B, BCBS Supplement Medication & CPT/J Code(s) submitted: Prolia  (Denosumab ) [id number] Diagnosis Code: M81.0 Route of submission (phone, fax, portal):  Phone # Fax # Auth type: Buy/Bill HB Units/visits requested: 60mg  q59months Reference number:  Approval from: 12/26/24 to 12/26/25

## 2024-12-23 NOTE — Telephone Encounter (Signed)
 Auth Submission: NO AUTH NEEDED Site of care: Site of care: [facility] Payer: Medicare A/B, BCBS Supplement Medication & CPT/J Code(s) submitted: Leqvio  (Inclisiran) J1306 Diagnosis Code: E78.5 Route of submission (phone, fax, portal):  Phone # Fax # Auth type: Buy/Bill HB Units/visits requested: 284mg  q79months Reference number:  Approval from: 12/26/24 to 12/26/25

## 2024-12-31 ENCOUNTER — Inpatient Hospital Stay (HOSPITAL_COMMUNITY)
Admission: RE | Admit: 2024-12-31 | Discharge: 2024-12-31 | Disposition: A | Source: Ambulatory Visit | Attending: Internal Medicine

## 2024-12-31 ENCOUNTER — Encounter (HOSPITAL_COMMUNITY)
Admission: RE | Admit: 2024-12-31 | Discharge: 2024-12-31 | Disposition: A | Source: Ambulatory Visit | Attending: Internal Medicine

## 2024-12-31 VITALS — BP 114/62 | HR 77 | Temp 97.2°F | Resp 16

## 2024-12-31 DIAGNOSIS — M810 Age-related osteoporosis without current pathological fracture: Secondary | ICD-10-CM

## 2024-12-31 DIAGNOSIS — E785 Hyperlipidemia, unspecified: Secondary | ICD-10-CM

## 2024-12-31 MED ORDER — DENOSUMAB 60 MG/ML ~~LOC~~ SOSY
60.0000 mg | PREFILLED_SYRINGE | Freq: Once | SUBCUTANEOUS | Status: AC
  Administered 2024-12-31: 60 mg via SUBCUTANEOUS

## 2024-12-31 MED ORDER — INCLISIRAN SODIUM 284 MG/1.5ML ~~LOC~~ SOSY
PREFILLED_SYRINGE | SUBCUTANEOUS | Status: AC
  Filled 2024-12-31: qty 1.5

## 2024-12-31 MED ORDER — INCLISIRAN SODIUM 284 MG/1.5ML ~~LOC~~ SOSY
284.0000 mg | PREFILLED_SYRINGE | Freq: Once | SUBCUTANEOUS | Status: AC
  Administered 2024-12-31: 284 mg via SUBCUTANEOUS

## 2024-12-31 MED ORDER — DENOSUMAB 60 MG/ML ~~LOC~~ SOSY
PREFILLED_SYRINGE | SUBCUTANEOUS | Status: AC
  Filled 2024-12-31: qty 1

## 2025-06-14 ENCOUNTER — Encounter (HOSPITAL_COMMUNITY)

## 2025-07-01 ENCOUNTER — Encounter (HOSPITAL_COMMUNITY)
